# Patient Record
Sex: Male | Born: 1950 | Race: White | Hispanic: No | Smoking: Former smoker
Health system: Southern US, Community
[De-identification: ages and names within clinical notes are randomized; demographics above are authoritative.]

## PROBLEM LIST (undated history)

## (undated) DIAGNOSIS — S58111A Complete traumatic amputation at level between elbow and wrist, right arm, initial encounter: Secondary | ICD-10-CM

## (undated) DIAGNOSIS — E119 Type 2 diabetes mellitus without complications: Secondary | ICD-10-CM

## (undated) DIAGNOSIS — G8929 Other chronic pain: Secondary | ICD-10-CM

## (undated) DIAGNOSIS — M545 Low back pain, unspecified: Secondary | ICD-10-CM

## (undated) DIAGNOSIS — F209 Schizophrenia, unspecified: Secondary | ICD-10-CM

## (undated) DIAGNOSIS — I10 Essential (primary) hypertension: Secondary | ICD-10-CM

## (undated) DIAGNOSIS — B1920 Unspecified viral hepatitis C without hepatic coma: Secondary | ICD-10-CM

## (undated) DIAGNOSIS — W19XXXA Unspecified fall, initial encounter: Secondary | ICD-10-CM

## (undated) DIAGNOSIS — R296 Repeated falls: Secondary | ICD-10-CM

## (undated) DIAGNOSIS — R29898 Other symptoms and signs involving the musculoskeletal system: Secondary | ICD-10-CM

## (undated) DIAGNOSIS — G629 Polyneuropathy, unspecified: Secondary | ICD-10-CM

## (undated) HISTORY — DX: Essential (primary) hypertension: I10

## (undated) HISTORY — PX: SPLENECTOMY: SUR1306

## (undated) HISTORY — PX: TOE SURGERY: SHX1073

## (undated) HISTORY — PX: SHOULDER FUSION: SUR625

## (undated) HISTORY — DX: Polyneuropathy, unspecified: G62.9

## (undated) HISTORY — PX: ELBOW SURGERY: SHX618

## (undated) HISTORY — DX: Other chronic pain: G89.29

## (undated) HISTORY — PX: ARM AMPUTATION: SUR21

## (undated) HISTORY — DX: Unspecified viral hepatitis C without hepatic coma: B19.20

## (undated) HISTORY — PX: JOINT REPLACEMENT: SHX530

## (undated) HISTORY — PX: ABDOMINAL SURGERY: SHX537

---

## 2003-12-13 ENCOUNTER — Other Ambulatory Visit: Payer: Self-pay

## 2003-12-14 ENCOUNTER — Other Ambulatory Visit: Payer: Self-pay

## 2004-07-05 ENCOUNTER — Other Ambulatory Visit: Payer: Self-pay

## 2004-07-12 ENCOUNTER — Inpatient Hospital Stay: Payer: Self-pay | Admitting: Unknown Physician Specialty

## 2004-12-03 ENCOUNTER — Other Ambulatory Visit: Payer: Self-pay

## 2004-12-10 ENCOUNTER — Inpatient Hospital Stay: Payer: Self-pay | Admitting: Unknown Physician Specialty

## 2005-05-29 ENCOUNTER — Other Ambulatory Visit: Payer: Self-pay

## 2005-06-03 ENCOUNTER — Inpatient Hospital Stay: Payer: Self-pay | Admitting: Unknown Physician Specialty

## 2006-08-27 ENCOUNTER — Emergency Department (HOSPITAL_COMMUNITY): Admission: EM | Admit: 2006-08-27 | Discharge: 2006-08-27 | Payer: Self-pay | Admitting: Family Medicine

## 2006-12-04 ENCOUNTER — Emergency Department (HOSPITAL_COMMUNITY): Admission: EM | Admit: 2006-12-04 | Discharge: 2006-12-04 | Payer: Self-pay | Admitting: Emergency Medicine

## 2008-07-21 ENCOUNTER — Ambulatory Visit: Payer: Self-pay | Admitting: Unknown Physician Specialty

## 2008-07-25 ENCOUNTER — Ambulatory Visit: Payer: Self-pay | Admitting: Unknown Physician Specialty

## 2008-11-13 ENCOUNTER — Emergency Department: Payer: Self-pay | Admitting: Emergency Medicine

## 2008-11-14 ENCOUNTER — Ambulatory Visit: Payer: Self-pay | Admitting: Gastroenterology

## 2008-12-18 ENCOUNTER — Ambulatory Visit: Payer: Self-pay | Admitting: Internal Medicine

## 2008-12-29 ENCOUNTER — Ambulatory Visit: Payer: Self-pay | Admitting: Gastroenterology

## 2009-01-12 ENCOUNTER — Ambulatory Visit: Payer: Self-pay | Admitting: Internal Medicine

## 2009-01-18 ENCOUNTER — Ambulatory Visit: Payer: Self-pay | Admitting: Internal Medicine

## 2009-03-13 ENCOUNTER — Emergency Department: Payer: Self-pay | Admitting: Emergency Medicine

## 2009-09-19 ENCOUNTER — Ambulatory Visit: Payer: Self-pay | Admitting: Gastroenterology

## 2009-09-19 LAB — HM COLONOSCOPY

## 2010-06-27 ENCOUNTER — Emergency Department: Payer: Self-pay | Admitting: Emergency Medicine

## 2011-02-12 ENCOUNTER — Emergency Department: Payer: Self-pay | Admitting: Emergency Medicine

## 2011-05-23 DIAGNOSIS — G5600 Carpal tunnel syndrome, unspecified upper limb: Secondary | ICD-10-CM | POA: Diagnosis not present

## 2011-05-31 DIAGNOSIS — G5600 Carpal tunnel syndrome, unspecified upper limb: Secondary | ICD-10-CM | POA: Diagnosis not present

## 2011-06-12 DIAGNOSIS — G5600 Carpal tunnel syndrome, unspecified upper limb: Secondary | ICD-10-CM | POA: Diagnosis not present

## 2011-06-12 DIAGNOSIS — M25549 Pain in joints of unspecified hand: Secondary | ICD-10-CM | POA: Diagnosis not present

## 2011-07-22 DIAGNOSIS — M2550 Pain in unspecified joint: Secondary | ICD-10-CM | POA: Diagnosis not present

## 2011-08-22 DIAGNOSIS — G5600 Carpal tunnel syndrome, unspecified upper limb: Secondary | ICD-10-CM | POA: Diagnosis not present

## 2011-10-08 DIAGNOSIS — F29 Unspecified psychosis not due to a substance or known physiological condition: Secondary | ICD-10-CM | POA: Diagnosis not present

## 2011-10-08 DIAGNOSIS — F308 Other manic episodes: Secondary | ICD-10-CM | POA: Diagnosis not present

## 2011-10-08 DIAGNOSIS — G8929 Other chronic pain: Secondary | ICD-10-CM | POA: Diagnosis not present

## 2011-10-08 DIAGNOSIS — I10 Essential (primary) hypertension: Secondary | ICD-10-CM | POA: Diagnosis not present

## 2011-10-08 DIAGNOSIS — Z9119 Patient's noncompliance with other medical treatment and regimen: Secondary | ICD-10-CM | POA: Diagnosis not present

## 2011-10-08 DIAGNOSIS — F200 Paranoid schizophrenia: Secondary | ICD-10-CM | POA: Diagnosis not present

## 2011-10-08 DIAGNOSIS — S48119A Complete traumatic amputation at level between unspecified shoulder and elbow, initial encounter: Secondary | ICD-10-CM | POA: Diagnosis not present

## 2011-10-08 DIAGNOSIS — F259 Schizoaffective disorder, unspecified: Secondary | ICD-10-CM | POA: Diagnosis not present

## 2011-10-21 DIAGNOSIS — M2550 Pain in unspecified joint: Secondary | ICD-10-CM | POA: Diagnosis not present

## 2011-10-23 DIAGNOSIS — F29 Unspecified psychosis not due to a substance or known physiological condition: Secondary | ICD-10-CM | POA: Diagnosis not present

## 2011-10-25 ENCOUNTER — Emergency Department: Payer: Self-pay | Admitting: *Deleted

## 2011-10-25 DIAGNOSIS — R6889 Other general symptoms and signs: Secondary | ICD-10-CM | POA: Diagnosis not present

## 2011-10-25 LAB — COMPREHENSIVE METABOLIC PANEL
Alkaline Phosphatase: 36 U/L — ABNORMAL LOW (ref 50–136)
BUN: 19 mg/dL — ABNORMAL HIGH (ref 7–18)
Co2: 25 mmol/L (ref 21–32)
EGFR (African American): >60
Potassium: 3.3 mmol/L — ABNORMAL LOW (ref 3.5–5.1)
SGOT(AST): 39 U/L — ABNORMAL HIGH (ref 15–37)

## 2011-10-25 LAB — CBC WITH DIFFERENTIAL/PLATELET
Basophil #: 0 10*3/uL (ref 0.0–0.1)
Eosinophil #: 0 10*3/uL (ref 0.0–0.7)
HCT: 38 % — ABNORMAL LOW (ref 40.0–52.0)
Lymphocyte #: 0.4 10*3/uL — ABNORMAL LOW (ref 1.0–3.6)
Monocyte #: 0.9 x10 3/mm (ref 0.2–1.0)
Monocyte %: 3.8 %
Neutrophil #: 23 10*3/uL — ABNORMAL HIGH (ref 1.4–6.5)
Neutrophil %: 94.2 %
Platelet: 241 10*3/uL (ref 150–440)
RDW: 13.2 % (ref 11.5–14.5)
WBC: 24.4 10*3/uL — ABNORMAL HIGH (ref 3.8–10.6)

## 2011-10-25 LAB — URINALYSIS, COMPLETE
Bacteria: NONE SEEN
Blood: NEGATIVE
Ketone: NEGATIVE
Nitrite: NEGATIVE
Ph: 5 (ref 4.5–8.0)
RBC,UR: <1 /HPF (ref 0–5)
WBC UR: NONE SEEN /HPF (ref 0–5)

## 2011-10-25 LAB — TROPONIN I: Troponin-I: <0.02 ng/mL

## 2011-10-28 ENCOUNTER — Emergency Department: Payer: Self-pay | Admitting: Emergency Medicine

## 2011-10-28 DIAGNOSIS — IMO0002 Reserved for concepts with insufficient information to code with codable children: Secondary | ICD-10-CM | POA: Diagnosis not present

## 2011-10-28 DIAGNOSIS — M545 Low back pain: Secondary | ICD-10-CM | POA: Diagnosis not present

## 2011-10-28 DIAGNOSIS — R6889 Other general symptoms and signs: Secondary | ICD-10-CM | POA: Diagnosis not present

## 2011-10-28 DIAGNOSIS — Z79899 Other long term (current) drug therapy: Secondary | ICD-10-CM | POA: Diagnosis not present

## 2011-10-28 DIAGNOSIS — M129 Arthropathy, unspecified: Secondary | ICD-10-CM | POA: Diagnosis not present

## 2011-11-11 DIAGNOSIS — S32009A Unspecified fracture of unspecified lumbar vertebra, initial encounter for closed fracture: Secondary | ICD-10-CM | POA: Diagnosis not present

## 2011-11-11 DIAGNOSIS — M19019 Primary osteoarthritis, unspecified shoulder: Secondary | ICD-10-CM | POA: Diagnosis not present

## 2011-12-09 DIAGNOSIS — M19019 Primary osteoarthritis, unspecified shoulder: Secondary | ICD-10-CM | POA: Diagnosis not present

## 2011-12-09 DIAGNOSIS — S32009A Unspecified fracture of unspecified lumbar vertebra, initial encounter for closed fracture: Secondary | ICD-10-CM | POA: Diagnosis not present

## 2012-01-06 DIAGNOSIS — M75120 Complete rotator cuff tear or rupture of unspecified shoulder, not specified as traumatic: Secondary | ICD-10-CM | POA: Diagnosis not present

## 2012-01-06 DIAGNOSIS — IMO0002 Reserved for concepts with insufficient information to code with codable children: Secondary | ICD-10-CM | POA: Diagnosis not present

## 2012-02-10 DIAGNOSIS — M549 Dorsalgia, unspecified: Secondary | ICD-10-CM | POA: Insufficient documentation

## 2012-02-10 DIAGNOSIS — S22009A Unspecified fracture of unspecified thoracic vertebra, initial encounter for closed fracture: Secondary | ICD-10-CM | POA: Diagnosis not present

## 2012-02-10 DIAGNOSIS — M48061 Spinal stenosis, lumbar region without neurogenic claudication: Secondary | ICD-10-CM | POA: Diagnosis not present

## 2012-02-10 DIAGNOSIS — M5030 Other cervical disc degeneration, unspecified cervical region: Secondary | ICD-10-CM | POA: Diagnosis not present

## 2012-02-10 DIAGNOSIS — M5126 Other intervertebral disc displacement, lumbar region: Secondary | ICD-10-CM | POA: Diagnosis not present

## 2012-02-10 DIAGNOSIS — M5137 Other intervertebral disc degeneration, lumbosacral region: Secondary | ICD-10-CM | POA: Diagnosis not present

## 2012-02-17 DIAGNOSIS — M4800 Spinal stenosis, site unspecified: Secondary | ICD-10-CM | POA: Diagnosis not present

## 2012-02-21 DIAGNOSIS — Z8739 Personal history of other diseases of the musculoskeletal system and connective tissue: Secondary | ICD-10-CM | POA: Diagnosis not present

## 2012-02-21 DIAGNOSIS — S32009A Unspecified fracture of unspecified lumbar vertebra, initial encounter for closed fracture: Secondary | ICD-10-CM | POA: Diagnosis not present

## 2012-02-21 DIAGNOSIS — M549 Dorsalgia, unspecified: Secondary | ICD-10-CM | POA: Diagnosis not present

## 2012-02-26 DIAGNOSIS — M5412 Radiculopathy, cervical region: Secondary | ICD-10-CM | POA: Diagnosis not present

## 2012-02-26 DIAGNOSIS — Z79899 Other long term (current) drug therapy: Secondary | ICD-10-CM | POA: Diagnosis not present

## 2012-02-26 DIAGNOSIS — M47812 Spondylosis without myelopathy or radiculopathy, cervical region: Secondary | ICD-10-CM | POA: Diagnosis not present

## 2012-02-28 DIAGNOSIS — S32009A Unspecified fracture of unspecified lumbar vertebra, initial encounter for closed fracture: Secondary | ICD-10-CM | POA: Diagnosis not present

## 2012-02-28 DIAGNOSIS — M549 Dorsalgia, unspecified: Secondary | ICD-10-CM | POA: Diagnosis not present

## 2012-02-28 DIAGNOSIS — Z8739 Personal history of other diseases of the musculoskeletal system and connective tissue: Secondary | ICD-10-CM | POA: Diagnosis not present

## 2012-03-20 DIAGNOSIS — M545 Low back pain: Secondary | ICD-10-CM | POA: Diagnosis not present

## 2012-03-20 DIAGNOSIS — M542 Cervicalgia: Secondary | ICD-10-CM | POA: Diagnosis not present

## 2012-03-26 DIAGNOSIS — M19019 Primary osteoarthritis, unspecified shoulder: Secondary | ICD-10-CM | POA: Diagnosis not present

## 2012-03-26 DIAGNOSIS — S43429A Sprain of unspecified rotator cuff capsule, initial encounter: Secondary | ICD-10-CM | POA: Diagnosis not present

## 2012-03-26 DIAGNOSIS — M25519 Pain in unspecified shoulder: Secondary | ICD-10-CM | POA: Diagnosis not present

## 2012-03-26 DIAGNOSIS — M259 Joint disorder, unspecified: Secondary | ICD-10-CM | POA: Insufficient documentation

## 2012-06-08 DIAGNOSIS — Z0000 Encounter for general adult medical examination without abnormal findings: Secondary | ICD-10-CM | POA: Diagnosis not present

## 2012-06-08 DIAGNOSIS — I10 Essential (primary) hypertension: Secondary | ICD-10-CM | POA: Diagnosis not present

## 2012-06-08 DIAGNOSIS — G8929 Other chronic pain: Secondary | ICD-10-CM | POA: Diagnosis not present

## 2012-06-08 DIAGNOSIS — Z125 Encounter for screening for malignant neoplasm of prostate: Secondary | ICD-10-CM | POA: Diagnosis not present

## 2012-06-08 LAB — PSA

## 2012-06-09 DIAGNOSIS — Z23 Encounter for immunization: Secondary | ICD-10-CM | POA: Diagnosis not present

## 2012-06-11 DIAGNOSIS — IMO0001 Reserved for inherently not codable concepts without codable children: Secondary | ICD-10-CM | POA: Diagnosis not present

## 2012-06-11 DIAGNOSIS — M1990 Unspecified osteoarthritis, unspecified site: Secondary | ICD-10-CM | POA: Diagnosis not present

## 2012-06-11 DIAGNOSIS — S48019A Complete traumatic amputation at unspecified shoulder joint, initial encounter: Secondary | ICD-10-CM | POA: Diagnosis not present

## 2012-06-11 DIAGNOSIS — I10 Essential (primary) hypertension: Secondary | ICD-10-CM | POA: Diagnosis not present

## 2012-06-11 DIAGNOSIS — M25569 Pain in unspecified knee: Secondary | ICD-10-CM | POA: Diagnosis not present

## 2012-06-11 DIAGNOSIS — M549 Dorsalgia, unspecified: Secondary | ICD-10-CM | POA: Diagnosis not present

## 2012-06-11 DIAGNOSIS — R262 Difficulty in walking, not elsewhere classified: Secondary | ICD-10-CM | POA: Diagnosis not present

## 2012-06-11 DIAGNOSIS — G8929 Other chronic pain: Secondary | ICD-10-CM | POA: Diagnosis not present

## 2012-06-11 DIAGNOSIS — Z9181 History of falling: Secondary | ICD-10-CM | POA: Diagnosis not present

## 2012-06-11 DIAGNOSIS — M542 Cervicalgia: Secondary | ICD-10-CM | POA: Diagnosis not present

## 2012-06-15 DIAGNOSIS — IMO0001 Reserved for inherently not codable concepts without codable children: Secondary | ICD-10-CM | POA: Diagnosis not present

## 2012-06-15 DIAGNOSIS — M549 Dorsalgia, unspecified: Secondary | ICD-10-CM | POA: Diagnosis not present

## 2012-06-15 DIAGNOSIS — M25569 Pain in unspecified knee: Secondary | ICD-10-CM | POA: Diagnosis not present

## 2012-06-15 DIAGNOSIS — M542 Cervicalgia: Secondary | ICD-10-CM | POA: Diagnosis not present

## 2012-06-15 DIAGNOSIS — G8929 Other chronic pain: Secondary | ICD-10-CM | POA: Diagnosis not present

## 2012-06-15 DIAGNOSIS — R262 Difficulty in walking, not elsewhere classified: Secondary | ICD-10-CM | POA: Diagnosis not present

## 2012-06-18 DIAGNOSIS — R262 Difficulty in walking, not elsewhere classified: Secondary | ICD-10-CM | POA: Diagnosis not present

## 2012-06-18 DIAGNOSIS — IMO0001 Reserved for inherently not codable concepts without codable children: Secondary | ICD-10-CM | POA: Diagnosis not present

## 2012-06-18 DIAGNOSIS — M25569 Pain in unspecified knee: Secondary | ICD-10-CM | POA: Diagnosis not present

## 2012-06-18 DIAGNOSIS — M542 Cervicalgia: Secondary | ICD-10-CM | POA: Diagnosis not present

## 2012-06-19 DIAGNOSIS — R262 Difficulty in walking, not elsewhere classified: Secondary | ICD-10-CM | POA: Diagnosis not present

## 2012-06-19 DIAGNOSIS — IMO0001 Reserved for inherently not codable concepts without codable children: Secondary | ICD-10-CM | POA: Diagnosis not present

## 2012-06-19 DIAGNOSIS — M25569 Pain in unspecified knee: Secondary | ICD-10-CM | POA: Diagnosis not present

## 2012-06-19 DIAGNOSIS — G8929 Other chronic pain: Secondary | ICD-10-CM | POA: Diagnosis not present

## 2012-06-19 DIAGNOSIS — M549 Dorsalgia, unspecified: Secondary | ICD-10-CM | POA: Diagnosis not present

## 2012-06-19 DIAGNOSIS — M542 Cervicalgia: Secondary | ICD-10-CM | POA: Diagnosis not present

## 2012-06-23 DIAGNOSIS — R262 Difficulty in walking, not elsewhere classified: Secondary | ICD-10-CM | POA: Diagnosis not present

## 2012-06-23 DIAGNOSIS — IMO0001 Reserved for inherently not codable concepts without codable children: Secondary | ICD-10-CM | POA: Diagnosis not present

## 2012-06-23 DIAGNOSIS — M549 Dorsalgia, unspecified: Secondary | ICD-10-CM | POA: Diagnosis not present

## 2012-06-23 DIAGNOSIS — G8929 Other chronic pain: Secondary | ICD-10-CM | POA: Diagnosis not present

## 2012-06-23 DIAGNOSIS — M25569 Pain in unspecified knee: Secondary | ICD-10-CM | POA: Diagnosis not present

## 2012-06-23 DIAGNOSIS — M542 Cervicalgia: Secondary | ICD-10-CM | POA: Diagnosis not present

## 2012-06-26 DIAGNOSIS — M542 Cervicalgia: Secondary | ICD-10-CM | POA: Diagnosis not present

## 2012-06-26 DIAGNOSIS — M549 Dorsalgia, unspecified: Secondary | ICD-10-CM | POA: Diagnosis not present

## 2012-06-26 DIAGNOSIS — G8929 Other chronic pain: Secondary | ICD-10-CM | POA: Diagnosis not present

## 2012-06-26 DIAGNOSIS — R262 Difficulty in walking, not elsewhere classified: Secondary | ICD-10-CM | POA: Diagnosis not present

## 2012-06-26 DIAGNOSIS — M25569 Pain in unspecified knee: Secondary | ICD-10-CM | POA: Diagnosis not present

## 2012-06-26 DIAGNOSIS — IMO0001 Reserved for inherently not codable concepts without codable children: Secondary | ICD-10-CM | POA: Diagnosis not present

## 2012-06-30 DIAGNOSIS — R262 Difficulty in walking, not elsewhere classified: Secondary | ICD-10-CM | POA: Diagnosis not present

## 2012-06-30 DIAGNOSIS — M25569 Pain in unspecified knee: Secondary | ICD-10-CM | POA: Diagnosis not present

## 2012-06-30 DIAGNOSIS — M542 Cervicalgia: Secondary | ICD-10-CM | POA: Diagnosis not present

## 2012-06-30 DIAGNOSIS — IMO0001 Reserved for inherently not codable concepts without codable children: Secondary | ICD-10-CM | POA: Diagnosis not present

## 2012-06-30 DIAGNOSIS — M549 Dorsalgia, unspecified: Secondary | ICD-10-CM | POA: Diagnosis not present

## 2012-06-30 DIAGNOSIS — G8929 Other chronic pain: Secondary | ICD-10-CM | POA: Diagnosis not present

## 2012-07-07 DIAGNOSIS — G8929 Other chronic pain: Secondary | ICD-10-CM | POA: Diagnosis not present

## 2012-07-07 DIAGNOSIS — IMO0001 Reserved for inherently not codable concepts without codable children: Secondary | ICD-10-CM | POA: Diagnosis not present

## 2012-07-07 DIAGNOSIS — M549 Dorsalgia, unspecified: Secondary | ICD-10-CM | POA: Diagnosis not present

## 2012-07-07 DIAGNOSIS — M25569 Pain in unspecified knee: Secondary | ICD-10-CM | POA: Diagnosis not present

## 2012-07-07 DIAGNOSIS — M542 Cervicalgia: Secondary | ICD-10-CM | POA: Diagnosis not present

## 2012-07-07 DIAGNOSIS — R262 Difficulty in walking, not elsewhere classified: Secondary | ICD-10-CM | POA: Diagnosis not present

## 2012-07-09 DIAGNOSIS — M25569 Pain in unspecified knee: Secondary | ICD-10-CM | POA: Diagnosis not present

## 2012-07-21 DIAGNOSIS — M25569 Pain in unspecified knee: Secondary | ICD-10-CM | POA: Diagnosis not present

## 2012-07-21 DIAGNOSIS — G8929 Other chronic pain: Secondary | ICD-10-CM | POA: Diagnosis not present

## 2012-09-02 DIAGNOSIS — R7989 Other specified abnormal findings of blood chemistry: Secondary | ICD-10-CM | POA: Diagnosis not present

## 2012-09-07 DIAGNOSIS — M25569 Pain in unspecified knee: Secondary | ICD-10-CM | POA: Diagnosis not present

## 2012-09-07 DIAGNOSIS — Z96659 Presence of unspecified artificial knee joint: Secondary | ICD-10-CM | POA: Diagnosis not present

## 2012-09-07 DIAGNOSIS — Z9660 Presence of unspecified orthopedic joint implant: Secondary | ICD-10-CM | POA: Diagnosis not present

## 2012-10-08 DIAGNOSIS — M79609 Pain in unspecified limb: Secondary | ICD-10-CM | POA: Diagnosis not present

## 2012-10-08 DIAGNOSIS — M19079 Primary osteoarthritis, unspecified ankle and foot: Secondary | ICD-10-CM | POA: Diagnosis not present

## 2012-10-27 DIAGNOSIS — M19079 Primary osteoarthritis, unspecified ankle and foot: Secondary | ICD-10-CM | POA: Diagnosis not present

## 2012-10-27 DIAGNOSIS — S93336A Other dislocation of unspecified foot, initial encounter: Secondary | ICD-10-CM | POA: Diagnosis not present

## 2012-10-27 DIAGNOSIS — IMO0002 Reserved for concepts with insufficient information to code with codable children: Secondary | ICD-10-CM | POA: Diagnosis not present

## 2012-10-27 DIAGNOSIS — M205X9 Other deformities of toe(s) (acquired), unspecified foot: Secondary | ICD-10-CM | POA: Diagnosis not present

## 2012-10-27 DIAGNOSIS — G8918 Other acute postprocedural pain: Secondary | ICD-10-CM | POA: Diagnosis not present

## 2012-10-27 DIAGNOSIS — Z472 Encounter for removal of internal fixation device: Secondary | ICD-10-CM | POA: Diagnosis not present

## 2012-12-03 DIAGNOSIS — M79609 Pain in unspecified limb: Secondary | ICD-10-CM | POA: Diagnosis not present

## 2012-12-03 DIAGNOSIS — Z981 Arthrodesis status: Secondary | ICD-10-CM | POA: Diagnosis not present

## 2012-12-03 DIAGNOSIS — M19079 Primary osteoarthritis, unspecified ankle and foot: Secondary | ICD-10-CM | POA: Diagnosis not present

## 2012-12-21 DIAGNOSIS — L03119 Cellulitis of unspecified part of limb: Secondary | ICD-10-CM | POA: Diagnosis not present

## 2012-12-21 DIAGNOSIS — L02619 Cutaneous abscess of unspecified foot: Secondary | ICD-10-CM | POA: Diagnosis not present

## 2012-12-23 DIAGNOSIS — T8140XA Infection following a procedure, unspecified, initial encounter: Secondary | ICD-10-CM | POA: Diagnosis not present

## 2012-12-23 DIAGNOSIS — M25579 Pain in unspecified ankle and joints of unspecified foot: Secondary | ICD-10-CM | POA: Diagnosis not present

## 2012-12-23 DIAGNOSIS — M2460 Ankylosis, unspecified joint: Secondary | ICD-10-CM | POA: Diagnosis not present

## 2013-01-27 DIAGNOSIS — Z23 Encounter for immunization: Secondary | ICD-10-CM | POA: Diagnosis not present

## 2013-02-17 DIAGNOSIS — R52 Pain, unspecified: Secondary | ICD-10-CM | POA: Diagnosis not present

## 2013-02-17 DIAGNOSIS — S92309A Fracture of unspecified metatarsal bone(s), unspecified foot, initial encounter for closed fracture: Secondary | ICD-10-CM | POA: Diagnosis not present

## 2013-02-17 DIAGNOSIS — IMO0002 Reserved for concepts with insufficient information to code with codable children: Secondary | ICD-10-CM | POA: Diagnosis not present

## 2013-03-19 DIAGNOSIS — R209 Unspecified disturbances of skin sensation: Secondary | ICD-10-CM | POA: Diagnosis not present

## 2013-04-23 DIAGNOSIS — M5430 Sciatica, unspecified side: Secondary | ICD-10-CM | POA: Diagnosis not present

## 2013-04-23 DIAGNOSIS — M542 Cervicalgia: Secondary | ICD-10-CM | POA: Diagnosis not present

## 2013-04-23 DIAGNOSIS — R209 Unspecified disturbances of skin sensation: Secondary | ICD-10-CM | POA: Diagnosis not present

## 2013-04-23 DIAGNOSIS — Z5189 Encounter for other specified aftercare: Secondary | ICD-10-CM | POA: Diagnosis not present

## 2013-07-20 DIAGNOSIS — M47812 Spondylosis without myelopathy or radiculopathy, cervical region: Secondary | ICD-10-CM | POA: Diagnosis not present

## 2013-07-26 DIAGNOSIS — M47812 Spondylosis without myelopathy or radiculopathy, cervical region: Secondary | ICD-10-CM | POA: Diagnosis not present

## 2013-08-12 DIAGNOSIS — M47812 Spondylosis without myelopathy or radiculopathy, cervical region: Secondary | ICD-10-CM | POA: Diagnosis not present

## 2013-08-20 DIAGNOSIS — M47812 Spondylosis without myelopathy or radiculopathy, cervical region: Secondary | ICD-10-CM | POA: Diagnosis not present

## 2013-09-25 IMAGING — CR DG KNEE COMPLETE 4+V*R*
1 series · 4 of 4 positions shown · non-contrast
Comparison: none

REASON FOR EXAM: trauma
COMMENTS:

PROCEDURE:     DXR - DXR KNEE RT COMP WITH OBLIQUES  - October 25, 2011 [DATE]
RESULT:     Comparison:  None

[Series 1: ap · 0.17mm/px · 4 of 4 slices shown]
[im 1/4]
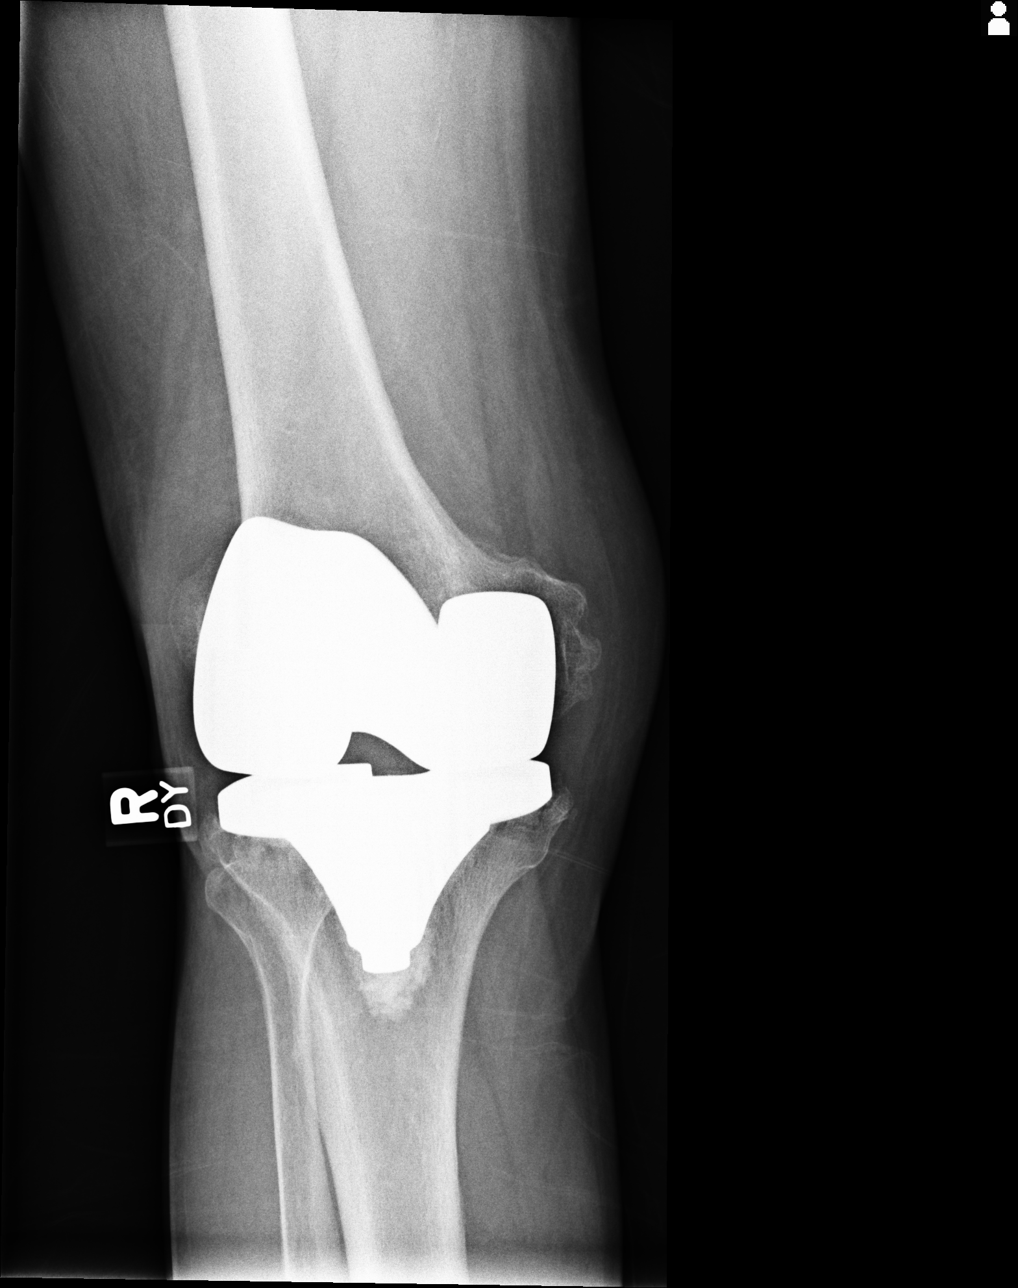
[im 2/4]
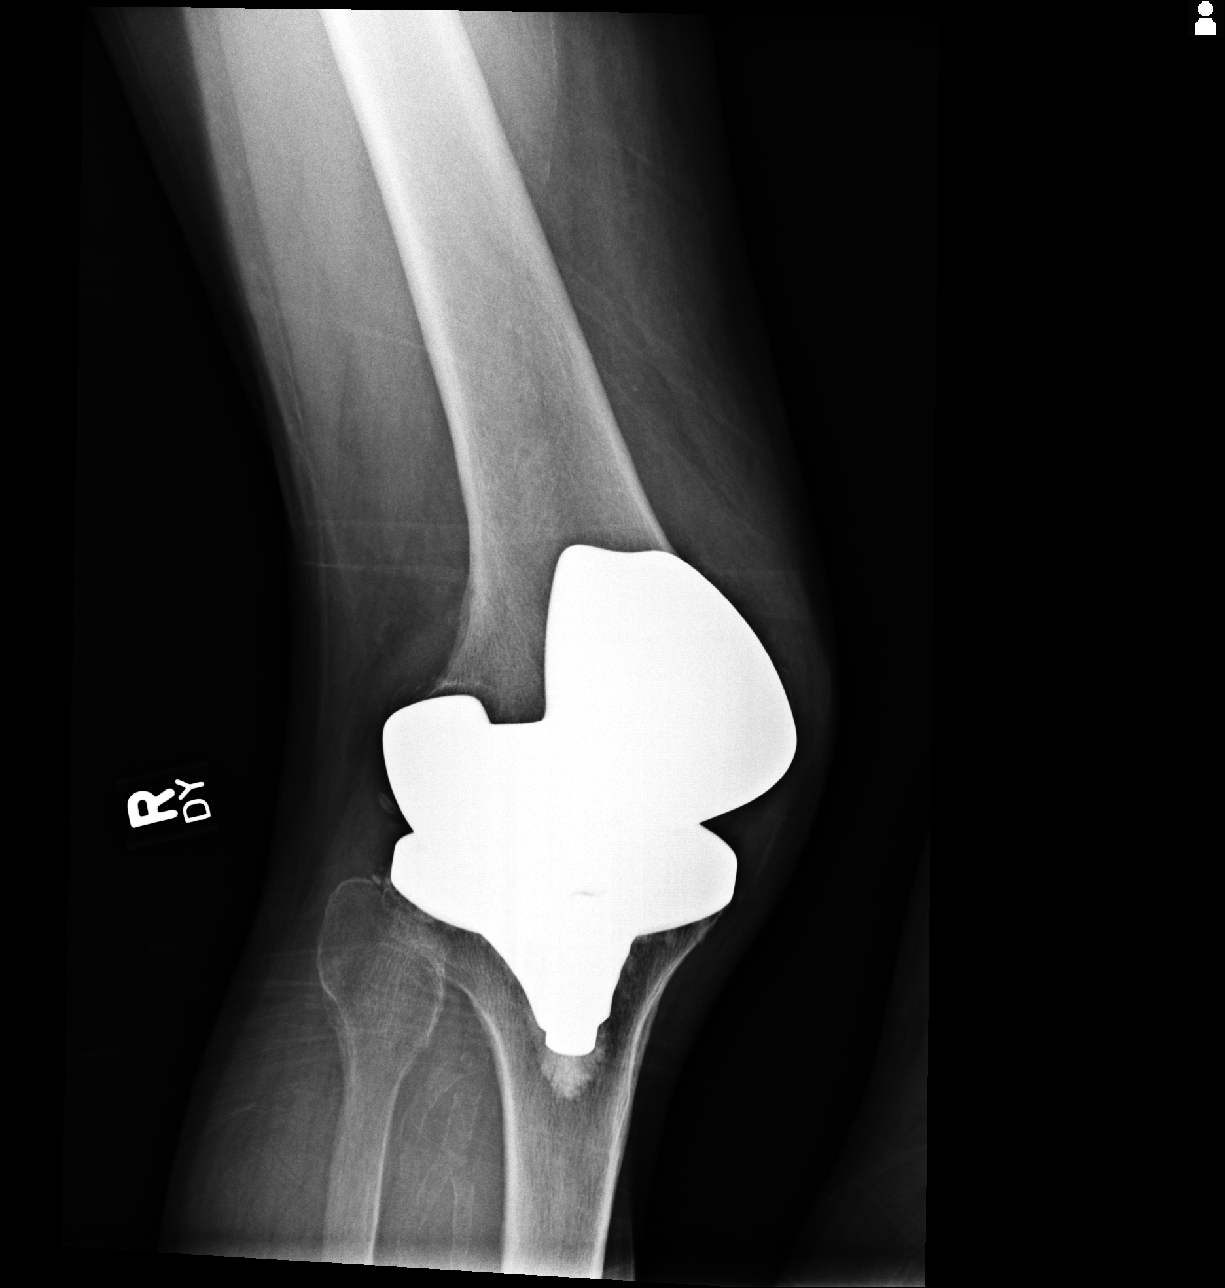
[im 3/4]
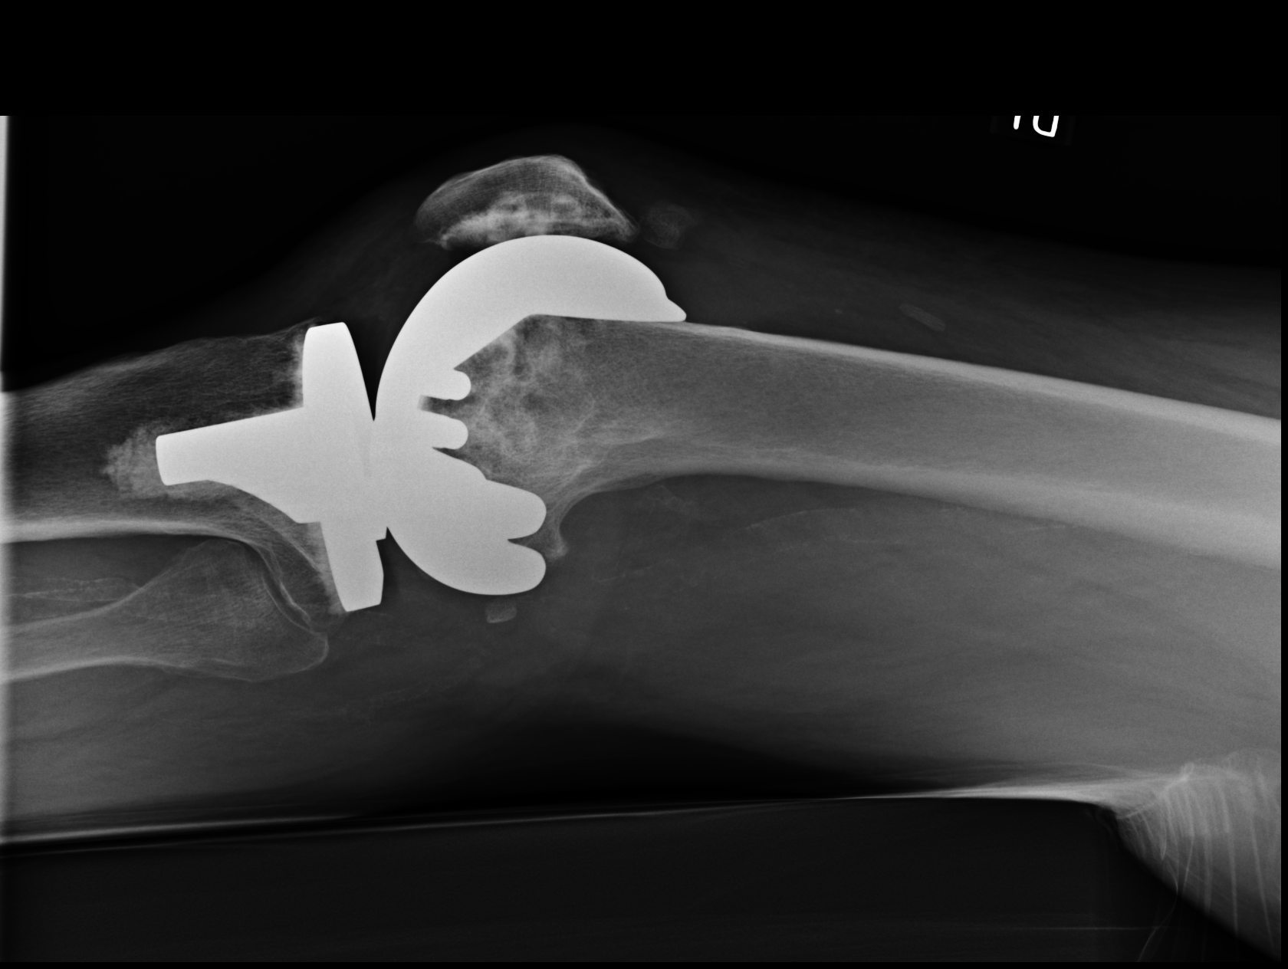
[im 4/4]
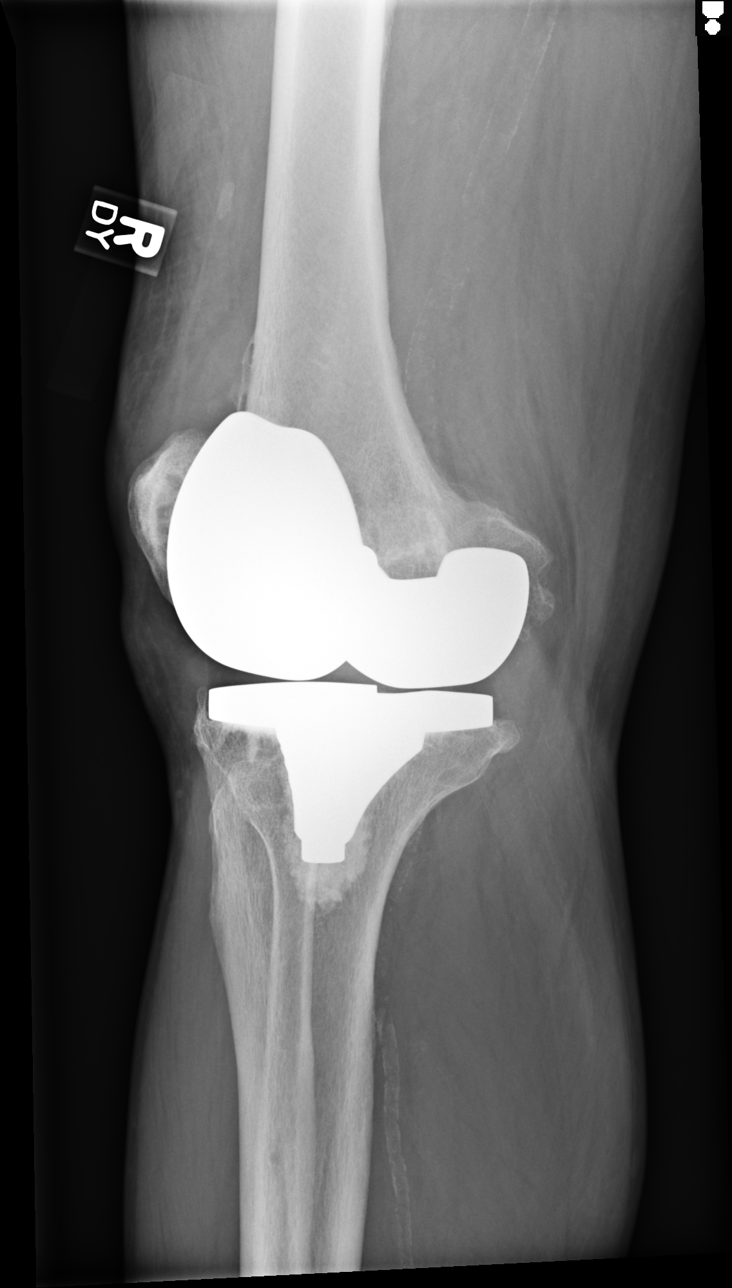

[4 of 4 positions shown; findings below may reference images not displayed]

FINDINGS: 4 views of the right knee demonstrates no acute fracture or dislocation.
There is a total right knee arthroplasty without hardware failure or
complication. There is no significant joint effusion.
IMPRESSION: No acute osseous injury of the right knee.

[REDACTED]

## 2013-09-25 IMAGING — CR PELVIS - 1-2 VIEW
1 series · 2 of 2 positions shown · non-contrast
Comparison: none

REASON FOR EXAM: trauma
COMMENTS:   Bedside (portable):Y

PROCEDURE:     DXR - DXR PELVIS AP ONLY  - October 25, 2011 [DATE]
RESULT:     Comparison: None

[Series 1: ap · 0.17mm/px · 2 of 2 slices shown]
[im 1/2]
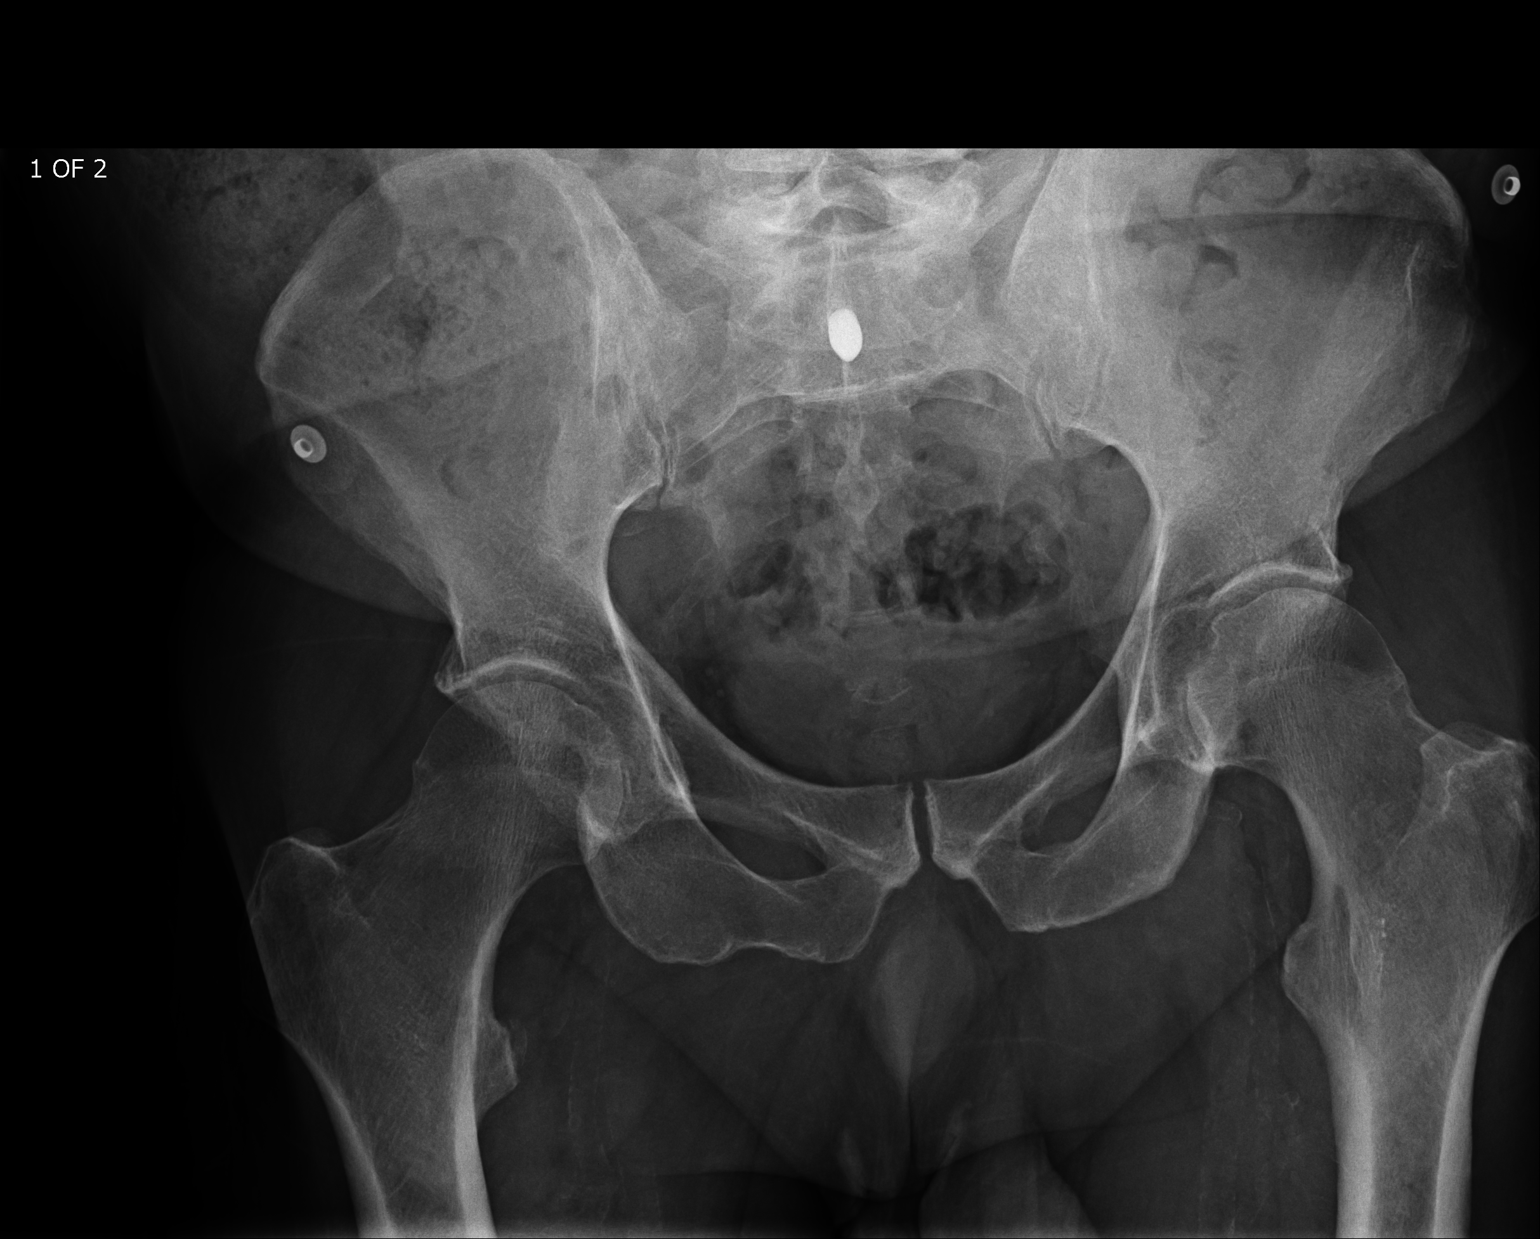
[im 2/2]
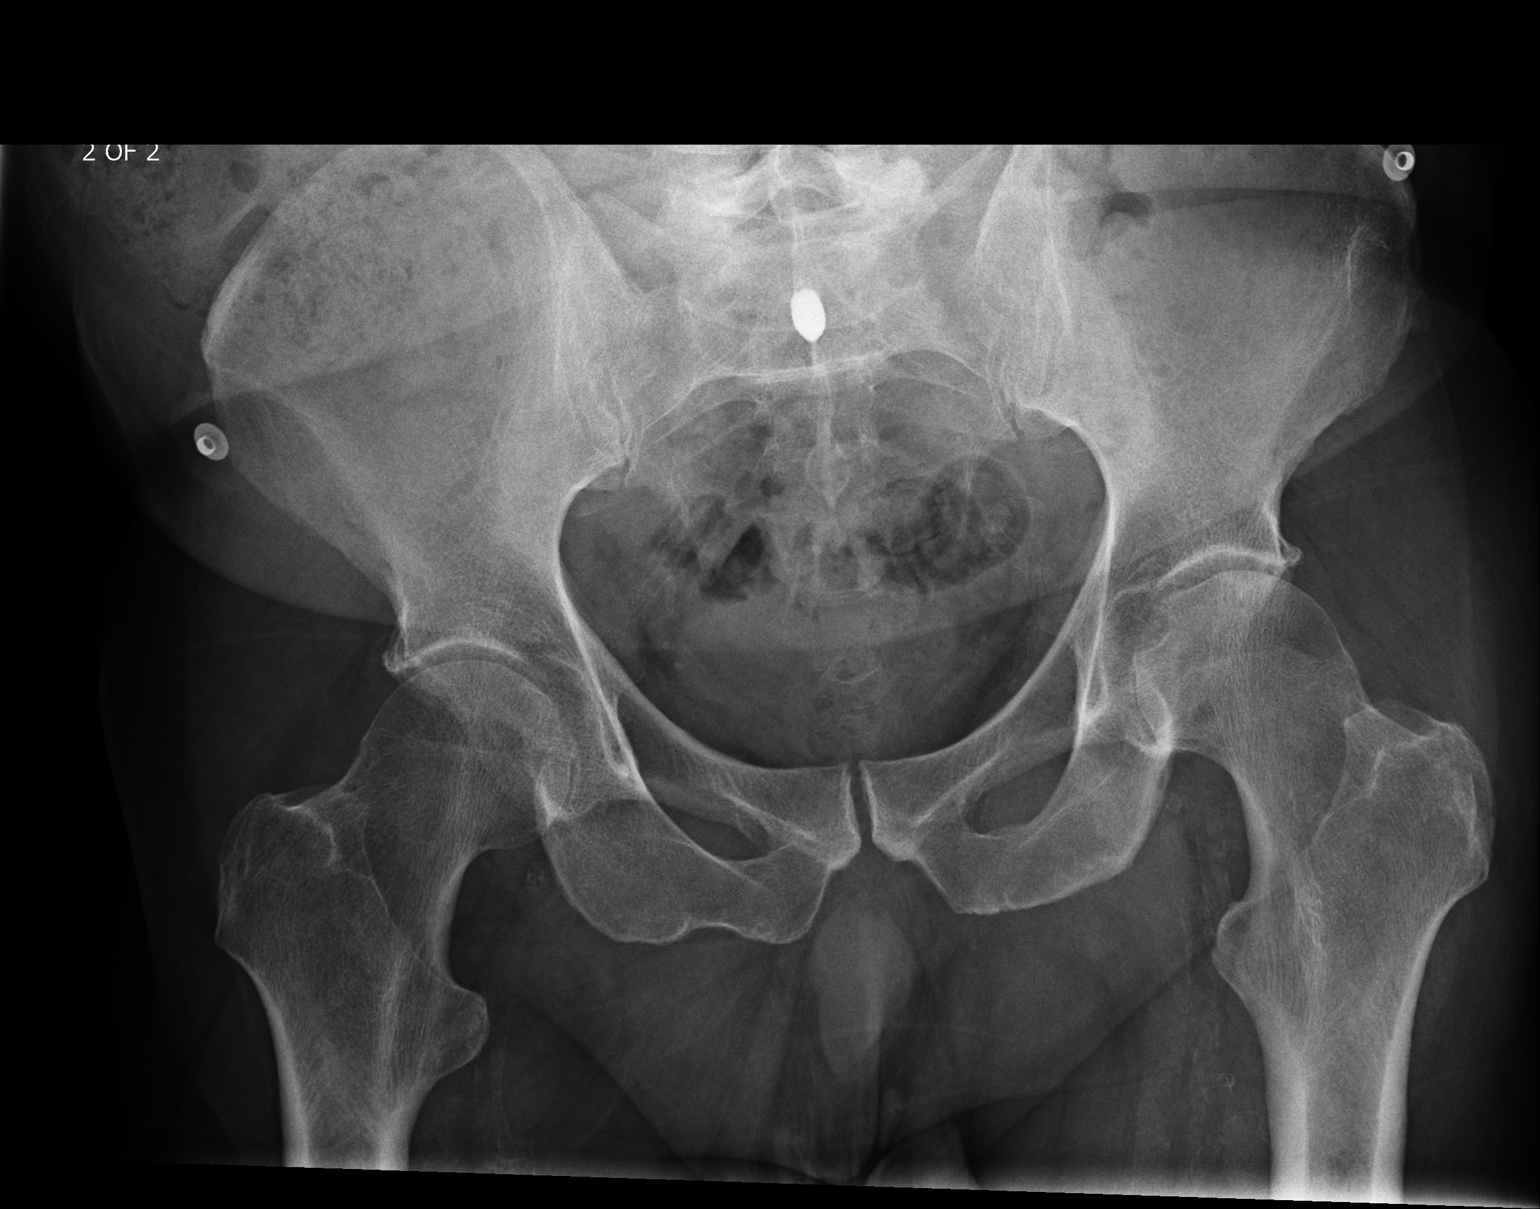

[2 of 2 positions shown; findings below may reference images not displayed]

FINDINGS: AP pelvis demonstrates no fracture or dislocation. The joint spaces are
maintained. The sacroiliac joints are unremarkable. There is a metallic
foreign body in the sacrum.
IMPRESSION: No acute osseous injury of the pelvis.

## 2013-09-25 IMAGING — CT CT CERVICAL SPINE WITHOUT CONTRAST
1 series · 12 of 14 positions shown, 15 images · non-contrast
Comparison: none

REASON FOR EXAM: trauma
COMMENTS:

[Series 6: axial · axial · 0.27mm/px · z∈[-240,-92]mm · 12 of 96 slices shown, 15 images]
[im 8/96  soft-tissue]
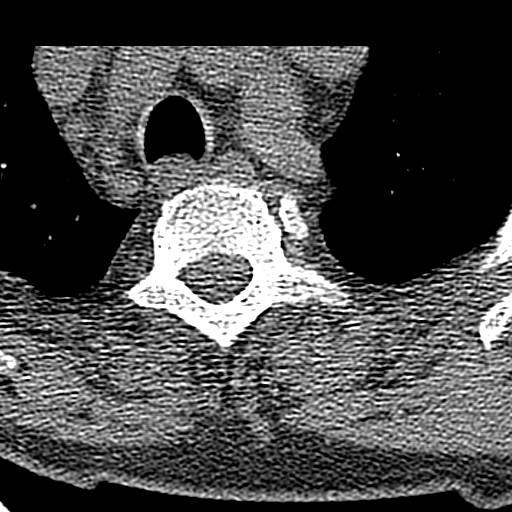
[im 8/96  bone]
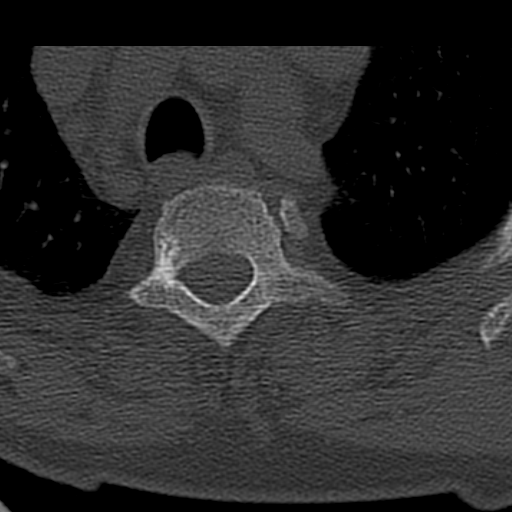
[im 15/96  bone]
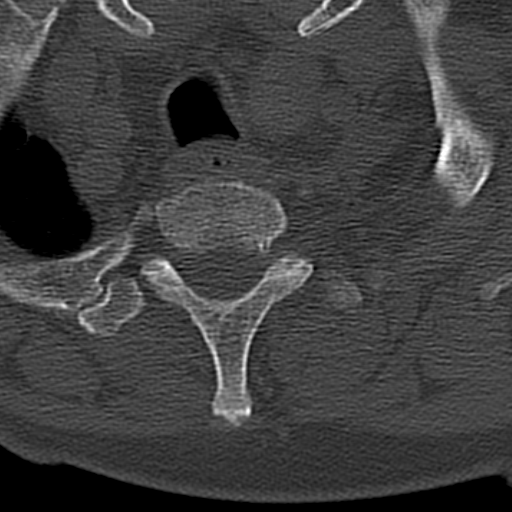
[im 22/96  bone]
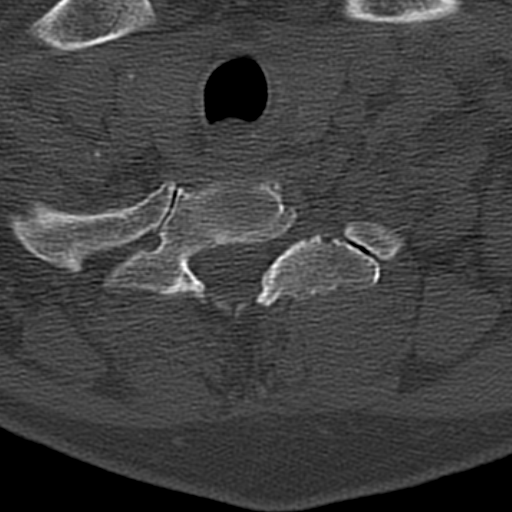
[im 30/96  bone]
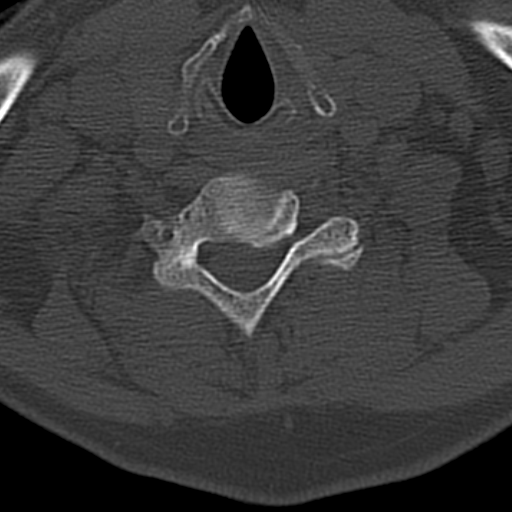
[im 37/96  soft-tissue]
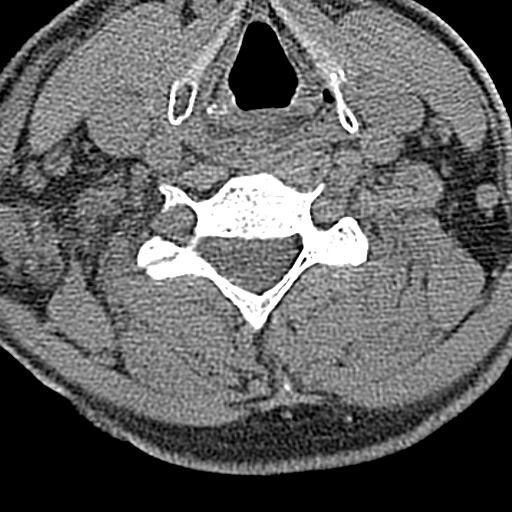
[im 37/96  bone]
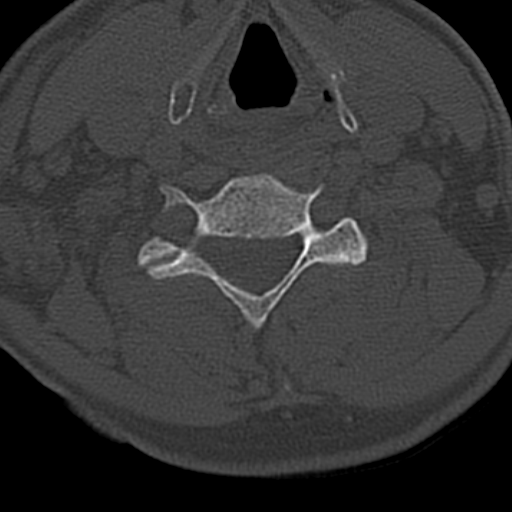
[im 44/96  bone]
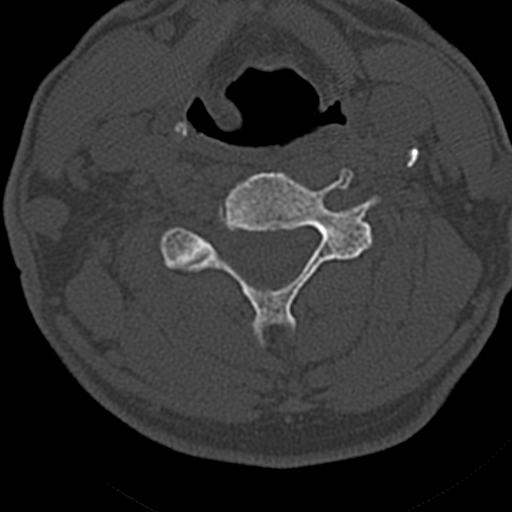
[im 52/96  bone]
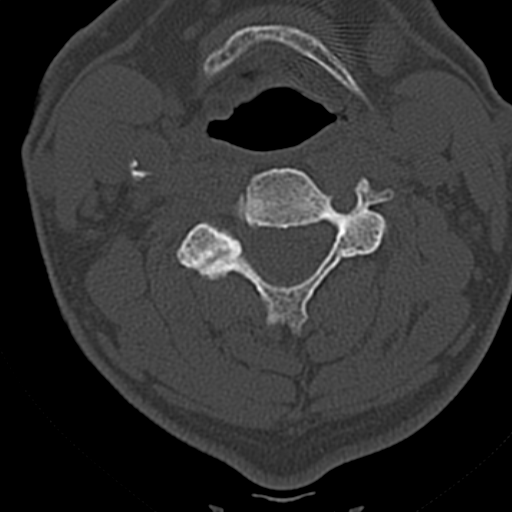
[im 59/96  bone]
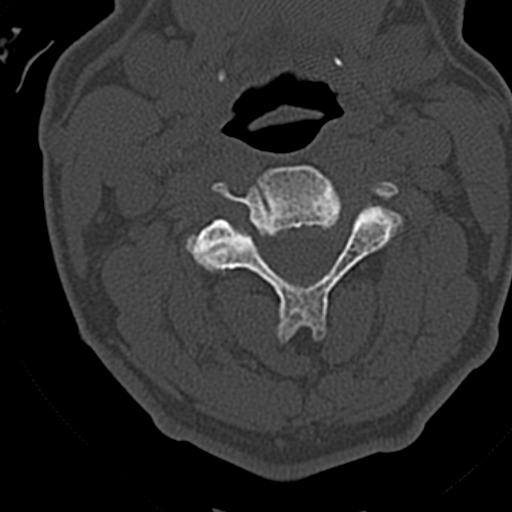
[im 66/96  soft-tissue]
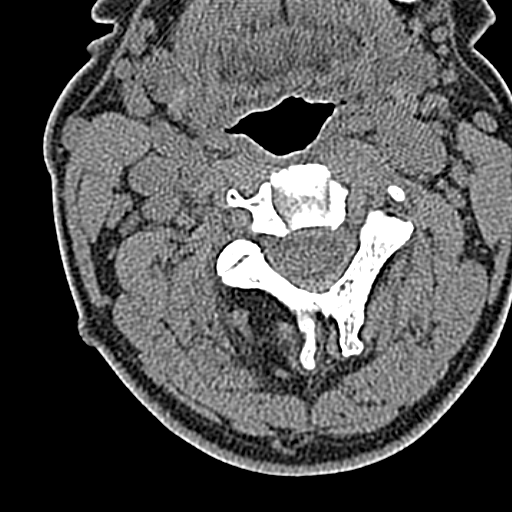
[im 66/96  bone]
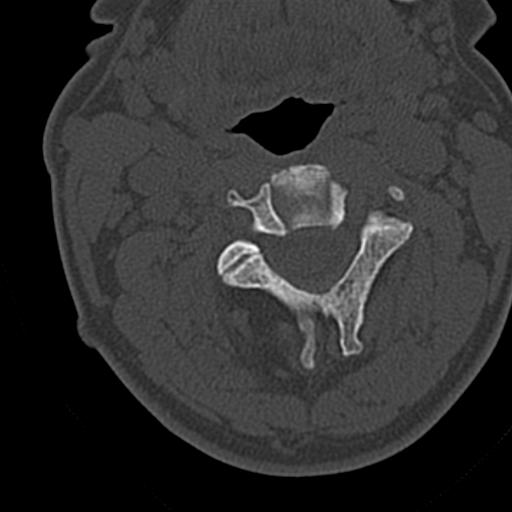
[im 74/96  bone]
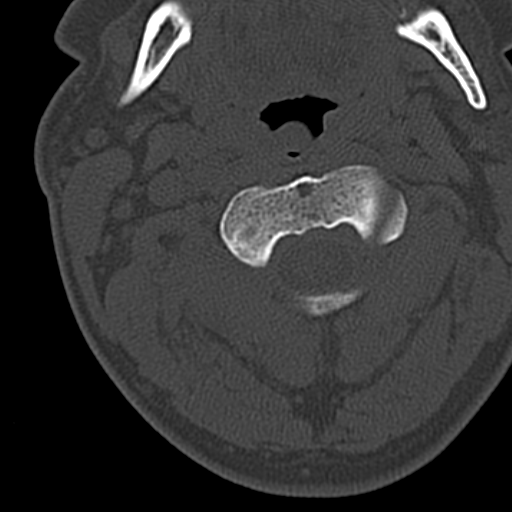
[im 81/96  bone]
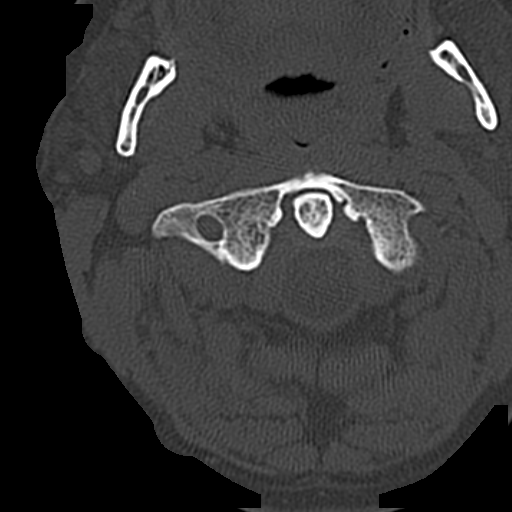
[im 88/96  bone]
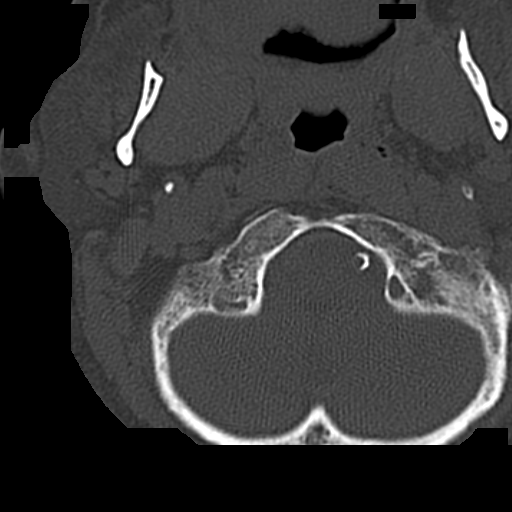

[12 of 14 positions shown; findings below may reference images not displayed]

PROCEDURE:     CT  - CT CERVICAL SPINE WO  - October 25, 2011 [DATE]

RESULT:     Sagittal, axial, and coronal images through the cervical spine
are reviewed. The patient is somewhat obliquely positioned in the scanner.

The cervical vertebral bodies are preserved in height. The intervertebral
disc space heights are well-maintained. The prevertebral soft tissue spaces
appear normal. I do not see evidence of a perched facet. Only mild
degenerative facet joint changes are demonstrated. There is fusion on the
left of the C7- T1 facet. The spinous processes are intact. The bony ring at
each cervical level is intact. The odontoid is intact. The lateral masses of
C1 align normally with those of C2. The pulmonary apices exhibit no acute
abnormality where visualized.
IMPRESSION: There are very mild degenerative changes of the cervical
spine. The patient's neck appears to be turned slightly toward the left.
This may be normal for the patient or could reflect muscle spasm. I do not
see evidence of an acute fracture.

## 2013-10-19 DIAGNOSIS — I83893 Varicose veins of bilateral lower extremities with other complications: Secondary | ICD-10-CM | POA: Diagnosis not present

## 2013-11-04 DIAGNOSIS — I83893 Varicose veins of bilateral lower extremities with other complications: Secondary | ICD-10-CM | POA: Diagnosis not present

## 2013-11-04 DIAGNOSIS — I872 Venous insufficiency (chronic) (peripheral): Secondary | ICD-10-CM | POA: Diagnosis not present

## 2013-11-29 DIAGNOSIS — I129 Hypertensive chronic kidney disease with stage 1 through stage 4 chronic kidney disease, or unspecified chronic kidney disease: Secondary | ICD-10-CM | POA: Diagnosis not present

## 2013-11-29 DIAGNOSIS — I10 Essential (primary) hypertension: Secondary | ICD-10-CM | POA: Diagnosis not present

## 2013-11-29 DIAGNOSIS — G609 Hereditary and idiopathic neuropathy, unspecified: Secondary | ICD-10-CM | POA: Diagnosis not present

## 2013-11-29 DIAGNOSIS — G8929 Other chronic pain: Secondary | ICD-10-CM | POA: Diagnosis not present

## 2013-11-29 DIAGNOSIS — E119 Type 2 diabetes mellitus without complications: Secondary | ICD-10-CM | POA: Diagnosis not present

## 2014-02-09 DIAGNOSIS — Z23 Encounter for immunization: Secondary | ICD-10-CM | POA: Diagnosis not present

## 2014-03-08 DIAGNOSIS — L0291 Cutaneous abscess, unspecified: Secondary | ICD-10-CM | POA: Diagnosis not present

## 2014-03-08 DIAGNOSIS — L02412 Cutaneous abscess of left axilla: Secondary | ICD-10-CM | POA: Diagnosis not present

## 2014-03-08 DIAGNOSIS — L03317 Cellulitis of buttock: Secondary | ICD-10-CM | POA: Diagnosis not present

## 2014-03-08 DIAGNOSIS — L0231 Cutaneous abscess of buttock: Secondary | ICD-10-CM | POA: Diagnosis not present

## 2014-03-08 DIAGNOSIS — F209 Schizophrenia, unspecified: Secondary | ICD-10-CM | POA: Diagnosis not present

## 2014-03-08 LAB — COMPREHENSIVE METABOLIC PANEL
ALBUMIN: 2.8 g/dL — AB (ref 3.4–5.0)
ANION GAP: 8 (ref 7–16)
AST: 24 U/L (ref 15–37)
Alkaline Phosphatase: 47 U/L
BUN: 16 mg/dL (ref 7–18)
Bilirubin,Total: 0.5 mg/dL (ref 0.2–1.0)
CALCIUM: 8.7 mg/dL (ref 8.5–10.1)
CREATININE: 0.68 mg/dL (ref 0.60–1.30)
Chloride: 102 mmol/L (ref 98–107)
Co2: 28 mmol/L (ref 21–32)
Glucose: 114 mg/dL — ABNORMAL HIGH (ref 65–99)
Potassium: 3.5 mmol/L (ref 3.5–5.1)
SGPT (ALT): 20 U/L
SODIUM: 138 mmol/L (ref 136–145)
Total Protein: 7.5 g/dL (ref 6.4–8.2)

## 2014-03-08 LAB — CBC WITH DIFFERENTIAL/PLATELET
Basophil #: 0.2 10*3/uL — ABNORMAL HIGH (ref 0.0–0.1)
Basophil %: 1 %
EOS ABS: 0.2 10*3/uL (ref 0.0–0.7)
EOS PCT: 1.1 %
HCT: 36.2 % — ABNORMAL LOW (ref 40.0–52.0)
HGB: 11.7 g/dL — ABNORMAL LOW (ref 13.0–18.0)
LYMPHS ABS: 1.9 10*3/uL (ref 1.0–3.6)
Lymphocyte %: 11.3 %
MCH: 28.2 pg (ref 26.0–34.0)
MCHC: 32.4 g/dL (ref 32.0–36.0)
MCV: 87 fL (ref 80–100)
MONO ABS: 1.7 x10 3/mm — AB (ref 0.2–1.0)
Monocyte %: 10.1 %
NEUTROS ABS: 12.6 10*3/uL — AB (ref 1.4–6.5)
Neutrophil %: 76.5 %
PLATELETS: 426 10*3/uL (ref 150–440)
RBC: 4.15 10*6/uL — AB (ref 4.40–5.90)
RDW: 12.9 % (ref 11.5–14.5)
WBC: 16.5 10*3/uL — ABNORMAL HIGH (ref 3.8–10.6)

## 2014-03-09 ENCOUNTER — Inpatient Hospital Stay: Payer: Self-pay | Admitting: Internal Medicine

## 2014-03-09 DIAGNOSIS — I10 Essential (primary) hypertension: Secondary | ICD-10-CM | POA: Diagnosis not present

## 2014-03-09 DIAGNOSIS — I96 Gangrene, not elsewhere classified: Secondary | ICD-10-CM | POA: Diagnosis present

## 2014-03-09 DIAGNOSIS — Z79891 Long term (current) use of opiate analgesic: Secondary | ICD-10-CM | POA: Diagnosis not present

## 2014-03-09 DIAGNOSIS — Z22322 Carrier or suspected carrier of Methicillin resistant Staphylococcus aureus: Secondary | ICD-10-CM | POA: Diagnosis not present

## 2014-03-09 DIAGNOSIS — L0231 Cutaneous abscess of buttock: Secondary | ICD-10-CM | POA: Diagnosis not present

## 2014-03-09 DIAGNOSIS — F319 Bipolar disorder, unspecified: Secondary | ICD-10-CM | POA: Diagnosis present

## 2014-03-09 DIAGNOSIS — K759 Inflammatory liver disease, unspecified: Secondary | ICD-10-CM | POA: Diagnosis present

## 2014-03-09 DIAGNOSIS — R739 Hyperglycemia, unspecified: Secondary | ICD-10-CM | POA: Diagnosis not present

## 2014-03-09 DIAGNOSIS — L0291 Cutaneous abscess, unspecified: Secondary | ICD-10-CM | POA: Diagnosis not present

## 2014-03-09 DIAGNOSIS — Z915 Personal history of self-harm: Secondary | ICD-10-CM | POA: Diagnosis not present

## 2014-03-09 DIAGNOSIS — F250 Schizoaffective disorder, bipolar type: Secondary | ICD-10-CM | POA: Diagnosis not present

## 2014-03-09 DIAGNOSIS — L02213 Cutaneous abscess of chest wall: Secondary | ICD-10-CM | POA: Diagnosis present

## 2014-03-09 DIAGNOSIS — F209 Schizophrenia, unspecified: Secondary | ICD-10-CM | POA: Diagnosis present

## 2014-03-09 DIAGNOSIS — L0390 Cellulitis, unspecified: Secondary | ICD-10-CM | POA: Diagnosis not present

## 2014-03-09 DIAGNOSIS — Z89201 Acquired absence of right upper limb, unspecified level: Secondary | ICD-10-CM | POA: Diagnosis not present

## 2014-03-10 DIAGNOSIS — R739 Hyperglycemia, unspecified: Secondary | ICD-10-CM | POA: Diagnosis not present

## 2014-03-10 DIAGNOSIS — L0390 Cellulitis, unspecified: Secondary | ICD-10-CM | POA: Diagnosis not present

## 2014-03-10 DIAGNOSIS — Z22322 Carrier or suspected carrier of Methicillin resistant Staphylococcus aureus: Secondary | ICD-10-CM | POA: Diagnosis not present

## 2014-03-10 DIAGNOSIS — I10 Essential (primary) hypertension: Secondary | ICD-10-CM | POA: Diagnosis not present

## 2014-03-10 DIAGNOSIS — F250 Schizoaffective disorder, bipolar type: Secondary | ICD-10-CM | POA: Diagnosis not present

## 2014-03-10 LAB — CREATININE, SERUM
CREATININE: 0.79 mg/dL (ref 0.60–1.30)
EGFR (Non-African Amer.): >60

## 2014-03-10 LAB — WBC: WBC: 14.5 10*3/uL — AB (ref 3.8–10.6)

## 2014-03-10 LAB — VANCOMYCIN, TROUGH: VANCOMYCIN, TROUGH: 13 ug/mL (ref 10–20)

## 2014-03-11 DIAGNOSIS — I10 Essential (primary) hypertension: Secondary | ICD-10-CM | POA: Diagnosis not present

## 2014-03-11 DIAGNOSIS — F250 Schizoaffective disorder, bipolar type: Secondary | ICD-10-CM | POA: Diagnosis not present

## 2014-03-11 DIAGNOSIS — L0390 Cellulitis, unspecified: Secondary | ICD-10-CM | POA: Diagnosis not present

## 2014-03-11 DIAGNOSIS — R739 Hyperglycemia, unspecified: Secondary | ICD-10-CM | POA: Diagnosis not present

## 2014-03-11 DIAGNOSIS — Z22322 Carrier or suspected carrier of Methicillin resistant Staphylococcus aureus: Secondary | ICD-10-CM | POA: Diagnosis not present

## 2014-03-13 LAB — WOUND CULTURE

## 2014-03-13 LAB — CULTURE, BLOOD (SINGLE)

## 2014-03-14 DIAGNOSIS — L0231 Cutaneous abscess of buttock: Secondary | ICD-10-CM | POA: Diagnosis not present

## 2014-03-14 DIAGNOSIS — B9562 Methicillin resistant Staphylococcus aureus infection as the cause of diseases classified elsewhere: Secondary | ICD-10-CM | POA: Diagnosis not present

## 2014-03-14 DIAGNOSIS — F209 Schizophrenia, unspecified: Secondary | ICD-10-CM | POA: Diagnosis not present

## 2014-03-14 DIAGNOSIS — L03313 Cellulitis of chest wall: Secondary | ICD-10-CM | POA: Diagnosis not present

## 2014-03-14 DIAGNOSIS — L02213 Cutaneous abscess of chest wall: Secondary | ICD-10-CM | POA: Diagnosis not present

## 2014-03-14 DIAGNOSIS — I10 Essential (primary) hypertension: Secondary | ICD-10-CM | POA: Diagnosis not present

## 2014-03-14 DIAGNOSIS — F319 Bipolar disorder, unspecified: Secondary | ICD-10-CM | POA: Diagnosis not present

## 2014-03-14 LAB — CULTURE, BLOOD (SINGLE)

## 2014-03-15 DIAGNOSIS — L02213 Cutaneous abscess of chest wall: Secondary | ICD-10-CM | POA: Diagnosis not present

## 2014-03-15 DIAGNOSIS — L0231 Cutaneous abscess of buttock: Secondary | ICD-10-CM | POA: Diagnosis not present

## 2014-03-15 DIAGNOSIS — F209 Schizophrenia, unspecified: Secondary | ICD-10-CM | POA: Diagnosis not present

## 2014-03-15 DIAGNOSIS — L03313 Cellulitis of chest wall: Secondary | ICD-10-CM | POA: Diagnosis not present

## 2014-03-15 DIAGNOSIS — F319 Bipolar disorder, unspecified: Secondary | ICD-10-CM | POA: Diagnosis not present

## 2014-03-15 DIAGNOSIS — B9562 Methicillin resistant Staphylococcus aureus infection as the cause of diseases classified elsewhere: Secondary | ICD-10-CM | POA: Diagnosis not present

## 2014-03-16 DIAGNOSIS — F319 Bipolar disorder, unspecified: Secondary | ICD-10-CM | POA: Diagnosis not present

## 2014-03-16 DIAGNOSIS — L02213 Cutaneous abscess of chest wall: Secondary | ICD-10-CM | POA: Diagnosis not present

## 2014-03-16 DIAGNOSIS — F209 Schizophrenia, unspecified: Secondary | ICD-10-CM | POA: Diagnosis not present

## 2014-03-16 DIAGNOSIS — L0231 Cutaneous abscess of buttock: Secondary | ICD-10-CM | POA: Diagnosis not present

## 2014-03-16 DIAGNOSIS — B9562 Methicillin resistant Staphylococcus aureus infection as the cause of diseases classified elsewhere: Secondary | ICD-10-CM | POA: Diagnosis not present

## 2014-03-16 DIAGNOSIS — L03313 Cellulitis of chest wall: Secondary | ICD-10-CM | POA: Diagnosis not present

## 2014-03-17 DIAGNOSIS — L03313 Cellulitis of chest wall: Secondary | ICD-10-CM | POA: Diagnosis not present

## 2014-03-17 DIAGNOSIS — L0231 Cutaneous abscess of buttock: Secondary | ICD-10-CM | POA: Diagnosis not present

## 2014-03-17 DIAGNOSIS — F209 Schizophrenia, unspecified: Secondary | ICD-10-CM | POA: Diagnosis not present

## 2014-03-17 DIAGNOSIS — F319 Bipolar disorder, unspecified: Secondary | ICD-10-CM | POA: Diagnosis not present

## 2014-03-17 DIAGNOSIS — L02213 Cutaneous abscess of chest wall: Secondary | ICD-10-CM | POA: Diagnosis not present

## 2014-03-17 DIAGNOSIS — B9562 Methicillin resistant Staphylococcus aureus infection as the cause of diseases classified elsewhere: Secondary | ICD-10-CM | POA: Diagnosis not present

## 2014-03-18 DIAGNOSIS — L0390 Cellulitis, unspecified: Secondary | ICD-10-CM | POA: Diagnosis not present

## 2014-03-23 DIAGNOSIS — F209 Schizophrenia, unspecified: Secondary | ICD-10-CM | POA: Diagnosis not present

## 2014-03-23 DIAGNOSIS — L0231 Cutaneous abscess of buttock: Secondary | ICD-10-CM | POA: Diagnosis not present

## 2014-03-23 DIAGNOSIS — L03313 Cellulitis of chest wall: Secondary | ICD-10-CM | POA: Diagnosis not present

## 2014-03-23 DIAGNOSIS — L02213 Cutaneous abscess of chest wall: Secondary | ICD-10-CM | POA: Diagnosis not present

## 2014-03-23 DIAGNOSIS — B9562 Methicillin resistant Staphylococcus aureus infection as the cause of diseases classified elsewhere: Secondary | ICD-10-CM | POA: Diagnosis not present

## 2014-03-23 DIAGNOSIS — F319 Bipolar disorder, unspecified: Secondary | ICD-10-CM | POA: Diagnosis not present

## 2014-03-25 DIAGNOSIS — B9562 Methicillin resistant Staphylococcus aureus infection as the cause of diseases classified elsewhere: Secondary | ICD-10-CM | POA: Diagnosis not present

## 2014-03-25 DIAGNOSIS — F319 Bipolar disorder, unspecified: Secondary | ICD-10-CM | POA: Diagnosis not present

## 2014-03-25 DIAGNOSIS — L02213 Cutaneous abscess of chest wall: Secondary | ICD-10-CM | POA: Diagnosis not present

## 2014-03-25 DIAGNOSIS — L03313 Cellulitis of chest wall: Secondary | ICD-10-CM | POA: Diagnosis not present

## 2014-03-25 DIAGNOSIS — F209 Schizophrenia, unspecified: Secondary | ICD-10-CM | POA: Diagnosis not present

## 2014-03-25 DIAGNOSIS — L0231 Cutaneous abscess of buttock: Secondary | ICD-10-CM | POA: Diagnosis not present

## 2014-03-30 DIAGNOSIS — L03313 Cellulitis of chest wall: Secondary | ICD-10-CM | POA: Diagnosis not present

## 2014-03-30 DIAGNOSIS — L0231 Cutaneous abscess of buttock: Secondary | ICD-10-CM | POA: Diagnosis not present

## 2014-03-30 DIAGNOSIS — L02213 Cutaneous abscess of chest wall: Secondary | ICD-10-CM | POA: Diagnosis not present

## 2014-03-30 DIAGNOSIS — B9562 Methicillin resistant Staphylococcus aureus infection as the cause of diseases classified elsewhere: Secondary | ICD-10-CM | POA: Diagnosis not present

## 2014-03-30 DIAGNOSIS — F319 Bipolar disorder, unspecified: Secondary | ICD-10-CM | POA: Diagnosis not present

## 2014-03-30 DIAGNOSIS — F209 Schizophrenia, unspecified: Secondary | ICD-10-CM | POA: Diagnosis not present

## 2014-04-01 DIAGNOSIS — F319 Bipolar disorder, unspecified: Secondary | ICD-10-CM | POA: Diagnosis not present

## 2014-04-01 DIAGNOSIS — B9562 Methicillin resistant Staphylococcus aureus infection as the cause of diseases classified elsewhere: Secondary | ICD-10-CM | POA: Diagnosis not present

## 2014-04-01 DIAGNOSIS — L02213 Cutaneous abscess of chest wall: Secondary | ICD-10-CM | POA: Diagnosis not present

## 2014-04-01 DIAGNOSIS — L03313 Cellulitis of chest wall: Secondary | ICD-10-CM | POA: Diagnosis not present

## 2014-04-01 DIAGNOSIS — F209 Schizophrenia, unspecified: Secondary | ICD-10-CM | POA: Diagnosis not present

## 2014-04-01 DIAGNOSIS — L0231 Cutaneous abscess of buttock: Secondary | ICD-10-CM | POA: Diagnosis not present

## 2014-04-23 ENCOUNTER — Ambulatory Visit: Payer: Self-pay | Admitting: Internal Medicine

## 2014-04-23 DIAGNOSIS — F17210 Nicotine dependence, cigarettes, uncomplicated: Secondary | ICD-10-CM | POA: Diagnosis not present

## 2014-04-23 DIAGNOSIS — X76XXXA Intentional self-harm by smoke, fire and flames, initial encounter: Secondary | ICD-10-CM | POA: Diagnosis not present

## 2014-04-23 DIAGNOSIS — L03114 Cellulitis of left upper limb: Secondary | ICD-10-CM | POA: Diagnosis not present

## 2014-04-23 DIAGNOSIS — T310 Burns involving less than 10% of body surface: Secondary | ICD-10-CM | POA: Diagnosis not present

## 2014-04-23 DIAGNOSIS — G8929 Other chronic pain: Secondary | ICD-10-CM | POA: Diagnosis not present

## 2014-04-23 DIAGNOSIS — I10 Essential (primary) hypertension: Secondary | ICD-10-CM | POA: Diagnosis not present

## 2014-04-23 DIAGNOSIS — W458XXA Other foreign body or object entering through skin, initial encounter: Secondary | ICD-10-CM | POA: Diagnosis not present

## 2014-04-23 DIAGNOSIS — T300 Burn of unspecified body region, unspecified degree: Secondary | ICD-10-CM | POA: Diagnosis not present

## 2014-04-23 DIAGNOSIS — I498 Other specified cardiac arrhythmias: Secondary | ICD-10-CM | POA: Diagnosis not present

## 2014-04-23 DIAGNOSIS — T22212A Burn of second degree of left forearm, initial encounter: Secondary | ICD-10-CM | POA: Diagnosis not present

## 2014-04-23 DIAGNOSIS — F209 Schizophrenia, unspecified: Secondary | ICD-10-CM | POA: Diagnosis not present

## 2014-04-23 DIAGNOSIS — S51802A Unspecified open wound of left forearm, initial encounter: Secondary | ICD-10-CM | POA: Diagnosis not present

## 2014-04-24 DIAGNOSIS — T300 Burn of unspecified body region, unspecified degree: Secondary | ICD-10-CM | POA: Diagnosis not present

## 2014-04-24 DIAGNOSIS — R441 Visual hallucinations: Secondary | ICD-10-CM | POA: Insufficient documentation

## 2014-04-25 DIAGNOSIS — Z89201 Acquired absence of right upper limb, unspecified level: Secondary | ICD-10-CM | POA: Diagnosis not present

## 2014-04-25 DIAGNOSIS — I2510 Atherosclerotic heart disease of native coronary artery without angina pectoris: Secondary | ICD-10-CM | POA: Diagnosis present

## 2014-04-25 DIAGNOSIS — Z96653 Presence of artificial knee joint, bilateral: Secondary | ICD-10-CM | POA: Diagnosis present

## 2014-04-25 DIAGNOSIS — R443 Hallucinations, unspecified: Secondary | ICD-10-CM | POA: Diagnosis not present

## 2014-04-25 DIAGNOSIS — T22212D Burn of second degree of left forearm, subsequent encounter: Secondary | ICD-10-CM | POA: Diagnosis not present

## 2014-04-25 DIAGNOSIS — I10 Essential (primary) hypertension: Secondary | ICD-10-CM | POA: Diagnosis present

## 2014-04-25 DIAGNOSIS — Z87891 Personal history of nicotine dependence: Secondary | ICD-10-CM | POA: Diagnosis not present

## 2014-04-25 DIAGNOSIS — T310 Burns involving less than 10% of body surface: Secondary | ICD-10-CM | POA: Diagnosis not present

## 2014-04-25 DIAGNOSIS — R109 Unspecified abdominal pain: Secondary | ICD-10-CM | POA: Diagnosis not present

## 2014-04-25 DIAGNOSIS — F209 Schizophrenia, unspecified: Secondary | ICD-10-CM | POA: Insufficient documentation

## 2014-04-25 DIAGNOSIS — T22212A Burn of second degree of left forearm, initial encounter: Secondary | ICD-10-CM | POA: Diagnosis not present

## 2014-04-25 DIAGNOSIS — Z87820 Personal history of traumatic brain injury: Secondary | ICD-10-CM | POA: Insufficient documentation

## 2014-04-25 DIAGNOSIS — Z970 Presence of artificial eye: Secondary | ICD-10-CM | POA: Insufficient documentation

## 2014-04-25 DIAGNOSIS — M1990 Unspecified osteoarthritis, unspecified site: Secondary | ICD-10-CM | POA: Diagnosis present

## 2014-04-25 DIAGNOSIS — I499 Cardiac arrhythmia, unspecified: Secondary | ICD-10-CM | POA: Diagnosis not present

## 2014-04-25 DIAGNOSIS — K5900 Constipation, unspecified: Secondary | ICD-10-CM | POA: Diagnosis present

## 2014-04-25 DIAGNOSIS — S069X9A Unspecified intracranial injury with loss of consciousness of unspecified duration, initial encounter: Secondary | ICD-10-CM | POA: Insufficient documentation

## 2014-04-25 DIAGNOSIS — Z9889 Other specified postprocedural states: Secondary | ICD-10-CM | POA: Diagnosis not present

## 2014-04-25 DIAGNOSIS — R1110 Vomiting, unspecified: Secondary | ICD-10-CM | POA: Diagnosis not present

## 2014-04-25 DIAGNOSIS — G8929 Other chronic pain: Secondary | ICD-10-CM | POA: Diagnosis present

## 2014-09-02 DIAGNOSIS — I259 Chronic ischemic heart disease, unspecified: Secondary | ICD-10-CM | POA: Diagnosis not present

## 2014-09-02 DIAGNOSIS — F259 Schizoaffective disorder, unspecified: Secondary | ICD-10-CM | POA: Diagnosis not present

## 2014-09-02 DIAGNOSIS — I129 Hypertensive chronic kidney disease with stage 1 through stage 4 chronic kidney disease, or unspecified chronic kidney disease: Secondary | ICD-10-CM | POA: Diagnosis not present

## 2014-09-09 DIAGNOSIS — F250 Schizoaffective disorder, bipolar type: Secondary | ICD-10-CM | POA: Diagnosis not present

## 2014-09-10 NOTE — Discharge Summary (Signed)
PATIENT NAMEDWAN, [name]:  [id number] DATE OF BIRTH:  09-11-1950  DATE OF ADMISSION:  03/09/2014 DATE OF DISCHARGE:  03/11/2014  PRESENTING COMPLAINT: Draining abscess over the right gluteal region, painful local swelling over the left lateral chest.   DISCHARGE DIAGNOSES: Methicillin-resistant Staphylococcus aureus, gluteal abscess along with left lateral chest wall abscess, status post incision and drainage.    CODE STATUS: Full code.   MEDICATIONS:  1.  Atenolol 25 mg daily.  2.  Meloxicam 15 mg daily.  3.  Aspirin 81 mg daily.  4.  Bactrim double strength 1 tablet b.i.d. for 10 days.   DISCHARGE INSTRUCTIONS:  1.  Follow up with Dr. Rexene Edison in 1 week.  2.  Follow up with Kathrine Haddock with Dr. Rance Muir office.   LABORATORY DATA: Wound culture: Heavy growth of MRSA. Creatinine 0.79. Blood cultures negative in 48 hours. White count is 14.51 at discharge.   SURGICAL CONSULTATION: Christopher A. Lundquist, MD   VITALS AT DISCHARGE: Temperature 97.5, pulse 65, blood pressure 131/85, saturations  99% on room air.    BRIEF SUMMARY OF HOSPITAL COURSE: Mr. Weatherford is a pleasant 64 year old Caucasian gentleman with history of bipolar disorder, schizophrenia, hypertension and hepatitis comes to the Emergency Room with:  1.  Multiple abscesses over the right gluteal region and left lateral chest wall abscess going on for the past 1-1/2 weeks with discharge of serosanguineous fluid. Blood cultures were negative. The patient was started on IV vancomycin. Underwent debridement by Dr. Rexene Edison in the [facility] on 03/09/2014. Wound cultures grew Staph aureus. The patient remained afebrile. White count trending down. Changed to p.o. Bactrim which he will finish up a day 10 to 12 day course. The patient's home health has been arranged for dressing changes. The patient will follow up with Dr. Rexene Edison in a week.  2.  History of schizophrenia,stable clinically. Does not see any psychiatrist and is  not on any psych medications.  3.  Hypertension, on atenolol.  4.  History of hepatitis, clinically stable.   Hospital stay otherwise remained stable. The patient remained a full code.   TIME SPENT: 40 minutes.    ____________________________ Hart Rochester Posey Pronto, MD sap:AT D: 03/11/2014 14:04:08 ET T: 03/12/2014 01:37:03 ET JOB#: [id number]  cc: Daissy Yerian A. Posey Pronto, MD, <Dictator> Hervey Ard. Julian Hy, NP Guadalupe Maple, MD Glena Norfolk Rexene Edison, MD Ilda Basset MD ELECTRONICALLY SIGNED 03/31/2014 7:45

## 2014-09-10 NOTE — H&P (Signed)
PATIENT NAME:  Ryan [name], BOCHENEK MR#:  [id number] DATE OF BIRTH:  1950-07-15  DATE OF ADMISSION:  03/09/2014  REFERRING DOCTOR:  Elta Guadeloupe R. Jacqualine Code, MD    PRIMARY CARE DOCTOR:  Guadalupe Maple, MD    ADMITTING DOCTOR:  Juluis Mire, MD    CHIEF COMPLAINT:  1. Draining abscess over right gluteal region for the past 1-1/2 weeks.  2.  Painful swelling with local redness over left lateral chest wall for the past few days.    HISTORY OF PRESENT ILLNESS: A 64 year old Caucasian male with past medical history significant for bipolar disorder/schizophrenia, history of hypertension presents to the Emergency Room with the complaints of discharging/draining abscess right gluteal region ongoing for the past 1-1/2 weeks and also has been having painful swelling/abscess with redness over left lateral chest wall for the past few days. The patient stated he noticed these symptoms a few weeks ago but did not seek any medical attention and he has an upcoming appointment with his primary care doctor in the next 1-1/2 weeks. Meanwhile the discharge from the right gluteal region increased with some bloody discharge and increased pain. Hence he decided to come to the Emergency Room for further evaluation of right gluteal abscess as well as left chest wall abscess and cellulitis.  Denies any fever. No shortness of breath. No dizziness. No nausea. No vomiting. No diarrhea. No urinary symptoms. [name]   PAST MEDICAL HISTORY:  1.  Bipolar disorder/schizophrenia.  2.  Hypertension.  3.  History of hepatitis, details not known.  4.  Status post right arm amputation (self mutilation).  5.  Status post right eye self mutilation with prosthesis.   PAST SURGICAL HISTORY:  1.  History of stomach surgery, details not known. The patient not able to give details of the surgery.  2.  Status post right arm amputation following self destruction/mutilation.  3.  Right eye prosthesis.  4.  Bilateral knee replacements.  5.  Status post  elbow surgery.  6.  Bilateral shoulder surgery.    ALLERGIES: No known drug allergies.   HOME MEDICATIONS:  1.  Acetaminophen/oxycodone 325/7.5 mg 1 tablet orally 2 times a day.  2.  Atenolol 25 mg 1 tablet once a day.  3.  Benztropine 2 mg tablet 1 tablet once a day at bedtime.  4.  Cyclobenzaprine 10 mg tablet 3 tablets once a day.  5.  Gabapentin 300 mg tablet 1 tablet 2 times a day.  6.  Oxycodone 5 mg tablet 1 to 2 tablets as needed for pain.  7.  Percocet 5/325 mg tablet 1 tablet every 6 hours as needed.  8.  Trifluoperazine 2 mg oral tablet orally once at bedtime.  9.  Vitamin E 400 international units capsule 1 tablet 2 times a day.   SOCIAL HISTORY: Lives at home by himself but he said that he does have company. 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 Quit about 3 years ago and states he has been clean off  IV drugs, substance abuse, for the past 3 years. Denies any alcohol usage.   FAMILY HISTORY: Significant for cancers. Further details not able to elaborate.   REVIEW OF SYSTEMS:   CONSTITUTIONAL:  Negative for fever, fatigue, general weakness. No abnormal weight gain or weight loss.  EYES:  Right eye prosthesis. Denies any blurred or double vision in the left eye. No pain, no redness, no inflammation.  EARS, NOSE, THROAT:  Negative for tinnitus, ear pain, hearing loss,  epistaxis, nasal discharge, or difficulty swallowing.  RESPIRATORY:  Negative for cough, wheezing, hemoptysis, painful respirations, dyspnea.  CARDIOVASCULAR:  Does have some left-sided chest wall pain secondary to some local abscess and cellulitis.  Otherwise, denies any dyspnea on exertion, no pedal edema, no palpitations, no syncope or dizziness.  GASTROINTESTINAL:  Negative for nausea, vomiting, diarrhea, abdominal pain, hematemesis, melena or hematochezia.   GENITOURINARY:  Negative for dysuria, hematuria, frequency, urgency.  ENDOCRINE:  Negative for polyuria, polydipsia.  No heat or cold  intolerance.  HEMATOLOGIC/LYMPHATICS:  Negative for anemia, easy bruising.  SKIN:  Significant abscesses right gluteal region ongoing for the past 1-1/2 weeks with bloody discharge and serosanguineous fluid.  He also has a significant painful abscess over left lateral chest wall with surrounding erythema and cellulitis ongoing for the past few days.   NEUROLOGICAL:  He does have chronic numbness of the bilateral feet but otherwise denies any focal weakness or focal numbness.  Negative for any history of CVA, TIA or seizure activity.  PSYCHIATRIC:  History of bipolar disorder/schizophrenia, under the care of psychiatrist and taking medications and stays under control.    PHYSICAL EXAMINATION:  VITAL SIGNS: Temperature 98 degrees Fahrenheit, pulse rate 71 beats per minute, respirations 20 per minute, blood pressure 148/71, O2  saturation is 97% on room air.  GENERAL: Elderly male, alert, awake and oriented, comfortably lying in the bed, not in any distress, cooperative.  HEENT: Head:  Atraumatic, normocephalic.  Eyes: Right eye prosthesis. Left eye no conjunctival pallor, no scleral icterus. Extraocular movements of the left eye normal.  Nose:  No nasal lesions.  No drainage.  Ears:  No drainage. No external lesions.  Mouth:  No mucosal lesions.  No exudates.  The patient is edentulous.   NECK:  Supple. No JVD. No thyromegaly. No carotid bruit.  Range of motion normal.   RESPIRATORY:  Good respiratory effort.  Not using any accessory muscles of respiration.  Bilateral vesicular breath sounds present.   CARDIOVASCULAR:  S1, S2 regular. No murmurs or clicks or gallops appreciated.  Pulses equal at carotid, femoral, pedal pulses. No peripheral edema.  GASTROINTESTINAL:  Abdomen soft, nontender.  No hepatosplenomegaly.  Bowel sounds present and equal in all 4 quadrants present.  No rebound, no guarding, no rigidity.  GENITOURINARY:  Deferred.  MUSCULOSKELETAL:  Amputation of right upper extremity present  and amputation of left index terminal phalanx present.  Range of motion adequate in the remaining, in all the limbs.  SKIN:   1.  Large draining abscess over the right gluteal region with base covered with purulent slough and discharging of serosanguineous fluid present.  Surrounding erythema present.  Local tenderness present.   2.  Large area of erythema over left lateral chest wall with fluctuating mass, which was drained by the ED physician in the Emergency Room with drainage of serosanguineous fluid with abscess over the left lateral chest wall present.   LYMPHATICS:  No cervical lymphadenopathy.  VASCULAR:  Good dorsalis pedis and posterior tibial pulses.  NEUROLOGICAL:  Alert, awake and oriented x 3.  Cranial nerves II-XII grossly intact.  The motor strength normal.  No focal deficit.   PSYCHIATRIC:  Judgment, insight adequate. Alert and oriented x 3. Memory and mood within normal limits.   LABORATORY DATA: Serum glucose 114, BUN 16, creatinine 0.68, sodium 138, potassium 3.5, chloride 102, bicarbonate 28, calcium 8.7, total protein 7.5, albumin 2.8, total bilirubin 0.5, alkaline phosphatase 47, AST 24, ALT 20, WBC elevated at 16.5, hemoglobin 11.7,  hematocrit 36.2, platelets 426, MCV 87, neutrophil count 12.6.   ASSESSMENT AND PLAN: A 64 year old Caucasian male with a past medical history significant for bipolar disorder/schizophrenia, history of hypertension, hepatitis presents to the Emergency Room with the complaints of right gluteal abscess with discharging serosanguineous fluid ongoing for the past 1-1/2 weeks and left lateral chest wall redness with abscess ongoing for the past few days.  1.  Multiple abscesses involving over the right gluteal region ongoing for the past 1-1/2 weeks with discharge of serosanguineous fluid. Base of the ulcer covered with yellow slough. Surrounding erythema and edema present. Left lateral chest wall abscess about 3 inches in diameter with surrounding  significant erythema. It was drained by the ED physician with the drainage of bloody fluid.  Plan: Admit to med/surg unit. Blood cultures, wound cultures, IV antibiotic with  vancomycin. Surgical consult placed. Local wound care.  2.  History of schizophrenia, on multiple psychiatric medications. Stable clinically. Continue current psychiatric: medications.  3.  History of hypertension, controlled on atenolol. Continue same.  4.  History of hepatitis. Details not known. The patient stable clinically. LFTs normal. Monitor.  5.  Elevated blood sugars. No history of diabetes mellitus. Check A1c to rule out diabetes mellitus.   CODE STATUS: Full code.   TIME SPENT: 55 minutes.    ____________________________ Juluis Mire, MD enr:AT D: 03/09/2014 01:50:32 ET T: 03/09/2014 04:11:06 ET JOB#: [id number]  cc: Juluis Mire, MD, <Dictator> Guadalupe Maple, MD Juluis Mire MD ELECTRONICALLY SIGNED 03/13/2014 4:52

## 2014-09-10 NOTE — Op Note (Signed)
PATIENT NAME:  Ryan [name], Ryan [name] MR#:  [id number] DATE OF BIRTH:  Oct 04, 1950  DATE OF PROCEDURE:  03/09/2014  ATTENDING PHYSICIAN: Harrell Gave A. [name], MD  PREOPERATIVE DIAGNOSES: Right buttock abscess with necrosis and left chest abscess.   POSTOPERATIVE DIAGNOSES: Right buttock abscess with necrosis and left chest abscess.   PROCEDURES PERFORMED: 1. Incision, drainage and debridement of right buttock abscess with debridement of 2 x 2 x 1 cm soft tissue down to subcutaneous fat.  2. Left chest wall abscess I and D.   ANESTHESIA: General.   ESTIMATED BLOOD LOSS: 25 mL.   SPECIMEN: Culture of left wound abscess.  INDICATION FOR SURGERY: [name] is a pleasant, 64 year old male who has had approximately 1 week of worsening buttock and left chest pain, redness, erythema and bleeding. He had noticeable abscesses, the one on the buttocks with necrotic tissue. He was brought to the Operating Room for debridement.   DETAILS OF PROCEDURE: Informed consent was obtained. [name] was brought to the Operating Room suite. He was induced. LMA was placed. General anesthesia was administered. He was propped up in the left lateral decubitus position. His buttocks was then prepped and draped in standard surgical fashion. A timeout was then performed correctly identifying the patient name, operative site and procedure to be performed. The area with necrosis was initially debrided and it extended into a large cavity. A counterincision was made at the end of the large cavity, and a Penrose drain was placed through the entirety of the cavity and it was sutured at both ends with a 3-0 nylon suture. Betadine-soaked gauze was packed into the cavity and onto the ulcer and a sterile dressing was placed over the wound.  The patient was then laid back in the supine position and his left chest was prepped and draped. A timeout was performed correctly identifying the patient name, operative site and procedure to be  performed. An incision was made over the area of fluctuance. There was a large amount of purulence and bloody purulence. The cavity was probed and loculations were broken up. There was a large cavity which extended cephalad and caudate and posterior. Two counter incisions were made and two Penrose were placed to the first incision and the second and the first incision and the third. These were sutured in place with nylon suture. Betadine-soaked gauze was packed into the cavity and a sterile dressing was then placed over the wound. The patient was then awoken, extubated and brought to the postanesthesia care unit. There were no immediate complications. Needle, sponge, and instrument counts were correct at the end of the procedure.    ____________________________ Glena Norfolk. Jamieon Lannen, MD cal:jh D: 03/11/2014 11:59:40 ET T: 03/11/2014 12:18:59 ET JOB#: [id number]  cc: Harrell Gave A. Dashton Czerwinski, MD, <Dictator> Floyde Parkins MD ELECTRONICALLY SIGNED 03/22/2014 7:03

## 2014-09-10 NOTE — Consult Note (Signed)
PATIENT NAME:  TRAVELL, DESAULNIERS MR#:  [id number] DATE OF BIRTH:  July 24, 1950  DATE OF CONSULTATION:  03/09/2014  REFERRING PHYSICIAN:    CONSULTING PHYSICIAN:  Harrell Gave A. Zayda Angell, MD  DATE OF PROCEDURE: 03/09/2014.   REASON FOR CONSULTATION: Right gluteal abscess with necrosis. Left lateral chest wall abscess.   HISTORY OF PRESENT ILLNESS: Mr. Police is a pleasant 64 year old with a history significant for bipolar schizophrenia, hypertension, who presented to the ED this morning with a right gluteal painful abscess over his right buttocks for the past 1 to 1-1/2 weeks and a 3 day history of left swelling abscess over his left lateral chest wall. He had noticed that it had become bloody and became more painful. Therefore, he presented to the Emergency Room and denies any fever. No chills, night sweats, shortness of breath, cough, abdominal pain, nausea, vomiting, diarrhea, constipation.   PAST MEDICAL HISTORY:   1.  Bipolar disorder.  2.  Schizophrenia. 3.  Hypertension  4.  Hepatitis.  5.  History of right arm amputation, self-mutilation, history of high amputation with mutilation.  6.  History of stomach surgery, details unknown.  7.  History of bilateral knee replacements.  8.  History status post elbow surgery.  9.  History of shoulder surgery.   HOME MEDICATIONS: Percocet, atenolol, benztropine, cyclobenzaprine, gabapentin, oxycodone, trifluoperazine, vitamin D.   ALLERGIES: No known drug allergies.   SOCIAL HISTORY: Denies tobacco or alcohol use currently.   FAMILY HISTORY: History of cancers.   REVIEW OF SYSTEMS:  A 12-point review of systems was obtained. Pertinent positives and negatives as above.   PHYSICAL EXAMINATION: VITAL SIGNS: Temperature 98.2, pulse 59, blood pressure 113/74, respirations 18, 96% on room air.  GENERAL: No acute distress, alert and oriented x3.  HEAD: Normocephalic, atraumatic.  EYES: No scleral icterus. No conjunctivitis.  FACE: No obvious  facial trauma. Normal external nose. Normal external ears.  CHEST:  A small abscess with no packing and essentially inadequately drained over his left chest.  ABDOMEN: Soft, nontender, nondistended.  HEART: Regular rate and rhythm. No murmurs, rubs or gallops.  LUNGS: Clear to auscultation, moving air well.  EXTREMITIES: Has a significant area of erythema as well as large approximately 2 x 2 cm area of necrosis with necrotic tissue and cellulitis.   LABORATORY DATA: As follows: White cell count 16.5 with 76% neutrophils. Otherwise unremarkable.   IMAGING: None.   ASSESSMENT AND PLAN: Mr. Smail is a pleasant, 64 year old, who presents with necrotic abscess over his right buttocks as well as a left inadequately drained chest wall abscess. Will plan on operating for debridement and incision and drainage of wounds. Will be made n.p.o. I have discussed the procedure with him and informed consent has been obtained after explanation of potential benefits and complications.   ____________________________ Glena Norfolk Taijon Vink, MD cal:jp D: 03/09/2014 10:13:30 ET T: 03/09/2014 10:44:58 ET JOB#: [id number]  cc: Harrell Gave A. Inioluwa Boulay, MD, <Dictator> Floyde Parkins MD ELECTRONICALLY SIGNED 03/22/2014 7:02

## 2014-10-04 DIAGNOSIS — G64 Other disorders of peripheral nervous system: Secondary | ICD-10-CM | POA: Diagnosis not present

## 2014-10-04 DIAGNOSIS — M791 Myalgia: Secondary | ICD-10-CM | POA: Diagnosis not present

## 2014-10-04 DIAGNOSIS — G629 Polyneuropathy, unspecified: Secondary | ICD-10-CM | POA: Diagnosis not present

## 2014-10-04 DIAGNOSIS — E669 Obesity, unspecified: Secondary | ICD-10-CM | POA: Diagnosis not present

## 2014-10-04 DIAGNOSIS — R202 Paresthesia of skin: Secondary | ICD-10-CM | POA: Diagnosis not present

## 2014-10-05 DIAGNOSIS — F250 Schizoaffective disorder, bipolar type: Secondary | ICD-10-CM | POA: Diagnosis not present

## 2014-11-16 DIAGNOSIS — F250 Schizoaffective disorder, bipolar type: Secondary | ICD-10-CM | POA: Diagnosis not present

## 2014-12-12 DIAGNOSIS — M79672 Pain in left foot: Secondary | ICD-10-CM | POA: Diagnosis not present

## 2014-12-12 DIAGNOSIS — M79671 Pain in right foot: Secondary | ICD-10-CM | POA: Diagnosis not present

## 2014-12-12 DIAGNOSIS — R200 Anesthesia of skin: Secondary | ICD-10-CM | POA: Diagnosis not present

## 2015-01-25 ENCOUNTER — Encounter: Payer: Self-pay | Admitting: Unknown Physician Specialty

## 2015-01-25 ENCOUNTER — Ambulatory Visit (INDEPENDENT_AMBULATORY_CARE_PROVIDER_SITE_OTHER): Payer: Medicare Other | Admitting: Unknown Physician Specialty

## 2015-01-25 VITALS — BP 111/74 | HR 62 | Temp 97.9°F | Ht 64.6 in | Wt 194.4 lb

## 2015-01-25 DIAGNOSIS — M8580 Other specified disorders of bone density and structure, unspecified site: Secondary | ICD-10-CM

## 2015-01-25 DIAGNOSIS — G5602 Carpal tunnel syndrome, left upper limb: Secondary | ICD-10-CM | POA: Insufficient documentation

## 2015-01-25 DIAGNOSIS — J309 Allergic rhinitis, unspecified: Secondary | ICD-10-CM

## 2015-01-25 DIAGNOSIS — E538 Deficiency of other specified B group vitamins: Secondary | ICD-10-CM

## 2015-01-25 DIAGNOSIS — B182 Chronic viral hepatitis C: Secondary | ICD-10-CM

## 2015-01-25 DIAGNOSIS — E1159 Type 2 diabetes mellitus with other circulatory complications: Secondary | ICD-10-CM | POA: Insufficient documentation

## 2015-01-25 DIAGNOSIS — G8929 Other chronic pain: Secondary | ICD-10-CM

## 2015-01-25 DIAGNOSIS — E785 Hyperlipidemia, unspecified: Secondary | ICD-10-CM

## 2015-01-25 DIAGNOSIS — N183 Chronic kidney disease, stage 3 unspecified: Secondary | ICD-10-CM

## 2015-01-25 DIAGNOSIS — L659 Nonscarring hair loss, unspecified: Secondary | ICD-10-CM

## 2015-01-25 DIAGNOSIS — R7301 Impaired fasting glucose: Secondary | ICD-10-CM

## 2015-01-25 DIAGNOSIS — I129 Hypertensive chronic kidney disease with stage 1 through stage 4 chronic kidney disease, or unspecified chronic kidney disease: Secondary | ICD-10-CM

## 2015-01-25 DIAGNOSIS — F259 Schizoaffective disorder, unspecified: Secondary | ICD-10-CM

## 2015-01-25 DIAGNOSIS — L719 Rosacea, unspecified: Secondary | ICD-10-CM

## 2015-01-25 DIAGNOSIS — G629 Polyneuropathy, unspecified: Secondary | ICD-10-CM

## 2015-01-25 DIAGNOSIS — I10 Essential (primary) hypertension: Secondary | ICD-10-CM | POA: Insufficient documentation

## 2015-01-25 DIAGNOSIS — R7302 Impaired glucose tolerance (oral): Secondary | ICD-10-CM

## 2015-01-25 DIAGNOSIS — E790 Hyperuricemia without signs of inflammatory arthritis and tophaceous disease: Secondary | ICD-10-CM

## 2015-01-25 NOTE — Assessment & Plan Note (Signed)
Orthopedic Referral ordered per patient request to discuss possible surgical interventions

## 2015-01-25 NOTE — Progress Notes (Signed)
   BP 111/74 mmHg  Pulse 62  Temp(Src) 97.9 F (36.6 C)  Ht 5' 4.6" (1.641 m)  Wt 194 lb 6.4 oz (88.179 kg)  BMI 32.75 kg/m2  SpO2 95%   Subjective:    Patient ID: Ryan [name], male    DOB: Jul 24, 1950, 64 y.o.   MRN: [id number]  HPI: Ryan [name] is a 64 y.o. male  Chief Complaint  Patient presents with  . Carpal Tunnel    pt states his fingers are numb and tingly and hurts all the way up to his elbow  Pt presents with c/o of left wrist shooting pain that radiates to elbow with numbness and tingling of fingers. This is a chronic problem onset 5 years ago, symptoms worsened 3 days ago while working on his car.  Aggravating factors activity. Relieving factors hot showers and use of splint. He would like an Orthopedic Referral to discuss surgical interventions.  Relevant past medical, surgical, family and social history reviewed and updated as indicated. Interim medical history since our last visit reviewed. Allergies and medications reviewed and updated.  Review of Systems  Musculoskeletal:       Left wrist pain that radiates to elbow; denies right wrist pain  Neurological: Positive for numbness.    Per HPI unless specifically indicated above     Objective:    BP 111/74 mmHg  Pulse 62  Temp(Src) 97.9 F (36.6 C)  Ht 5' 4.6" (1.641 m)  Wt 194 lb 6.4 oz (88.179 kg)  BMI 32.75 kg/m2  SpO2 95%  Wt Readings from Last 3 Encounters:  01/25/15 194 lb 6.4 oz (88.179 kg)  10/04/14 196 lb (88.905 kg)    Physical Exam  Constitutional: He is oriented to person, place, and time. He appears well-developed and well-nourished. No distress.  HENT:  Head: Normocephalic and atraumatic.  Right Ear: External ear normal.  Left Ear: External ear normal.  Nose: Nose normal.  Neck: Normal range of motion. Neck supple.  Musculoskeletal:       Left wrist: He exhibits decreased range of motion and tenderness.  He has a left forearm and wrist splint in place  Neurological: He is alert  and oriented to person, place, and time.  Skin: He is not diaphoretic.  Psychiatric: He has a normal mood and affect. His behavior is normal. Judgment and thought content normal.    Results for orders placed or performed in visit on 01/25/15  PSA  Result Value Ref Range   PSA from PP   HM COLONOSCOPY  Result Value Ref Range   HM Colonoscopy from PP       Assessment & Plan:   Problem List Items Addressed This Visit    None    Visit Diagnoses    Carpal tunnel syndrome of left wrist    -  Primary    Relevant Medications    gabapentin (NEURONTIN) 100 MG capsule    Other Relevant Orders    Ambulatory referral to Orthopedic Surgery        Follow up plan: Return if symptoms worsen or fail to improve.

## 2015-02-06 DIAGNOSIS — Z23 Encounter for immunization: Secondary | ICD-10-CM | POA: Diagnosis not present

## 2015-02-20 ENCOUNTER — Telehealth: Payer: Self-pay | Admitting: Unknown Physician Specialty

## 2015-02-20 DIAGNOSIS — G6289 Other specified polyneuropathies: Secondary | ICD-10-CM

## 2015-02-20 NOTE — Telephone Encounter (Signed)
Talked to Ryan [name] about this patient's referral to [facility]. Ryan [name] has documentation where she tried to call the patient on September 7th, 8th, and 9th to let him know about his appointment on September 12th at [place]. Patient did not answer and there was no voicemail box set up when Ryan [name] tried to call the patient with his appointment. I just tried to call the patient and let him know this information but there was no answer and the voicemail was not set up. Will try to call again later. Ryan [name]- patient is requesting a referral to the [facility].

## 2015-02-20 NOTE — Telephone Encounter (Signed)
OK for vein clinic.  Next time he calls, someone will have to discuss with him he problems with communicating

## 2015-02-20 NOTE — Telephone Encounter (Signed)
Pt called stated he has not heard anything from the [facility] people about his referral. Pt stated he also needs a referral to the [facility]. Please call pt and follow up. Pt stated he has been waiting for over 2 weeks. Thanks.

## 2015-02-21 NOTE — Telephone Encounter (Signed)
Tried to call patient but there was no answer and no voicemail was set up. Will try again later.

## 2015-02-22 NOTE — Telephone Encounter (Signed)
Tried to call patient but there was no answer and no voicemail set up. Will try again later.

## 2015-02-22 NOTE — Telephone Encounter (Signed)
Patient returned call and I informed him about his missed appointment with [facility]. I provided him there number so he could call them to re-schedule his appointment. I also know that Malachy Mood put in the referral for the vein clinic.

## 2015-02-22 NOTE — Telephone Encounter (Signed)
S/w pt again after you did and told him that he was originally scheduled for [facility] in [place] I apologize I told you [place] location. But He wanted to call them to arange to be scheduled bc of his work scheduled according to him.

## 2015-02-24 ENCOUNTER — Telehealth: Payer: Self-pay

## 2015-02-24 NOTE — Telephone Encounter (Signed)
Patient called and left a voicemail stating that he needs a referral for veins and not podiatry. I tried to call the patient to let him know that we put in a referral for [facility]. Patient did not answer and there was no voicemail set up so I will try to call him later.

## 2015-02-28 NOTE — Telephone Encounter (Signed)
Tried to call patient but there was no answer. Will try to call him again later.

## 2015-03-01 DIAGNOSIS — M1812 Unilateral primary osteoarthritis of first carpometacarpal joint, left hand: Secondary | ICD-10-CM | POA: Diagnosis not present

## 2015-03-01 NOTE — Telephone Encounter (Signed)
Called and spoke to patient. Let him know that we sent the referral to the vein specialist and not podiatry.

## 2015-03-27 DIAGNOSIS — E785 Hyperlipidemia, unspecified: Secondary | ICD-10-CM | POA: Diagnosis not present

## 2015-03-27 DIAGNOSIS — I872 Venous insufficiency (chronic) (peripheral): Secondary | ICD-10-CM | POA: Diagnosis not present

## 2015-03-27 DIAGNOSIS — M7989 Other specified soft tissue disorders: Secondary | ICD-10-CM | POA: Diagnosis not present

## 2015-03-27 DIAGNOSIS — M79609 Pain in unspecified limb: Secondary | ICD-10-CM | POA: Diagnosis not present

## 2015-03-27 DIAGNOSIS — I739 Peripheral vascular disease, unspecified: Secondary | ICD-10-CM | POA: Diagnosis not present

## 2015-04-28 ENCOUNTER — Telehealth: Payer: Self-pay

## 2015-04-28 NOTE — Telephone Encounter (Signed)
Patient called stating he felt really tired, weak, and has no energy. I asked the patient if he could come in this afternoon but he said no so I scheduled him a visit for Monday. I told the patient to please go to urgent care or the ER if he gets worse over the weekend. Patient stated he would.

## 2015-05-01 ENCOUNTER — Ambulatory Visit (INDEPENDENT_AMBULATORY_CARE_PROVIDER_SITE_OTHER): Payer: Medicare Other | Admitting: Unknown Physician Specialty

## 2015-05-01 ENCOUNTER — Encounter: Payer: Self-pay | Admitting: Unknown Physician Specialty

## 2015-05-01 VITALS — BP 126/85 | HR 57 | Temp 98.1°F | Ht 63.7 in | Wt 190.6 lb

## 2015-05-01 DIAGNOSIS — B182 Chronic viral hepatitis C: Secondary | ICD-10-CM

## 2015-05-01 DIAGNOSIS — R5382 Chronic fatigue, unspecified: Secondary | ICD-10-CM | POA: Diagnosis not present

## 2015-05-01 NOTE — Assessment & Plan Note (Signed)
Will check antibody, if continued positive, will refer to GI

## 2015-05-01 NOTE — Progress Notes (Signed)
BP 126/85 mmHg  Pulse 57  Temp(Src) 98.1 F (36.7 C)  Ht 5' 3.7" (1.618 m)  Wt 190 lb 9.6 oz (86.456 kg)  BMI 33.02 kg/m2  SpO2 93%   Subjective:    Patient ID: Ryan [name], male    DOB: [birth year], 64 y.o.   MRN: [id number]  HPI: Ryan [name] is a 64 y.o. male  Chief Complaint  Patient presents with  . Fatigue    pt states he has felt tired and not rested for about 5 days now.   Pt states he is "tired."  States he wakes up not rested and unable to do anything. Not sleepy, just tired.  He did clean his whole house on his own.  He doesn't know if he snores at night.  He does have a hysteroy of Hep C and it is unclear if he has ever been treated for this.  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  He is under active treatment and records show he is showing up for appointments.  No SOB or chest pain.  No trouble with bowel or bladder.    Relevant past medical, surgical, family and social history reviewed and updated as indicated. Interim medical history since our last visit reviewed. Allergies and medications reviewed and updated.  Review of Systems  Per HPI unless specifically indicated above     Objective:    BP 126/85 mmHg  Pulse 57  Temp(Src) 98.1 F (36.7 C)  Ht 5' 3.7" (1.618 m)  Wt 190 lb 9.6 oz (86.456 kg)  BMI 33.02 kg/m2  SpO2 93%  Wt Readings from Last 3 Encounters:  05/01/15 190 lb 9.6 oz (86.456 kg)  01/25/15 194 lb 6.4 oz (88.179 kg)  10/04/14 196 lb (88.905 kg)    Physical Exam  Constitutional: He is oriented to person, place, and time. He appears well-developed and well-nourished. No distress.  HENT:  Head: Normocephalic and atraumatic.  Eyes: Conjunctivae and lids are normal. Right eye exhibits no discharge. Left eye exhibits no discharge. No scleral icterus.  Cardiovascular: Normal rate and normal heart sounds.   Pulmonary/Chest: Effort normal and breath sounds  normal. No respiratory distress.  Abdominal: Normal appearance. There is no splenomegaly or hepatomegaly.  Musculoskeletal: Normal range of motion.  Neurological: He is alert and oriented to person, place, and time.  Skin: Skin is intact. No rash noted. No pallor.  Psychiatric: He has a normal mood and affect. His behavior is normal. Judgment and thought content normal.    Results for orders placed or performed in visit on 01/25/15  PSA  Result Value Ref Range   PSA from PP   HM COLONOSCOPY  Result Value Ref Range   HM Colonoscopy from PP       Assessment & Plan:   Problem List Items Addressed This Visit      Unprioritized   Chronic hepatitis C ([place])    Will check antibody, if continued positive, will refer to GI      Relevant Orders   Hepatitis C antibody    Other Visit Diagnoses    Chronic fatigue    -  Primary    check labs.  Consider sleep apnea    Relevant Orders    Comprehensive metabolic panel    CBC with Differential/Platelet    TSH        Follow up plan: F/u with lab results and possible a referral  to GI

## 2015-05-02 LAB — CBC WITH DIFFERENTIAL/PLATELET
BASOS ABS: 0 10*3/uL (ref 0.0–0.2)
Basos: 1 %
EOS (ABSOLUTE): 0.2 10*3/uL (ref 0.0–0.4)
EOS: 3 %
HEMATOCRIT: 43.1 % (ref 37.5–51.0)
HEMOGLOBIN: 14.9 g/dL (ref 12.6–17.7)
IMMATURE GRANS (ABS): 0 10*3/uL (ref 0.0–0.1)
Immature Granulocytes: 0 %
LYMPHS: 23 %
Lymphocytes Absolute: 1.9 10*3/uL (ref 0.7–3.1)
MCH: 30.2 pg (ref 26.6–33.0)
MCHC: 34.6 g/dL (ref 31.5–35.7)
MCV: 87 fL (ref 79–97)
MONOCYTES: 8 %
Monocytes Absolute: 0.7 10*3/uL (ref 0.1–0.9)
NEUTROS ABS: 5.1 10*3/uL (ref 1.4–7.0)
Neutrophils: 65 %
Platelets: 287 10*3/uL (ref 150–379)
RBC: 4.94 x10E6/uL (ref 4.14–5.80)
RDW: 13.8 % (ref 12.3–15.4)
WBC: 7.9 10*3/uL (ref 3.4–10.8)

## 2015-05-02 LAB — HEPATITIS C ANTIBODY

## 2015-05-02 LAB — COMPREHENSIVE METABOLIC PANEL
ALT: 21 IU/L (ref 0–44)
AST: 18 IU/L (ref 0–40)
Albumin/Globulin Ratio: 2 (ref 1.1–2.5)
Albumin: 4.3 g/dL (ref 3.6–4.8)
Alkaline Phosphatase: 36 IU/L — ABNORMAL LOW (ref 39–117)
BILIRUBIN TOTAL: 0.8 mg/dL (ref 0.0–1.2)
BUN/Creatinine Ratio: 24 — ABNORMAL HIGH (ref 10–22)
BUN: 18 mg/dL (ref 8–27)
CALCIUM: 9.1 mg/dL (ref 8.6–10.2)
CHLORIDE: 104 mmol/L (ref 96–106)
CO2: 20 mmol/L (ref 18–29)
CREATININE: 0.76 mg/dL (ref 0.76–1.27)
GFR calc non Af Amer: 98 mL/min/{1.73_m2} (ref >59)
GFR, EST AFRICAN AMERICAN: 113 mL/min/{1.73_m2} (ref >59)
GLUCOSE: 91 mg/dL (ref 65–99)
Globulin, Total: 2.1 g/dL (ref 1.5–4.5)
Potassium: 4.2 mmol/L (ref 3.5–5.2)
Sodium: 141 mmol/L (ref 134–144)
TOTAL PROTEIN: 6.4 g/dL (ref 6.0–8.5)

## 2015-05-02 LAB — TSH: TSH: 1.19 u[IU]/mL (ref 0.450–4.500)

## 2015-05-23 ENCOUNTER — Other Ambulatory Visit: Payer: Self-pay | Admitting: Unknown Physician Specialty

## 2015-06-20 ENCOUNTER — Encounter: Payer: Self-pay | Admitting: *Deleted

## 2015-06-20 ENCOUNTER — Emergency Department: Payer: Medicare Other

## 2015-06-20 ENCOUNTER — Inpatient Hospital Stay
Admission: EM | Admit: 2015-06-20 | Discharge: 2015-06-23 | DRG: 440 | Payer: Medicare Other | Attending: Internal Medicine | Admitting: Internal Medicine

## 2015-06-20 DIAGNOSIS — Z7982 Long term (current) use of aspirin: Secondary | ICD-10-CM | POA: Diagnosis not present

## 2015-06-20 DIAGNOSIS — K859 Acute pancreatitis without necrosis or infection, unspecified: Secondary | ICD-10-CM | POA: Diagnosis not present

## 2015-06-20 DIAGNOSIS — K8020 Calculus of gallbladder without cholecystitis without obstruction: Secondary | ICD-10-CM | POA: Diagnosis present

## 2015-06-20 DIAGNOSIS — N189 Chronic kidney disease, unspecified: Secondary | ICD-10-CM | POA: Diagnosis present

## 2015-06-20 DIAGNOSIS — R509 Fever, unspecified: Secondary | ICD-10-CM

## 2015-06-20 DIAGNOSIS — F259 Schizoaffective disorder, unspecified: Secondary | ICD-10-CM | POA: Diagnosis present

## 2015-06-20 DIAGNOSIS — B182 Chronic viral hepatitis C: Secondary | ICD-10-CM | POA: Diagnosis not present

## 2015-06-20 DIAGNOSIS — R451 Restlessness and agitation: Secondary | ICD-10-CM | POA: Diagnosis present

## 2015-06-20 DIAGNOSIS — B1920 Unspecified viral hepatitis C without hepatic coma: Secondary | ICD-10-CM | POA: Diagnosis present

## 2015-06-20 DIAGNOSIS — F1010 Alcohol abuse, uncomplicated: Secondary | ICD-10-CM | POA: Diagnosis present

## 2015-06-20 DIAGNOSIS — R1111 Vomiting without nausea: Secondary | ICD-10-CM | POA: Diagnosis not present

## 2015-06-20 DIAGNOSIS — G629 Polyneuropathy, unspecified: Secondary | ICD-10-CM | POA: Diagnosis present

## 2015-06-20 DIAGNOSIS — Z79899 Other long term (current) drug therapy: Secondary | ICD-10-CM

## 2015-06-20 DIAGNOSIS — I129 Hypertensive chronic kidney disease with stage 1 through stage 4 chronic kidney disease, or unspecified chronic kidney disease: Secondary | ICD-10-CM | POA: Diagnosis present

## 2015-06-20 DIAGNOSIS — G8929 Other chronic pain: Secondary | ICD-10-CM | POA: Diagnosis present

## 2015-06-20 DIAGNOSIS — K851 Biliary acute pancreatitis without necrosis or infection: Principal | ICD-10-CM | POA: Diagnosis present

## 2015-06-20 DIAGNOSIS — Z96653 Presence of artificial knee joint, bilateral: Secondary | ICD-10-CM | POA: Diagnosis present

## 2015-06-20 DIAGNOSIS — K8511 Biliary acute pancreatitis with uninfected necrosis: Secondary | ICD-10-CM | POA: Diagnosis not present

## 2015-06-20 DIAGNOSIS — Z9119 Patient's noncompliance with other medical treatment and regimen: Secondary | ICD-10-CM | POA: Diagnosis not present

## 2015-06-20 DIAGNOSIS — Z87891 Personal history of nicotine dependence: Secondary | ICD-10-CM

## 2015-06-20 DIAGNOSIS — F22 Delusional disorders: Secondary | ICD-10-CM | POA: Diagnosis present

## 2015-06-20 DIAGNOSIS — R1084 Generalized abdominal pain: Secondary | ICD-10-CM

## 2015-06-20 DIAGNOSIS — F209 Schizophrenia, unspecified: Secondary | ICD-10-CM | POA: Diagnosis not present

## 2015-06-20 DIAGNOSIS — I10 Essential (primary) hypertension: Secondary | ICD-10-CM | POA: Diagnosis not present

## 2015-06-20 DIAGNOSIS — K2980 Duodenitis without bleeding: Secondary | ICD-10-CM | POA: Diagnosis not present

## 2015-06-20 DIAGNOSIS — I700 Atherosclerosis of aorta: Secondary | ICD-10-CM | POA: Diagnosis present

## 2015-06-20 DIAGNOSIS — Z89201 Acquired absence of right upper limb, unspecified level: Secondary | ICD-10-CM

## 2015-06-20 DIAGNOSIS — F203 Undifferentiated schizophrenia: Secondary | ICD-10-CM | POA: Diagnosis not present

## 2015-06-20 DIAGNOSIS — R112 Nausea with vomiting, unspecified: Secondary | ICD-10-CM | POA: Diagnosis not present

## 2015-06-20 HISTORY — DX: Schizophrenia, unspecified: F20.9

## 2015-06-20 LAB — COMPREHENSIVE METABOLIC PANEL
ALBUMIN: 4.3 g/dL (ref 3.5–5.0)
ALT: 219 U/L — ABNORMAL HIGH (ref 17–63)
ANION GAP: 8 (ref 5–15)
AST: 229 U/L — ABNORMAL HIGH (ref 15–41)
Alkaline Phosphatase: 63 U/L (ref 38–126)
BUN: 21 mg/dL — ABNORMAL HIGH (ref 6–20)
CALCIUM: 9.3 mg/dL (ref 8.9–10.3)
CHLORIDE: 104 mmol/L (ref 101–111)
CO2: 26 mmol/L (ref 22–32)
Creatinine, Ser: 0.92 mg/dL (ref 0.61–1.24)
GFR calc non Af Amer: >60 mL/min (ref >60)
GLUCOSE: 125 mg/dL — AB (ref 65–99)
POTASSIUM: 4 mmol/L (ref 3.5–5.1)
SODIUM: 138 mmol/L (ref 135–145)
Total Bilirubin: 5.2 mg/dL — ABNORMAL HIGH (ref 0.3–1.2)
Total Protein: 7.5 g/dL (ref 6.5–8.1)

## 2015-06-20 LAB — URINALYSIS COMPLETE WITH MICROSCOPIC (ARMC ONLY)
Bilirubin Urine: NEGATIVE
GLUCOSE, UA: NEGATIVE mg/dL
HGB URINE DIPSTICK: NEGATIVE
Ketones, ur: NEGATIVE mg/dL
LEUKOCYTES UA: NEGATIVE
NITRITE: NEGATIVE
PH: 6 (ref 5.0–8.0)
Protein, ur: NEGATIVE mg/dL
SPECIFIC GRAVITY, URINE: 1.027 (ref 1.005–1.030)
Squamous Epithelial / LPF: NONE SEEN

## 2015-06-20 LAB — URINE DRUG SCREEN, QUALITATIVE (ARMC ONLY)
Amphetamines, Ur Screen: NOT DETECTED
BARBITURATES, UR SCREEN: NOT DETECTED
Benzodiazepine, Ur Scrn: NOT DETECTED
CANNABINOID 50 NG, UR ~~LOC~~: NOT DETECTED
COCAINE METABOLITE, UR ~~LOC~~: NOT DETECTED
MDMA (ECSTASY) UR SCREEN: NOT DETECTED
Methadone Scn, Ur: NOT DETECTED
OPIATE, UR SCREEN: NOT DETECTED
PHENCYCLIDINE (PCP) UR S: NOT DETECTED
TRICYCLIC, UR SCREEN: NOT DETECTED

## 2015-06-20 LAB — CBC WITH DIFFERENTIAL/PLATELET
BASOS PCT: 0 %
Basophils Absolute: 0 10*3/uL (ref 0–0.1)
EOS ABS: 0 10*3/uL (ref 0–0.7)
EOS PCT: 0 %
HCT: 44.5 % (ref 40.0–52.0)
Hemoglobin: 15 g/dL (ref 13.0–18.0)
LYMPHS ABS: 0.2 10*3/uL — AB (ref 1.0–3.6)
Lymphocytes Relative: 1 %
MCH: 29.5 pg (ref 26.0–34.0)
MCHC: 33.8 g/dL (ref 32.0–36.0)
MCV: 87.3 fL (ref 80.0–100.0)
MONOS PCT: 5 %
Monocytes Absolute: 1.3 10*3/uL — ABNORMAL HIGH (ref 0.2–1.0)
NEUTROS PCT: 94 %
Neutro Abs: 24.5 10*3/uL — ABNORMAL HIGH (ref 1.4–6.5)
PLATELETS: 289 10*3/uL (ref 150–440)
RBC: 5.1 MIL/uL (ref 4.40–5.90)
RDW: 14.2 % (ref 11.5–14.5)
WBC: 26 10*3/uL — ABNORMAL HIGH (ref 3.8–10.6)

## 2015-06-20 LAB — LIPASE, BLOOD: Lipase: 3902 U/L — ABNORMAL HIGH (ref 11–51)

## 2015-06-20 LAB — ETHANOL: Alcohol, Ethyl (B): <5 mg/dL (ref <5)

## 2015-06-20 LAB — AMMONIA: AMMONIA: 20 umol/L (ref 9–35)

## 2015-06-20 MED ORDER — IOHEXOL 300 MG/ML  SOLN
100.0000 mL | Freq: Once | INTRAMUSCULAR | Status: AC | PRN
  Administered 2015-06-20: 100 mL via INTRAVENOUS

## 2015-06-20 MED ORDER — ASPIRIN 81 MG PO CHEW
81.0000 mg | CHEWABLE_TABLET | Freq: Two times a day (BID) | ORAL | Status: DC
  Administered 2015-06-21 – 2015-06-23 (×6): 81 mg via ORAL
  Filled 2015-06-20 (×6): qty 1

## 2015-06-20 MED ORDER — IOHEXOL 240 MG/ML SOLN
25.0000 mL | Freq: Once | INTRAMUSCULAR | Status: AC | PRN
  Administered 2015-06-20: 25 mL via ORAL

## 2015-06-20 MED ORDER — SODIUM CHLORIDE 0.9 % IV BOLUS (SEPSIS)
500.0000 mL | Freq: Once | INTRAVENOUS | Status: AC
  Administered 2015-06-20: 500 mL via INTRAVENOUS

## 2015-06-20 MED ORDER — ONDANSETRON HCL 4 MG PO TABS: 4.0000 mg | ORAL_TABLET | Freq: Four times a day (QID) | ORAL | Status: DC | PRN

## 2015-06-20 MED ORDER — ACETAMINOPHEN 650 MG RE SUPP
650.0000 mg | Freq: Four times a day (QID) | RECTAL | Status: DC | PRN
Start: 2015-06-20 — End: 2015-06-23

## 2015-06-20 MED ORDER — ENOXAPARIN SODIUM 40 MG/0.4ML ~~LOC~~ SOLN
40.0000 mg | Freq: Every day | SUBCUTANEOUS | Status: DC
  Administered 2015-06-21 – 2015-06-22 (×3): 40 mg via SUBCUTANEOUS
  Filled 2015-06-20 (×3): qty 0.4

## 2015-06-20 MED ORDER — ACETAMINOPHEN 325 MG PO TABS
650.0000 mg | ORAL_TABLET | Freq: Four times a day (QID) | ORAL | Status: DC | PRN
  Administered 2015-06-23: 650 mg via ORAL
  Filled 2015-06-20: qty 2

## 2015-06-20 MED ORDER — SODIUM CHLORIDE 0.9 % IV SOLN
INTRAVENOUS | Status: DC
  Administered 2015-06-21 – 2015-06-23 (×5): via INTRAVENOUS

## 2015-06-20 MED ORDER — HYDROCODONE-ACETAMINOPHEN 5-325 MG PO TABS: 1.0000 | ORAL_TABLET | ORAL | Status: DC | PRN

## 2015-06-20 MED ORDER — ATENOLOL 25 MG PO TABS
25.0000 mg | ORAL_TABLET | Freq: Every day | ORAL | Status: DC
  Administered 2015-06-21 – 2015-06-23 (×3): 25 mg via ORAL
  Filled 2015-06-20 (×4): qty 1

## 2015-06-20 MED ORDER — ONDANSETRON HCL 4 MG/2ML IJ SOLN: 4.0000 mg | Freq: Four times a day (QID) | INTRAMUSCULAR | Status: DC | PRN

## 2015-06-20 MED ORDER — SODIUM CHLORIDE 0.9 % IV BOLUS (SEPSIS)
1000.0000 mL | Freq: Once | INTRAVENOUS | Status: AC
  Administered 2015-06-20: 1000 mL via INTRAVENOUS

## 2015-06-20 MED ORDER — PANTOPRAZOLE SODIUM 40 MG IV SOLR
40.0000 mg | INTRAVENOUS | Status: DC
  Administered 2015-06-20 – 2015-06-21 (×2): 40 mg via INTRAVENOUS
  Filled 2015-06-20 (×2): qty 40

## 2015-06-20 MED ORDER — ONDANSETRON HCL 4 MG/2ML IJ SOLN
4.0000 mg | Freq: Once | INTRAMUSCULAR | Status: AC
  Administered 2015-06-20: 4 mg via INTRAVENOUS
  Filled 2015-06-20: qty 2

## 2015-06-20 MED ORDER — MORPHINE SULFATE (PF) 2 MG/ML IV SOLN: 2.0000 mg | INTRAVENOUS | Status: DC | PRN

## 2015-06-20 MED ORDER — OLANZAPINE 10 MG PO TABS
40.0000 mg | ORAL_TABLET | Freq: Every day | ORAL | Status: DC
  Administered 2015-06-21: 40 mg via ORAL
  Filled 2015-06-20 (×5): qty 4

## 2015-06-20 MED ORDER — ADULT MULTIVITAMIN W/MINERALS CH
1.0000 | ORAL_TABLET | Freq: Every day | ORAL | Status: DC
Start: 2015-06-20 — End: 2015-06-23
  Administered 2015-06-21 – 2015-06-23 (×3): 1 via ORAL
  Filled 2015-06-20 (×4): qty 1

## 2015-06-20 NOTE — ED Notes (Signed)
CT made aware that pt has finished CT contrast.
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
## 2015-06-20 NOTE — ED Provider Notes (Signed)
[facility] Emergency Department Provider Note  ____________________________________________  Time seen: Approximately 12:03 PM  I have reviewed the triage vital signs and the nursing notes.   HISTORY  Chief Complaint Abdominal Pain    HPI Ryan [name] is a 65 y.o. male with hypertension, schizoaffective disorder, hepatitis C who presents for evaluation of right-sided abdominal pain that began yesterday, gradual onset, constant since onset, currently moderate to severe, no modifying factors. He has had episodes of nonbloody nonbilious emesis. No diarrhea. No chest pain or difficulty breathing.   Past Medical History  Diagnosis Date  . Hypertension   . Hepatitis C   . Chronic pain   . Peripheral neuropathy ([place])   . Schizophrenia [facility])     Patient Active Problem List   Diagnosis Date Noted  . Acute pancreatitis 06/20/2015  . Schizoaffective psychosis ([place]) 01/25/2015  . Hypertension 01/25/2015  . Chronic hepatitis C ([place]) 01/25/2015  . Chronic pain 01/25/2015  . Hypertensive CKD (chronic kidney disease) 01/25/2015  . Peripheral neuropathy ([place]) 01/25/2015  . Neuropathy ([place]) 01/25/2015  . Carpal tunnel syndrome on left 01/25/2015    Past Surgical History  Procedure Laterality Date  . Joint replacement Bilateral     knee  . Elbow surgery    . Toe surgery    . Shoulder fusion    . Arm amputation Right   . Abdominal surgery      spleen removed.   [name] Splenectomy      Current Outpatient Rx  Name  Route  Sig  Dispense  Refill  . aspirin 81 MG chewable tablet   Oral   Chew 81 mg by mouth 2 (two) times daily.         [name] atenolol (TENORMIN) 25 MG tablet   Oral   Take 25 mg by mouth daily.         . meloxicam (MOBIC) 7.5 MG tablet   Oral   Take 15 mg by mouth daily.          . Multiple Vitamin (MULTIVITAMIN WITH MINERALS) TABS tablet   Oral   Take 1 tablet by mouth daily.         [name] OLANZapine (ZYPREXA) 20 MG tablet   Oral    Take 40 mg by mouth at bedtime.           Allergies Review of patient's allergies indicates no known allergies.  Family History  Problem Relation Age of Onset  . Cancer Mother     bladder  . Heart disease Mother   . Hyperlipidemia Mother   . Hypertension Mother   . Diabetes Father   . Heart disease Father     MI  . Hyperlipidemia Father   . Hypertension Father   . Alcohol abuse Father     Social History Social History  Substance Use Topics  . Smoking status: Former Research scientist (life sciences)  . Smokeless tobacco: Former Systems developer  . 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     Review of Systems Constitutional: No fever/chills Eyes: No visual changes. ENT: No sore throat. Cardiovascular: Denies chest pain. Respiratory: Denies shortness of breath. Gastrointestinal: +abdominal pain.  + nausea, + vomiting.  No diarrhea.  No constipation. Genitourinary: Negative for dysuria. Musculoskeletal: Negative for back pain. Skin: Negative for rash. Neurological: Negative for headaches, focal weakness or numbness.  10-point ROS otherwise negative.  ____________________________________________   PHYSICAL EXAM:  VITAL SIGNS: ED Triage Vitals  Enc Vitals Group     BP 06/20/15 1152 103/83 mmHg     Pulse Rate 06/20/15 1152 77     Resp 06/20/15 1152 20     Temp 06/20/15 1152 97.6 F (36.4 C)     Temp Source 06/20/15 1152 Oral     SpO2 06/20/15 1152 93 %     Weight --      Height --      Head Cir --      Peak Flow --      Pain Score 06/20/15 1153 8     Pain Loc --      Pain Edu? --      Excl. in [place]? --     Constitutional: Alert and oriented. Nontoxic-appearing but appears to be in pain intermittently. Eyes: Conjunctivae are normal. Pupil in the left eye is reactive to light. Status post right eye enucleation. Head: Atraumatic. Nose: No congestion/rhinnorhea. Mouth/Throat: Mucous membranes are moist.  Oropharynx non-erythematous. Neck: No  stridor. Cardiovascular: Normal rate, regular rhythm. Grossly normal heart sounds.  Good peripheral circulation. Respiratory: Normal respiratory effort.  No retractions. Lungs CTAB. Gastrointestinal: Soft and distended with diffuse tenderness. Well-healed midline surgical scar. Genitourinary: deferred Musculoskeletal: No lower extremity tenderness nor edema.  No joint effusions. Neurologic:  Normal speech and language. No gross focal neurologic deficits are appreciated. Skin:  Skin is warm, dry and intact. No rash noted. Psychiatric: Mood is normal, affect is slightly bizarre at times and the patient begins to go off on tangents but then returns to the topic at hand.  ____________________________________________   LABS (all labs ordered are listed, but only abnormal results are displayed)  Labs Reviewed  CBC WITH DIFFERENTIAL/PLATELET - Abnormal; Notable for the following:    WBC 26.0 (*)    Neutro Abs 24.5 (*)    Lymphs Abs 0.2 (*)    Monocytes Absolute 1.3 (*)    All other components within normal limits  COMPREHENSIVE METABOLIC PANEL - Abnormal; Notable for the following:    Glucose, Bld 125 (*)    BUN 21 (*)    AST 229 (*)    ALT 219 (*)    Total Bilirubin 5.2 (*)    All other components within normal limits  LIPASE, BLOOD - Abnormal; Notable for the following:    Lipase 3902 (*)    All other components within normal limits  AMMONIA  ETHANOL  URINALYSIS COMPLETEWITH MICROSCOPIC ([facility] ONLY)  URINE DRUG SCREEN, QUALITATIVE ([facility] ONLY)  HEPATITIS PANEL, ACUTE  LIPASE, BLOOD   ____________________________________________  EKG  none ____________________________________________  RADIOLOGY  CT abdomen and pelvis IMPRESSION: Atherosclerosis of abdominal aorta without aneurysm formation.  Cholelithiasis without evidence of cholecystitis.  Findings consistent with acute pancreatitis without pseudocyst formation. Mild wall and fold thickening of second and third  portion of duodenum is noted consistent with adjacent inflammation. Probable enlarged inflammatory lymph nodes are noted in the porta hepatis region. ____________________________________________   PROCEDURES  Procedure(s) performed: None  Critical Care performed: No  ____________________________________________   INITIAL IMPRESSION / ASSESSMENT AND PLAN / ED COURSE  Pertinent labs & imaging results that were available during my care of the patient were reviewed by me and considered in my medical decision making (see chart for details).  Ryan [name] is a 65 y.o. male with hypertension, schizoaffective disorder, hepatitis C who presents for evaluation of right-sided abdominal pain that began yesterday. On exam, he has significant tenderness to palpation throughout the abdomen, worse in the right abdomen.  Labs notable for lipase elevated at 3902. Moderate elevations of AST, ALT and T bili is also elevated at 5.2. CT of the abdomen and pelvis shows acute pancreatitis, likely gallstone pancreatitis however, bile duct is not dilated on CT scan. Case discussed with hospitalist, Dr. Tressia Miners, for admission. ____________________________________________   FINAL CLINICAL IMPRESSION(S) / ED DIAGNOSES  Final diagnoses:  Generalized abdominal pain  Acute pancreatitis, unspecified pancreatitis type      Joanne Gavel, MD 06/20/15 1721

## 2015-06-20 NOTE — ED Notes (Signed)
Lab called and report they can add the hepatitis onto earlier blood draw.

## 2015-06-20 NOTE — ED Notes (Signed)
pt continues to report that he does not want narcotics.
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
## 2015-06-20 NOTE — ED Notes (Signed)
  Pt arrived via EMS from home reporting generalized abd pain and tenderness. Pt reports pain started after eating cinnamon in oatmeal. Pt reports cinnamon has caused vomiting and abd pain in the past. Pt reports vomiting x 3. Right side of abd appears to be distended and is very tender upon palpation. Pt denies difficulty having bowel movements and reports ability to pass gas. EMS also reports that pt has been telling them that he is Jesus and that God made him a women not a man. Pt also verbalized that "the light of the earth brightens when I walk outside". Pt is alert and oriented x 4.

## 2015-06-20 NOTE — ED Notes (Signed)
RN in room and for the first time since arriving to ED pt began talking about haw he can talk to the stars. Pt verbalized taht all he needs to do is look at the stars and say "wake up stars" and they will begin to shine. Pt also verbalized that he is a girl because he wears women's underwear. Pt continues to mumble under his breath but is also still A&O x 4 when asked questions. Pt also verbalized feeling cold and blanket has been provided.

## 2015-06-21 LAB — BASIC METABOLIC PANEL
Anion gap: 4 — ABNORMAL LOW (ref 5–15)
BUN: 22 mg/dL — ABNORMAL HIGH (ref 6–20)
CALCIUM: 8 mg/dL — AB (ref 8.9–10.3)
CHLORIDE: 112 mmol/L — AB (ref 101–111)
CO2: 22 mmol/L (ref 22–32)
CREATININE: 0.64 mg/dL (ref 0.61–1.24)
GFR calc non Af Amer: >60 mL/min (ref >60)
GLUCOSE: 86 mg/dL (ref 65–99)
Potassium: 3.5 mmol/L (ref 3.5–5.1)
Sodium: 138 mmol/L (ref 135–145)

## 2015-06-21 LAB — CBC
HCT: 37.9 % — ABNORMAL LOW (ref 40.0–52.0)
HEMOGLOBIN: 12.9 g/dL — AB (ref 13.0–18.0)
MCH: 30 pg (ref 26.0–34.0)
MCHC: 34.2 g/dL (ref 32.0–36.0)
MCV: 87.6 fL (ref 80.0–100.0)
PLATELETS: 246 10*3/uL (ref 150–440)
RBC: 4.32 MIL/uL — AB (ref 4.40–5.90)
RDW: 14.1 % (ref 11.5–14.5)
WBC: 15.5 10*3/uL — ABNORMAL HIGH (ref 3.8–10.6)

## 2015-06-21 LAB — LIPASE, BLOOD: Lipase: 1460 U/L — ABNORMAL HIGH (ref 11–51)

## 2015-06-21 MED ORDER — OLANZAPINE 10 MG IM SOLR
15.0000 mg | Freq: Two times a day (BID) | INTRAMUSCULAR | Status: DC
  Filled 2015-06-21 (×4): qty 20

## 2015-06-21 MED ORDER — POTASSIUM CHLORIDE CRYS ER 20 MEQ PO TBCR
40.0000 meq | EXTENDED_RELEASE_TABLET | Freq: Once | ORAL | Status: AC
  Administered 2015-06-21: 40 meq via ORAL
  Filled 2015-06-21: qty 2
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
## 2015-06-21 NOTE — Plan of Care (Signed)
Problem: Nutrition: Goal: Adequate nutrition will be maintained Outcome: Progressing Pt advanced to clear liquids today.

## 2015-06-21 NOTE — Progress Notes (Addendum)
[place] at [place] NAME: Ryan [name]    MR#:  [id number]  DATE OF BIRTH:  [birth year]  SUBJECTIVE:  CHIEF COMPLAINT:   Chief Complaint  Patient presents with  . Abdominal Pain   No abdominal pain, nausea or vomiting. REVIEW OF SYSTEMS:  CONSTITUTIONAL: No fever, fatigue or weakness.  Hungry. EYES: No blurred or double vision.  EARS, NOSE, AND THROAT: No tinnitus or ear pain.  RESPIRATORY: No cough, shortness of breath, wheezing or hemoptysis.  CARDIOVASCULAR: No chest pain, orthopnea, edema.  GASTROINTESTINAL: No nausea, vomiting, diarrhea or abdominal pain.  GENITOURINARY: No dysuria, hematuria.  ENDOCRINE: No polyuria, nocturia,  HEMATOLOGY: No anemia, easy bruising or bleeding SKIN: No rash or lesion. MUSCULOSKELETAL: No joint pain or arthritis.   NEUROLOGIC: No tingling, numbness, weakness.  PSYCHIATRY: No anxiety or depression.   DRUG ALLERGIES:  No Known Allergies  VITALS:  Blood pressure 107/59, pulse 67, temperature 98.6 F (37 C), temperature source Oral, resp. rate 18, height 5\' 5"  (1.651 m), weight 86.274 kg (190 lb 3.2 oz), SpO2 97 %.  PHYSICAL EXAMINATION:  GENERAL:  65 y.o.-year-old patient lying in the bed with no acute distress.  EYES: Pupils equal, round, reactive to light and accommodation. No scleral icterus. Extraocular muscles intact.  HEENT: Head atraumatic, normocephalic. Oropharynx and nasopharynx clear.  NECK:  Supple, no jugular venous distention. No thyroid enlargement, no tenderness.  LUNGS: Normal breath sounds bilaterally, no wheezing, rales,rhonchi or crepitation. No use of accessory muscles of respiration.  CARDIOVASCULAR: S1, S2 normal. No murmurs, rubs, or gallops.  ABDOMEN: Soft, nontender, nondistended. Bowel sounds present. No organomegaly or mass.  EXTREMITIES: No pedal edema, cyanosis, or clubbing.  NEUROLOGIC: Cranial nerves II through XII are intact. Muscle strength 5/5 in all  extremities. Sensation intact. Gait not checked.  PSYCHIATRIC: The patient is alert and oriented x 3.  SKIN: No obvious rash, lesion, or ulcer.    LABORATORY PANEL:   CBC  Recent Labs Lab 06/21/15 0507  WBC 15.5*  HGB 12.9*  HCT 37.9*  PLT 246   ------------------------------------------------------------------------------------------------------------------  Chemistries   Recent Labs Lab 06/20/15 1200 06/21/15 0507  NA 138 138  K 4.0 3.5  CL 104 112*  CO2 26 22  GLUCOSE 125* 86  BUN 21* 22*  CREATININE 0.92 0.64  CALCIUM 9.3 8.0*  AST 229*  --   ALT 219*  --   ALKPHOS 63  --   BILITOT 5.2*  --    ------------------------------------------------------------------------------------------------------------------  Cardiac Enzymes No results for input(s): TROPONINI in the last 168 hours. ------------------------------------------------------------------------------------------------------------------  RADIOLOGY:  Ct Abdomen Pelvis W Contrast  06/20/2015  CLINICAL DATA:  Acute generalized abdominal pain. EXAM: CT ABDOMEN AND PELVIS WITH CONTRAST TECHNIQUE: Multidetector CT imaging of the abdomen and pelvis was performed using the standard protocol following bolus administration of intravenous contrast. CONTRAST:  149mL OMNIPAQUE IOHEXOL 300 MG/ML  SOLN COMPARISON:  None. FINDINGS: Old L1 compression fracture is noted. Bilateral L5 pars defects are noted. Visualized lung bases are unremarkable. Cholelithiasis is noted without evidence of cholecystitis. The liver and spleen appear normal. Inflammatory changes are noted around the pancreas consistent with acute pancreatitis. Adrenal glands and kidneys appear normal. No hydronephrosis or renal obstruction is noted. No pseudocyst formation is noted. The appendix appears normal. There is no evidence of bowel obstruction. Urinary bladder appears normal. Atherosclerosis of abdominal aorta is noted without aneurysm formation. Mild  wall and fold thickening of the second and third portion of  the duodenum is noted consistent with adjacent inflammation. No biliary dilatation is noted. Enlarged lymph nodes are noted in the porta hepatis region which most likely are inflammatory in origin. IMPRESSION: Atherosclerosis of abdominal aorta without aneurysm formation. Cholelithiasis without evidence of cholecystitis. Findings consistent with acute pancreatitis without pseudocyst formation. Mild wall and fold thickening of second and third portion of duodenum is noted consistent with adjacent inflammation. Probable enlarged inflammatory lymph nodes are noted in the porta hepatis region. Electronically Signed   By: Marijo Conception, M.D.   On: 06/20/2015 14:52    EKG:   Orders placed or performed during the hospital encounter of 06/20/15  . ED EKG  . ED EKG    ASSESSMENT AND PLAN:   #1 Acute pancreatitis- no alcohol use recently. cholelithiasis is noted on CT abdomen. -Could be gallstone induced pancreatitis. -No CBD dilatation noted. Lipase is better. -IV fluids, start liquid diet. -IV pain and nausea medicines.  #2 elevated liver function tests-liver is normal on the CT. -LFTs from December 2016 are within normal limits. - patient has chronic hepatitis C, check acute hepatitis panel -No CBD dilatation noted on CT abdomen. Follow-up CMP in am.  #3 schizophrenia- induced to be at baseline. No psychosis. But has had hospitalization for psychosis with schizophrenia at [facility] about an year ago. Refused Zyprexa. See RN note.  He needs psych consult.  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 Urine drug screen is negative and serum alcohol level are pending  # 6 DVT prophylaxis-on Lovenox  #7 Duodenitis on CT- started IV protonix  Leukocytosis. Due to reaction. Better.    All the records are reviewed and case discussed with Care Management/Social  Workerr. Management plans discussed with the patient, family and they are in agreement.  CODE STATUS: full code.  TOTAL TIME TAKING CARE OF THIS PATIENT: 39 minutes.  Greater than 50% time was spent on coordination of care and face-to-face counseling.  POSSIBLE D/C IN 2 DAYS, DEPENDING ON CLINICAL CONDITION.   Demetrios Loll M.D on 06/21/2015 at 3:16 PM  Between 7am to 6pm - Pager - [phone]  After 6pm go to [email or web address] - password [email or web address] [facility]  Office  [phone]  CC: Primary care physician; Kathrine Haddock, NP

## 2015-06-22 DIAGNOSIS — F259 Schizoaffective disorder, unspecified: Secondary | ICD-10-CM

## 2015-06-22 LAB — HEMOGLOBIN A1C: Hgb A1c MFr Bld: 5 % (ref 4.0–6.0)

## 2015-06-22 LAB — COMPREHENSIVE METABOLIC PANEL
ALK PHOS: 49 U/L (ref 38–126)
ALT: 92 U/L — ABNORMAL HIGH (ref 17–63)
ANION GAP: 6 (ref 5–15)
AST: 44 U/L — ABNORMAL HIGH (ref 15–41)
Albumin: 3.2 g/dL — ABNORMAL LOW (ref 3.5–5.0)
BUN: 12 mg/dL (ref 6–20)
CALCIUM: 8.5 mg/dL — AB (ref 8.9–10.3)
CO2: 20 mmol/L — AB (ref 22–32)
Chloride: 113 mmol/L — ABNORMAL HIGH (ref 101–111)
Creatinine, Ser: 0.66 mg/dL (ref 0.61–1.24)
Glucose, Bld: 95 mg/dL (ref 65–99)
Potassium: 3.7 mmol/L (ref 3.5–5.1)
SODIUM: 139 mmol/L (ref 135–145)
Total Bilirubin: 2 mg/dL — ABNORMAL HIGH (ref 0.3–1.2)
Total Protein: 6.2 g/dL — ABNORMAL LOW (ref 6.5–8.1)

## 2015-06-22 LAB — LIPASE, BLOOD: LIPASE: 379 U/L — AB (ref 11–51)

## 2015-06-22 LAB — CBC
HEMATOCRIT: 36.9 % — AB (ref 40.0–52.0)
HEMOGLOBIN: 12.5 g/dL — AB (ref 13.0–18.0)
MCH: 29.4 pg (ref 26.0–34.0)
MCHC: 34 g/dL (ref 32.0–36.0)
MCV: 86.3 fL (ref 80.0–100.0)
PLATELETS: 231 10*3/uL (ref 150–440)
RBC: 4.27 MIL/uL — AB (ref 4.40–5.90)
RDW: 14.2 % (ref 11.5–14.5)
WBC: 13.6 10*3/uL — AB (ref 3.8–10.6)

## 2015-06-22 LAB — LIPID PANEL
CHOLESTEROL: 104 mg/dL (ref 0–200)
HDL: 26 mg/dL — AB (ref >40)
LDL CALC: 62 mg/dL (ref 0–99)
TRIGLYCERIDES: 80 mg/dL (ref <150)
Total CHOL/HDL Ratio: 4 RATIO
VLDL: 16 mg/dL (ref 0–40)

## 2015-06-22 LAB — HEPATITIS PANEL, ACUTE
HCV Ab: >11 s/co ratio — ABNORMAL HIGH (ref 0.0–0.9)
Hep A IgM: NEGATIVE
Hep B C IgM: NEGATIVE
Hepatitis B Surface Ag: NEGATIVE

## 2015-06-22 LAB — MAGNESIUM: Magnesium: 2.1 mg/dL (ref 1.7–2.4)

## 2015-06-22 MED ORDER — PANTOPRAZOLE SODIUM 40 MG PO TBEC
40.0000 mg | DELAYED_RELEASE_TABLET | Freq: Every day | ORAL | Status: DC
  Administered 2015-06-22 – 2015-06-23 (×2): 40 mg via ORAL
  Filled 2015-06-22 (×2): qty 1

## 2015-06-22 MED ORDER — OLANZAPINE 10 MG IM SOLR: 20.0000 mg | Freq: Every evening | INTRAMUSCULAR | Status: DC | PRN

## 2015-06-22 MED ORDER — LORAZEPAM 2 MG/ML IJ SOLN: 2.0000 mg | INTRAMUSCULAR | Status: DC | PRN
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
## 2015-06-22 NOTE — Consult Note (Signed)
[name] Face-to-Face Psychiatry Consult   Reason for Consult:  Consult for this 65 year old man with a history of schizophrenia or schizoaffective disorder who is currently in the [name] because of medical problems. Referring Physician:  Bridgett [name] Patient Identification: [name] MRN:  [id number] Principal Diagnosis: Schizoaffective psychosis [facility]) Diagnosis:   Patient Active Problem List   Diagnosis Date Noted  . Agitation [R45.1] 06/22/2015  . Noncompliance [Z91.19] 06/22/2015  . Acute pancreatitis [K85.90] 06/20/2015  . Schizoaffective psychosis ([place]) [F25.9] 01/25/2015  . Hypertension [I10] 01/25/2015  . Chronic hepatitis C ([place]) [B18.2] 01/25/2015  . Chronic pain [G89.29] 01/25/2015  . 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 Peripheral neuropathy ([place]) [G62.9] 01/25/2015  . Neuropathy ([place]) [G62.9] 01/25/2015  . Carpal tunnel syndrome on left [G56.02] 01/25/2015    Total Time spent with patient: 1 hour  Subjective:   [name] is a 65 y.o. male patient admitted with "I got a belly ache".  HPI:  Patient interviewed. Chart reviewed. Old chart reviewed and care everywhere reviewed as best I could. Labs reviewed. This is a 65 year old man came to the emergency room with a complaint of abdominal pain and is being treated for what appears to be pancreatitis. Patient has a history of schizophrenia. It was noted by staff taking care of him that he was bizarre and his speech making multiple bizarre and delusional statements when he came into the [name]. He does have a history of schizophrenia. Initially he had been refusing medicine. On interview today the patient was initially somewhat unwilling to talk but then grudgingly did answer some questions. He tells me he is here for a belly ache. He tells me it's been going on for about a week and getting worse. As far as his mental state he denies that he is having any hallucinations. Denies depression. Denies  suicidal or homicidal ideation. He claims that he is compliant with all of his medicines but he does not know what kind of psychiatric medicine he is taking. He is not able to tell me who the psychiatrist is that prescribes his medicine. He tells me I need to just call the pharmacy. I checked with his pharmacy and he gets Zyprexa prescribed as 40 mg at night. It was last filled just a couple weeks ago. Interestingly the patient tells me that Zyprexa is one medicine that he would never take. Denies that he's been drinking or abusing any drugs. Patient was otherwise not very forthcoming with me.  Social history: Patient tells me he lives in a home but he won't be any more detailed about it. I am unclear as to whether he lives independently or lives in a group home or just what the situation is. I am going to ask social work or case management to look into it and to confirm who his medication provider is.  Medical history: Patient has a history of high blood pressure chronic pain and peripheral neuropathy chronic hepatitis and acute pancreatitis.  Substance abuse history: Didn't want to answer many questions about this. Denies that he drinks alcohol or uses any drugs now. Won't talk about anything much from the past.  Past Psychiatric History: Patient has a long history of schizophrenia or schizoaffective disorder. Multiple prior hospitalizations. He did tell me that he was last in the [name] about 3 months ago at [name]. Patient admits that he has tried to harm himself in the past but is not very forthcoming about it right now. Yesterday  he had told staff that he actually took his own eyeball out but he wouldn't discuss it with me today and I don't have any other past records.  Unknown what medicines he has been on in the past although the 40 mg of Zyprexa appears to be most recently prescribed.  Risk to Self: Is patient at risk for suicide?: No Risk to Others:   Prior Inpatient  Therapy:   Prior Outpatient Therapy:    Past Medical History:  Past Medical History  Diagnosis Date  . Hypertension   . Hepatitis C   . Chronic pain   . Peripheral neuropathy ([place])   . Schizophrenia [facility])     Past Surgical History  Procedure Laterality Date  . Joint replacement Bilateral     knee  . Elbow surgery    . Toe surgery    . Shoulder fusion    . Arm amputation Right   . Abdominal surgery      spleen removed.   [name] Splenectomy     Family History:  Family History  Problem Relation Age of Onset  . Cancer Mother     bladder  . Heart disease Mother   . Hyperlipidemia Mother   . Hypertension Mother   . Diabetes Father   . Heart disease Father     MI  . Hyperlipidemia Father   . Hypertension Father   . Alcohol abuse Father    Family Psychiatric  History: Patient denies knowing of any family history of mental illness or substance abuse problems. 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      History  Drug Use No    Social History   Social History  . Marital Status: Divorced    Spouse Name: N/A  . Number of Children: N/A  . Years of Education: N/A   Social History Main Topics  . Smoking status: Former Research scientist (life sciences)  . Smokeless tobacco: Former Systems developer  . 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   . Drug Use: No  . Sexual Activity: Not Asked   Other Topics Concern  . None   Social History Narrative   Says lives at home by himself. Has a neighbor who checks on him. Active at baseline.   Additional Social History:    Allergies:  No Known Allergies  Labs:  Results for orders placed or performed during the [name] encounter of 06/20/15 (from the past 48 hour(s))  Urinalysis complete, with microscopic ([facility] only)     Status: Abnormal   Collection Time: 06/20/15  7:00 PM  Result Value Ref Range   Color, Urine AMBER (A) YELLOW   APPearance CLEAR (A) CLEAR    Glucose, UA NEGATIVE NEGATIVE mg/dL   Bilirubin Urine NEGATIVE NEGATIVE   Ketones, ur NEGATIVE NEGATIVE mg/dL   Specific Gravity, Urine 1.027 1.005 - 1.030   Hgb urine dipstick NEGATIVE NEGATIVE   pH 6.0 5.0 - 8.0   Protein, ur NEGATIVE NEGATIVE mg/dL   Nitrite NEGATIVE NEGATIVE   Leukocytes, UA NEGATIVE NEGATIVE   RBC / HPF 0-5 0 - 5 RBC/hpf   WBC, UA 0-5 0 - 5 WBC/hpf   Bacteria, UA RARE (A) NONE SEEN   Squamous Epithelial / LPF NONE SEEN NONE SEEN  Urine Drug Screen, Qualitative ([facility] only)     Status: None   Collection Time: 06/20/15  7:00 PM  Result Value  Ref Range   Tricyclic, Ur Screen NONE DETECTED NONE DETECTED   Amphetamines, Ur Screen NONE DETECTED NONE DETECTED   MDMA (Ecstasy)Ur Screen NONE DETECTED NONE DETECTED   Cocaine Metabolite,Ur [place] NONE DETECTED NONE DETECTED   Opiate, Ur Screen NONE DETECTED NONE DETECTED   Phencyclidine (PCP) Ur S NONE DETECTED NONE DETECTED   Cannabinoid 50 Ng, Ur [place] NONE DETECTED NONE DETECTED   Barbiturates, Ur Screen NONE DETECTED NONE DETECTED   Benzodiazepine, Ur Scrn NONE DETECTED NONE DETECTED   Methadone Scn, Ur NONE DETECTED NONE DETECTED    Comment: (NOTE) 419  Tricyclics, urine               Cutoff 1000 ng/mL 200  Amphetamines, urine             Cutoff 1000 ng/mL 300  MDMA (Ecstasy), urine           Cutoff 500 ng/mL 400  Cocaine Metabolite, urine       Cutoff 300 ng/mL 500  Opiate, urine                   Cutoff 300 ng/mL 600  Phencyclidine (PCP), urine      Cutoff 25 ng/mL 700  Cannabinoid, urine              Cutoff 50 ng/mL 800  Barbiturates, urine             Cutoff 200 ng/mL 900  Benzodiazepine, urine           Cutoff 200 ng/mL 1000 Methadone, urine                Cutoff 300 ng/mL 1100 1200 The urine drug screen provides only a preliminary, unconfirmed 1300 analytical test result and should not be used for non-medical 1400 purposes. Clinical consideration and professional judgment should 1500 be applied to any  positive drug screen result due to possible 1600 interfering substances. A more specific alternate chemical method 1700 must be used in order to obtain a confirmed analytical result.  1800 Gas chromato graphy / mass spectrometry (GC/MS) is the preferred 1900 confirmatory method.   Hepatitis panel, acute     Status: Abnormal   Collection Time: 06/20/15  7:31 PM  Result Value Ref Range   Hepatitis B Surface Ag Negative Negative   HCV Ab >11.0 (H) 0.0 - 0.9 s/co ratio    Comment: (NOTE)                                  Negative:     < 0.8                             Indeterminate: 0.8 - 0.9                                  Positive:     > 0.9 The CDC recommends that a positive HCV antibody result be followed up with a HCV Nucleic Acid Amplification test (622297). Performed At: [facility] [place], [place] [id number] Lindon Romp MD [id number]    Hep A IgM Negative Negative   Hep B C IgM Negative Negative  Lipase, blood     Status: Abnormal   Collection Time: 06/21/15  5:07 AM  Result Value Ref Range  Lipase 1460 (H) 11 - 51 U/L  Basic metabolic panel     Status: Abnormal   Collection Time: 06/21/15  5:07 AM  Result Value Ref Range   Sodium 138 135 - 145 mmol/L   Potassium 3.5 3.5 - 5.1 mmol/L   Chloride 112 (H) 101 - 111 mmol/L   CO2 22 22 - 32 mmol/L   Glucose, Bld 86 65 - 99 mg/dL   BUN 22 (H) 6 - 20 mg/dL   Creatinine, Ser 0.64 0.61 - 1.24 mg/dL   Calcium 8.0 (L) 8.9 - 10.3 mg/dL   GFR calc non Af Amer >60 >60 mL/min   GFR calc Af Amer >60 >60 mL/min    Comment: (NOTE) The eGFR has been calculated using the CKD EPI equation. This calculation has not been validated in all clinical situations. eGFR's persistently <60 mL/min signify possible Chronic Kidney Disease.    Anion gap 4 (L) 5 - 15  CBC     Status: Abnormal   Collection Time: 06/21/15  5:07 AM  Result Value Ref Range   WBC 15.5 (H) 3.8 - 10.6 K/uL   RBC 4.32 (L) 4.40 - 5.90  MIL/uL   Hemoglobin 12.9 (L) 13.0 - 18.0 g/dL   HCT 37.9 (L) 40.0 - 52.0 %   MCV 87.6 80.0 - 100.0 fL   MCH 30.0 26.0 - 34.0 pg   MCHC 34.2 32.0 - 36.0 g/dL   RDW 14.1 11.5 - 14.5 %   Platelets 246 150 - 440 K/uL  CBC     Status: Abnormal   Collection Time: 06/22/15  5:46 AM  Result Value Ref Range   WBC 13.6 (H) 3.8 - 10.6 K/uL   RBC 4.27 (L) 4.40 - 5.90 MIL/uL   Hemoglobin 12.5 (L) 13.0 - 18.0 g/dL   HCT 36.9 (L) 40.0 - 52.0 %   MCV 86.3 80.0 - 100.0 fL   MCH 29.4 26.0 - 34.0 pg   MCHC 34.0 32.0 - 36.0 g/dL   RDW 14.2 11.5 - 14.5 %   Platelets 231 150 - 440 K/uL  Magnesium     Status: None   Collection Time: 06/22/15  5:46 AM  Result Value Ref Range   Magnesium 2.1 1.7 - 2.4 mg/dL  Lipase, blood     Status: Abnormal   Collection Time: 06/22/15  5:46 AM  Result Value Ref Range   Lipase 379 (H) 11 - 51 U/L  Comprehensive metabolic panel     Status: Abnormal   Collection Time: 06/22/15  5:46 AM  Result Value Ref Range   Sodium 139 135 - 145 mmol/L   Potassium 3.7 3.5 - 5.1 mmol/L   Chloride 113 (H) 101 - 111 mmol/L   CO2 20 (L) 22 - 32 mmol/L   Glucose, Bld 95 65 - 99 mg/dL   BUN 12 6 - 20 mg/dL   Creatinine, Ser 0.66 0.61 - 1.24 mg/dL   Calcium 8.5 (L) 8.9 - 10.3 mg/dL   Total Protein 6.2 (L) 6.5 - 8.1 g/dL   Albumin 3.2 (L) 3.5 - 5.0 g/dL   AST 44 (H) 15 - 41 U/L   ALT 92 (H) 17 - 63 U/L   Alkaline Phosphatase 49 38 - 126 U/L   Total Bilirubin 2.0 (H) 0.3 - 1.2 mg/dL   GFR calc non Af Amer >60 >60 mL/min   GFR calc Af Amer >60 >60 mL/min    Comment: (NOTE) The eGFR has been calculated using the CKD EPI equation. This  calculation has not been validated in all clinical situations. eGFR's persistently <60 mL/min signify possible Chronic Kidney Disease.    Anion gap 6 5 - 15    Current Facility-Administered Medications  Medication Dose Route Frequency Provider Last Rate Last Dose  . 0.9 %  sodium chloride infusion   Intravenous Continuous Gladstone Lighter, MD  100 mL/hr at 06/22/15 1119    . acetaminophen (TYLENOL) tablet 650 mg  650 mg Oral Q6H PRN Gladstone Lighter, MD       Or  . acetaminophen (TYLENOL) suppository 650 mg  650 mg Rectal Q6H PRN Gladstone Lighter, MD      . aspirin chewable tablet 81 mg  81 mg Oral BID Gladstone Lighter, MD   81 mg at 06/22/15 1022  . atenolol (TENORMIN) tablet 25 mg  25 mg Oral Daily Gladstone Lighter, MD   25 mg at 06/22/15 1022  . enoxaparin (LOVENOX) injection 40 mg  40 mg Subcutaneous Daily Gladstone Lighter, MD   40 mg at 06/21/15 2107  . HYDROcodone-acetaminophen (NORCO/VICODIN) 5-325 MG per tablet 1-2 tablet  1-2 tablet Oral Q4H PRN Gladstone Lighter, MD      . morphine 2 MG/ML injection 2 mg  2 mg Intravenous Q4H PRN Gladstone Lighter, MD      . multivitamin with minerals tablet 1 tablet  1 tablet Oral Daily Gladstone Lighter, MD   1 tablet at 06/22/15 1022  . OLANZapine (ZYPREXA) injection 20 mg  20 mg Intramuscular QHS PRN Gonzella Lex, MD      . OLANZapine (ZYPREXA) tablet 40 mg  40 mg Oral QHS Gladstone Lighter, MD   40 mg at 06/21/15 2107  . ondansetron (ZOFRAN) tablet 4 mg  4 mg Oral Q6H PRN Gladstone Lighter, MD       Or  . ondansetron (ZOFRAN) injection 4 mg  4 mg Intravenous Q6H PRN Gladstone Lighter, MD      . pantoprazole (PROTONIX) injection 40 mg  40 mg Intravenous Q24H Gladstone Lighter, MD   40 mg at 06/21/15 1529    Musculoskeletal: Strength & Muscle Tone: decreased Gait & Station: unable to stand Patient leans: N/A  Psychiatric Specialty Exam: Review of Systems  Constitutional: Positive for malaise/fatigue.  HENT: Negative.   Eyes: Negative.   Respiratory: Negative.   Cardiovascular: Negative.   Gastrointestinal: Positive for nausea and abdominal pain.  Musculoskeletal: Negative.   Skin: Negative.   Neurological: Negative.   Psychiatric/Behavioral: Negative for depression, suicidal ideas, hallucinations, memory loss and substance abuse. The patient has insomnia. The patient  is not nervous/anxious.     Blood pressure 104/54, pulse 72, temperature 99.3 F (37.4 C), temperature source Oral, resp. rate 17, height _0  (1.651 m), weight 86.274 kg (190 lb 3.2 oz), SpO2 90 %.Body mass index is 31.65 kg/(m^2).  General Appearance: Disheveled  Eye Contact::  Minimal  Speech:  Slow and Slurred  Volume:  Decreased  Mood:  Dysphoric and Irritable  Affect:  Constricted  Thought Process:  Linear  Orientation:  Full (Time, Place, and Person)  Thought Content:  Paranoid Ideation  Suicidal Thoughts:  No  Homicidal Thoughts:  No  Memory:  Immediate;   Fair Recent;   Wouldn't participate Remote;   Wouldn't really share much information  Judgement:  Fair  Insight:  Fair  Psychomotor Activity:  Decreased  Concentration:  Fair  Recall:  Poor  Fund of Knowledge:Fair  Language: Fair  Akathisia:  No  Handed:  Right  AIMS (if indicated):  Assets:  Financial Resources/Insurance Resilience  ADL's:  Impaired  Cognition: Impaired,  Mild  Sleep:      Treatment Plan Summary: Daily contact with patient to assess and evaluate symptoms and progress in treatment, Medication management and Plan 65 year old man with a history of schizophrenia apparently with pretty severe and potentially dangerous disease. Details are still a little lacking. Certainly would be appropriate and important to try and keep him on medication. Patient is not very cooperative with the interview and I don't have a lot of other information to go on right now. I have continued the order for Zyprexa 40 mg oral at night since that is what we have documented as his most recent medicine. I have in order to replace that with 20 mg of intramuscular Zyprexa if he refuses the oral. On the other hand, it is true that Zyprexa is a rare although not completely unheard of cause of pancreatitis. If there is no other clear reason for him having pancreatitis we might want to worry about switching his medicine. Based on this I  will try and get some outpatient information about his current providers and last hospitalizations. I will continue to follow-up.  Disposition: Supportive therapy provided about ongoing stressors. Discussed crisis plan, support from social network, calling 911, coming to the Emergency Department, and calling Suicide Hotline.  Alethia Berthold, MD 06/22/2015 12:47 PM

## 2015-06-22 NOTE — Consult Note (Signed)
  Psychiatry: Follow-up on this 65 year old man with schizophrenia. Since my evaluation this morning I have spoken with social work who have gotten a lot of outside information about this patient. Nate confirms that the patient does have a history of pretty remarkable self-destruction including removing his own eyeball and burning his arm. Also that he has a history of very intense and bizarre delusions. He was at [facility] last year. Apparently recently he has been living alone. His brother seems to be his closest contact and believes that the patient has been noncompliant with medicine. Given all of these factors I have filed involuntary commitment papers on the basis of his illness and dangerousness with the likely intention of transfer to the psychiatry ward tomorrow. Dr. Tressia Miners tells me that the patient will probably be medically ready for discharge by then. I will continue to follow-up but I have ordered he advised TTS that he will probably need transfer.

## 2015-06-22 NOTE — Progress Notes (Signed)
Zyprexia PO and IM ordered. Pt had refused med on previous shift. MD was called and he advised if Pt takes the PO med hold the IM med. Pt did take the PO med

## 2015-06-22 NOTE — Care Management Important Message (Signed)
Important Message  Patient Details  Name: Quincey Eilertson MRN: [id number] Date of Birth: 04-16-1951   Medicare Important Message Given:  Yes    Juliann Pulse A Gianina Olinde 06/22/2015, 10:17 AM

## 2015-06-23 ENCOUNTER — Inpatient Hospital Stay
Admission: RE | Admit: 2015-06-23 | Discharge: 2015-06-28 | DRG: 885 | Disposition: A | Discharge status: Home / Self Care | Payer: Medicare Other | Source: Ambulatory Visit | Attending: Psychiatry | Admitting: Psychiatry

## 2015-06-23 ENCOUNTER — Inpatient Hospital Stay: Payer: Medicare Other

## 2015-06-23 DIAGNOSIS — I10 Essential (primary) hypertension: Secondary | ICD-10-CM | POA: Diagnosis present

## 2015-06-23 DIAGNOSIS — Z970 Presence of artificial eye: Secondary | ICD-10-CM | POA: Diagnosis not present

## 2015-06-23 DIAGNOSIS — Z9081 Acquired absence of spleen: Secondary | ICD-10-CM

## 2015-06-23 DIAGNOSIS — R509 Fever, unspecified: Secondary | ICD-10-CM | POA: Diagnosis not present

## 2015-06-23 DIAGNOSIS — Z79899 Other long term (current) drug therapy: Secondary | ICD-10-CM | POA: Diagnosis not present

## 2015-06-23 DIAGNOSIS — Z811 Family history of alcohol abuse and dependence: Secondary | ICD-10-CM | POA: Diagnosis not present

## 2015-06-23 DIAGNOSIS — N189 Chronic kidney disease, unspecified: Secondary | ICD-10-CM | POA: Diagnosis present

## 2015-06-23 DIAGNOSIS — Z7982 Long term (current) use of aspirin: Secondary | ICD-10-CM | POA: Diagnosis not present

## 2015-06-23 DIAGNOSIS — F203 Undifferentiated schizophrenia: Principal | ICD-10-CM

## 2015-06-23 DIAGNOSIS — S0571XA Avulsion of right eye, initial encounter: Secondary | ICD-10-CM

## 2015-06-23 DIAGNOSIS — K859 Acute pancreatitis without necrosis or infection, unspecified: Secondary | ICD-10-CM | POA: Diagnosis present

## 2015-06-23 DIAGNOSIS — I129 Hypertensive chronic kidney disease with stage 1 through stage 4 chronic kidney disease, or unspecified chronic kidney disease: Secondary | ICD-10-CM | POA: Diagnosis present

## 2015-06-23 DIAGNOSIS — S48111A Complete traumatic amputation at level between right shoulder and elbow, initial encounter: Secondary | ICD-10-CM

## 2015-06-23 DIAGNOSIS — Z9119 Patient's noncompliance with other medical treatment and regimen: Secondary | ICD-10-CM | POA: Diagnosis not present

## 2015-06-23 DIAGNOSIS — Z89221 Acquired absence of right upper limb above elbow: Secondary | ICD-10-CM | POA: Diagnosis not present

## 2015-06-23 DIAGNOSIS — B182 Chronic viral hepatitis C: Secondary | ICD-10-CM | POA: Diagnosis present

## 2015-06-23 DIAGNOSIS — Z809 Family history of malignant neoplasm, unspecified: Secondary | ICD-10-CM | POA: Diagnosis not present

## 2015-06-23 DIAGNOSIS — Z8249 Family history of ischemic heart disease and other diseases of the circulatory system: Secondary | ICD-10-CM | POA: Diagnosis not present

## 2015-06-23 DIAGNOSIS — Z833 Family history of diabetes mellitus: Secondary | ICD-10-CM

## 2015-06-23 DIAGNOSIS — K8511 Biliary acute pancreatitis with uninfected necrosis: Secondary | ICD-10-CM | POA: Diagnosis not present

## 2015-06-23 DIAGNOSIS — Z9114 Patient's other noncompliance with medication regimen: Secondary | ICD-10-CM

## 2015-06-23 DIAGNOSIS — K2980 Duodenitis without bleeding: Secondary | ICD-10-CM | POA: Diagnosis not present

## 2015-06-23 DIAGNOSIS — K219 Gastro-esophageal reflux disease without esophagitis: Secondary | ICD-10-CM | POA: Diagnosis present

## 2015-06-23 DIAGNOSIS — R45851 Suicidal ideations: Secondary | ICD-10-CM | POA: Diagnosis present

## 2015-06-23 DIAGNOSIS — Z96653 Presence of artificial knee joint, bilateral: Secondary | ICD-10-CM | POA: Diagnosis present

## 2015-06-23 DIAGNOSIS — Z87891 Personal history of nicotine dependence: Secondary | ICD-10-CM | POA: Diagnosis not present

## 2015-06-23 DIAGNOSIS — B1920 Unspecified viral hepatitis C without hepatic coma: Secondary | ICD-10-CM

## 2015-06-23 DIAGNOSIS — F209 Schizophrenia, unspecified: Secondary | ICD-10-CM

## 2015-06-23 DIAGNOSIS — I152 Hypertension secondary to endocrine disorders: Secondary | ICD-10-CM | POA: Diagnosis present

## 2015-06-23 DIAGNOSIS — G629 Polyneuropathy, unspecified: Secondary | ICD-10-CM

## 2015-06-23 DIAGNOSIS — Z9889 Other specified postprocedural states: Secondary | ICD-10-CM | POA: Diagnosis not present

## 2015-06-23 DIAGNOSIS — F22 Delusional disorders: Secondary | ICD-10-CM | POA: Diagnosis present

## 2015-06-23 DIAGNOSIS — E1159 Type 2 diabetes mellitus with other circulatory complications: Secondary | ICD-10-CM | POA: Diagnosis present

## 2015-06-23 LAB — CBC
HEMATOCRIT: 37.2 % — AB (ref 40.0–52.0)
HEMOGLOBIN: 12.7 g/dL — AB (ref 13.0–18.0)
MCH: 29.4 pg (ref 26.0–34.0)
MCHC: 34.1 g/dL (ref 32.0–36.0)
MCV: 86.2 fL (ref 80.0–100.0)
Platelets: 245 10*3/uL (ref 150–440)
RBC: 4.32 MIL/uL — AB (ref 4.40–5.90)
RDW: 13.4 % (ref 11.5–14.5)
WBC: 11 10*3/uL — AB (ref 3.8–10.6)

## 2015-06-23 LAB — BASIC METABOLIC PANEL
ANION GAP: 7 (ref 5–15)
BUN: 9 mg/dL (ref 6–20)
CHLORIDE: 109 mmol/L (ref 101–111)
CO2: 21 mmol/L — AB (ref 22–32)
Calcium: 8.3 mg/dL — ABNORMAL LOW (ref 8.9–10.3)
Creatinine, Ser: 0.63 mg/dL (ref 0.61–1.24)
GFR calc non Af Amer: >60 mL/min (ref >60)
Glucose, Bld: 92 mg/dL (ref 65–99)
Potassium: 3.4 mmol/L — ABNORMAL LOW (ref 3.5–5.1)
SODIUM: 137 mmol/L (ref 135–145)

## 2015-06-23 LAB — TSH: TSH: 2.544 u[IU]/mL (ref 0.350–4.500)

## 2015-06-23 MED ORDER — IPRATROPIUM-ALBUTEROL 0.5-2.5 (3) MG/3ML IN SOLN
3.0000 mL | Freq: Once | RESPIRATORY_TRACT | Status: AC
  Administered 2015-06-23: 3 mL via RESPIRATORY_TRACT
  Filled 2015-06-23: qty 3

## 2015-06-23 MED ORDER — MAGNESIUM HYDROXIDE 400 MG/5ML PO SUSP: 30.0000 mL | Freq: Every day | ORAL | Status: DC | PRN

## 2015-06-23 MED ORDER — FUROSEMIDE 10 MG/ML IJ SOLN
INTRAMUSCULAR | Status: AC
  Filled 2015-06-23: qty 2

## 2015-06-23 MED ORDER — FUROSEMIDE 10 MG/ML IJ SOLN
20.0000 mg | Freq: Once | INTRAMUSCULAR | Status: AC
  Administered 2015-06-23: 20 mg via INTRAVENOUS

## 2015-06-23 MED ORDER — ACETAMINOPHEN 325 MG PO TABS
650.0000 mg | ORAL_TABLET | Freq: Four times a day (QID) | ORAL | Status: DC | PRN
  Administered 2015-06-24 – 2015-06-27 (×5): 650 mg via ORAL
  Filled 2015-06-23 (×5): qty 2

## 2015-06-23 MED ORDER — ALUM & MAG HYDROXIDE-SIMETH 200-200-20 MG/5ML PO SUSP: 30.0000 mL | ORAL | Status: DC | PRN

## 2015-06-23 MED ORDER — PANTOPRAZOLE SODIUM 40 MG PO TBEC: 40.0000 mg | DELAYED_RELEASE_TABLET | Freq: Every day | ORAL | Status: DC

## 2015-06-23 MED ORDER — POTASSIUM CHLORIDE CRYS ER 20 MEQ PO TBCR
40.0000 meq | EXTENDED_RELEASE_TABLET | Freq: Once | ORAL | Status: AC
  Administered 2015-06-23: 40 meq via ORAL
  Filled 2015-06-23: qty 2

## 2015-06-23 NOTE — [facility] Suicide Risk Assessment (Signed)
[facility] Admission Suicide Risk Assessment   Nursing information obtained from:    Demographic factors:    Current Mental Status:    Loss Factors:    Historical Factors:    Risk Reduction Factors:     Total Time spent with patient: 45 minutes Principal Problem: Schizophrenia Diagnosis:   Patient Active Problem List   Diagnosis Date Noted  . Schizophrenia ([place]) [F20.9] 06/23/2015  . Agitation [R45.1] 06/22/2015  . Noncompliance [Z91.19] 06/22/2015  . Acute pancreatitis [K85.90] 06/20/2015  . Schizoaffective psychosis ([place]) [F25.9] 01/25/2015  . Hypertension [I10] 01/25/2015  . Chronic hepatitis C ([place]) [B18.2] 01/25/2015  . Chronic pain [G89.29] 01/25/2015  . 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 Peripheral neuropathy ([place]) [G62.9] 01/25/2015  . Neuropathy ([place]) [G62.9] 01/25/2015  . Carpal tunnel syndrome on left [G56.02] 01/25/2015   Subjective Data: Patient is currently denying suicidal or homicidal ideation. Has not attempted to injure himself in the hospital. Does have a history of psychotic driven self injury and suicide attempts in the past and is currently medicine noncompliant and having psychotic symptoms.  Continued Clinical Symptoms:    The "Alcohol Use Disorders Identification Test", Guidelines for Use in Primary Care, Second Edition.  World Pharmacologist [facility]). Score between 0-7:  no or low risk or alcohol related problems. Score between 8-15:  moderate risk of alcohol related problems. Score between 16-19:  high risk of alcohol related problems. Score 20 or above:  warrants further diagnostic evaluation for alcohol dependence and treatment.   CLINICAL FACTORS:   Schizophrenia:   Paranoid or undifferentiated type   Musculoskeletal: Strength & Muscle Tone: within normal limits Gait & Station: normal Patient leans: N/A  Psychiatric Specialty Exam: ROS  There were no vitals taken for this visit.There is no weight on file to  calculate BMI.  General Appearance: Disheveled  Eye Contact::  Minimal  Speech:  Slow  Volume:  Decreased  Mood:  Dysphoric  Affect:  Constricted  Thought Process:  Disorganized  Orientation:  Full (Time, Place, and Person)  Thought Content:  Delusions, Hallucinations: Auditory and Paranoid Ideation  Suicidal Thoughts:  No  Homicidal Thoughts:  No  Memory:  Immediate;   Fair Recent;   Poor Remote;   Fair  Judgement:  Impaired  Insight:  Fair  Psychomotor Activity:  Decreased  Concentration:  Fair  Recall:  Poor  Fund of Knowledge:Fair  Language: Poor  Akathisia:  No  Handed:  Right  AIMS (if indicated):     Assets:  Financial Resources/Insurance Housing Resilience Social Support  Sleep:     Cognition: WNL  ADL's:  Intact    COGNITIVE FEATURES THAT CONTRIBUTE TO RISK:  Closed-mindedness    SUICIDE RISK:   Mild:  Suicidal ideation of limited frequency, intensity, duration, and specificity.  There are no identifiable plans, no associated intent, mild dysphoria and related symptoms, good self-control (both objective and subjective assessment), few other risk factors, and identifiable protective factors, including available and accessible social support.  PLAN OF CARE: Patient is currently denying suicidal ideation but has multiple prior attempts and a history of psychosis that is untreated. Patient will be continued on antipsychotic medication. Treatment team can work on appropriate management of symptoms and illness. Patient's suicidal thinking and behavior will be continually monitored before any planned discharge. On continuous observation at this point.  I certify that inpatient services furnished can reasonably be expected to improve the patient's condition.   Alethia Berthold, MD 06/23/2015, 5:32 PM
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
## 2015-06-23 NOTE — Progress Notes (Signed)
Patient ID: Ryan [name], male   DOB: 1950-07-23, 65 y.o.   MRN: [id number]  Pt admitted from [facility], per report pt having delusions, states that he is a male and believes he is pregnant, per pt he states that he identifies as bot male and male genders, displays some religious preoccupation during admission, states that a scar to his right leg is "God's eye" and a scar to left knee is "the devil", denies SI/HI/AVH, pt was talking to himself when writer returned to the patients room with a pillow, pleasant and cooperative throughout the admission process, skin intact, no contraband found, oriented pt to unit and rules, ordered pt dinner.

## 2015-06-23 NOTE — Tx Team (Signed)
Initial Interdisciplinary Treatment Plan   PATIENT STRESSORS: Health problems Medication change or noncompliance   PATIENT STRENGTHS: Active sense of humor Capable of independent living   PROBLEM LIST: Problem List/Patient Goals Date to be addressed Date deferred Reason deferred Estimated date of resolution  Psychosis      Delusions                                                 DISCHARGE CRITERIA:  Ability to meet basic life and health needs Improved stabilization in mood, thinking, and/or behavior Motivation to continue treatment in a less acute level of care Reduction of life-threatening or endangering symptoms to within safe limits Verbal commitment to aftercare and medication compliance  PRELIMINARY DISCHARGE PLAN: Attend aftercare/continuing care group Outpatient therapy Return to previous living arrangement  PATIENT/FAMIILY INVOLVEMENT: This treatment plan has been presented to and reviewed with the patient, Ryan [name].  The patient and family have been given the opportunity to ask questions and make suggestions.  Dola Factor 06/23/2015, 6:00 PM

## 2015-06-23 NOTE — Progress Notes (Signed)
PT Cancellation Note  Patient Details Name: Ryan [name] MRN: [id number] DOB: 1950/11/26   Cancelled Treatment:    Reason Eval/Treat Not Completed: Fatigue/lethargy limiting ability to participate;Patient declined, no reason specified; Pt reporting that he just got up and ambulated to the bathroom and that he just took his medication and does not feel well and does not want to get up.  Pt also reports feeling tricked/lied to and is angry about going to Behavioral Medicine instead of going home.  Will re-attempt at a later date or as appropriate.  [name] A Beauregard Jarrells, PT 06/23/2015, 9:56 AM

## 2015-06-23 NOTE — Progress Notes (Signed)
one on one sitter provided for IVC. Patient was notified that he will be going downstairs or transferred to behavioral medicine. Patient was mad and angry' "the psychiatrist tricked him on to signing the paper". He wanted to talk to Dr. Weber Cooks now. I told him, It is not possible right at the moment. Eventually he agreed to go down to behavioral unit after he eat his lunch. I am waiting for bed availability at this time.

## 2015-06-23 NOTE — Progress Notes (Signed)
Patient was transported to [facility] unit accompanied by Animal nutritionist and the sitter. No sign of acute distress noted at this time.

## 2015-06-23 NOTE — Progress Notes (Signed)
PT Cancellation Note  Patient Details Name: Ryan [name] MRN: [id number] DOB: 1950-07-08   Cancelled Treatment:    Reason Eval/Treat Not Completed: Other (comment) (Patient noted with discharge plans to [facility].  Will require new orders to initiate PT services upon admission to [facility].  Please re-consult once transfer complete and patient appropriate for evaluation.)   Bettye Sitton H. Owens Shark, PT, DPT, NCS 06/23/2015, 5:01 PM [phone]
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
## 2015-06-23 NOTE — Progress Notes (Signed)
Patient was tachycardic up to 120 Heart rate, O2 sats 92% RA but auditory  wheezing noted post exertion. Dr. Tressia Miners was  notified and ordered duoneb for wheezing.

## 2015-06-24 MED ORDER — OLANZAPINE 10 MG PO TABS
20.0000 mg | ORAL_TABLET | Freq: Every day | ORAL | Status: DC
  Administered 2015-06-24 – 2015-06-25 (×2): 20 mg via ORAL
  Filled 2015-06-24 (×2): qty 2

## 2015-06-24 MED ORDER — PANTOPRAZOLE SODIUM 40 MG PO TBEC
40.0000 mg | DELAYED_RELEASE_TABLET | Freq: Every day | ORAL | Status: DC
  Administered 2015-06-24 – 2015-06-28 (×5): 40 mg via ORAL
  Filled 2015-06-24 (×5): qty 1

## 2015-06-24 MED ORDER — ATENOLOL 25 MG PO TABS
25.0000 mg | ORAL_TABLET | Freq: Every day | ORAL | Status: DC
  Administered 2015-06-24 – 2015-06-28 (×5): 25 mg via ORAL
  Filled 2015-06-24 (×7): qty 1

## 2015-06-24 MED ORDER — ASPIRIN EC 81 MG PO TBEC
162.0000 mg | DELAYED_RELEASE_TABLET | Freq: Every day | ORAL | Status: DC
  Administered 2015-06-24 – 2015-06-28 (×5): 162 mg via ORAL
  Filled 2015-06-24 (×5): qty 2

## 2015-06-24 NOTE — Progress Notes (Signed)
D: Observed pt in room lying in bed. Patient alert and oriented x4. Patient denies SI/HI/AVH. Pt affect is blunted and irritable. Pt stated "I don't want to talk..I don't need to be here." Pt indicated he did not want to attend groups or interact on the unit "I just want to rest." Pt denied feeling depressed or anxious. Pt stated his mood is "good.Rene Paci got no problems." A: Offered active listening and support. Provided therapeutic communication. Encouraged pt to attend groups and actively participate in care. R: Pt cooperative. No meds this pm. Will continue Q15 min. checks. Safety maintained.

## 2015-06-24 NOTE — Plan of Care (Signed)
Problem: Ineffective individual coping Goal: STG: Patient will remain free from self harm Outcome: Progressing Pt remains free from self harm.      
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
## 2015-06-24 NOTE — [facility] Group Notes (Signed)
[place] LCSW Group Therapy  06/24/2015 3:32 PM  Type of Therapy:  Group Therapy  Participation Level:  Minimal  Participation Quality:  Attentive  Affect:  Irritable  Cognitive:  Alert  Insight:  Limited  Engagement in Therapy:  Limited  Modes of Intervention:  Discussion, Education, Socialization and Support  Summary of Progress/Problems:Boundaries: Patients defined boundaries and discussed the importance of them. Patients identified their own boundaries and how they feel when they are crossed. Patients discussed ways to create and/ or improve their personal boundaries.  Ryan [name] attended group and stayed the entire time. He sat quietly and listened to other group members.   [place] MSW, [place]  06/24/2015, 3:32 PM

## 2015-06-24 NOTE — Progress Notes (Signed)
Pt has been pleasant and cooperative.Pt did attend some unit activities but has been mostly seclusive to his room. Pt's mood and affect has been pleasant. Pt denies SI and A/V hallucinations.

## 2015-06-25 DIAGNOSIS — F203 Undifferentiated schizophrenia: Secondary | ICD-10-CM

## 2015-06-25 NOTE — [facility] Group Notes (Signed)
[facility] LCSW Group Therapy  06/25/2015 2:40 PM  Type of Therapy:  Group Therapy  Participation Level:  Did Not Attend  Modes of Intervention:  Activity, Education, Exploration, Socialization and Support  Summary of Progress/Problems: Feelings around diagnosis: Patients discussed what mental health means to them and how it has impacted their lives. Patients discussed receiving a diagnosis and how it made them feel. They were encouraged to share experiences related to mental health diagnosis and how other people have reacted.   Tavis Kring L Heaton Sarin 06/25/2015, 2:40 PM   

## 2015-06-25 NOTE — [facility] Group Notes (Signed)
[facility] Group Notes:  (Nursing/MHT/Case Management/Adjunct)  Date:  06/25/2015  Time:  8:30 AM  Type of Therapy:  Community Meetin g  Participation Level:  Did Not Attend  Ryan [name] 06/25/2015, 6:06 PM

## 2015-06-25 NOTE — Procedures (Signed)
Pt has been pleasant and cooperative.Pt did attend some unit activities but has been mostly seclusive to his room. Pt's mood and affect has been pleasant. Pt denies SI and A/V hallucinations
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
## 2015-06-25 NOTE — Progress Notes (Signed)
Ryan [name] was cooperative on shift. He was visible in the milieu, he also took medication and was complaint with treatment. No behavioral issues were noted on shift thus far. He appears to be resting in bed quietly at this time.

## 2015-06-26 DIAGNOSIS — B1920 Unspecified viral hepatitis C without hepatic coma: Secondary | ICD-10-CM

## 2015-06-26 MED ORDER — OLANZAPINE 10 MG PO TABS
40.0000 mg | ORAL_TABLET | Freq: Every day | ORAL | Status: DC
  Administered 2015-06-26 – 2015-06-27 (×2): 40 mg via ORAL
  Filled 2015-06-26 (×2): qty 4

## 2015-06-26 NOTE — Plan of Care (Signed)
Problem: Alteration in thought process Goal: LTG-Patient behavior demonstrates decreased signs psychosis (Patient behavior demonstrates decreased signs of psychosis to the point the patient is safe to return home and continue treatment in an outpatient setting.)  Outcome: Progressing Decreased psychosis noted.

## 2015-06-26 NOTE — Progress Notes (Signed)
D:  Patient is pleasant and cooperative.  Denies SI/HI/AVH.  Denies depression.  Interacting with peers and staff appropriately.  No inappropriate behavior noted.  A:  Support and encouragement offered.  Safety maintained. Medicated x 1 for back pain with good results.  Medications administered according to orders.  Encouraged group attendance.  R:  Receptive to treatment plan.  Medication and group compliant.

## 2015-06-26 NOTE — BHH Group Notes (Signed)
[place] LCSW Group Therapy  06/26/2015 3:09 PM  Type of Therapy:  Group Therapy  Participation Level:  Minimal  Participation Quality:  Attentive and Off topic  Affect:  Appropriate  Cognitive:  Disorganized  Insight:  Limited  Engagement in Therapy:  Limited  Modes of Intervention:  Socialization and Support  Summary of Progress/Problems:Patient attended and participated in group discussion but was off topic and disorganized slightly but redirectable. Patient introduced himself and sharing that during an introductory exercise that his 'dream vacation' spot would be "[place]" and patient explained that he likes the cold weather. Patient was attentive throughout group discussion but was not able to stay focused on the topic but was able to remain in the group.    Keene Breath, MSW, LCSWA 06/26/2015, 3:09 PM

## 2015-06-26 NOTE — Progress Notes (Signed)
Recreation Therapy Notes  INPATIENT RECREATION THERAPY ASSESSMENT  Patient Details Name: Ryan [name] MRN: [id number] DOB: 04/28/1951 Today's Date: 06/26/2015  Patient Stressors: Other (Comment) Personnel officer)  Coping Skills:   Avoidance, Exercise, Talking, Music, Sports, Other (Comment) (Look the other way)  Personal Challenges: Expressing Yourself, Stress Management, Trusting Others  Leisure Interests (2+):  Individual - TV, Individual - Other (Comment) (Clean house)  Awareness of Community Resources:  No  Community Resources:     Current Use:    If no, Barriers?:    Patient Strengths:  Positive attitude, loving  Patient Identified Areas of Improvement:  Hair  Current Recreation Participation:  Working in the house  Patient Goal for Hospitalization:  To get back on the right track  [place] of Residence:  [place] of Residence:  [place]   Current SI (including self-harm):  No  Current HI:  No  Consent to Intern Participation: N/A   Leonette Monarch, LRT/CTRS 06/26/2015, 2:07 PM
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
## 2015-06-26 NOTE — BHH Group Notes (Signed)
[facility] LCSW Aftercare Discharge Planning Group Note   06/26/2015 12:31 PM  Participation Quality:  Patient arrived 20 minutes late for group but did participate in group discussion sharing that his SMART goal is to "be happy and take a shower".   Mood/Affect:  Appropriate  Depression Rating:  0/10  Anxiety Rating:  0/10  Thoughts of Suicide:  No Will you contract for safety?   NA  Current AVH:  No  Plan for Discharge/Comments:  Patient will discharge home with outpatient follow up  Transportation Means: patient has a cousin that will pick him up   Keene Breath, MSW, [employer]
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
## 2015-06-26 NOTE — Plan of Care (Signed)
Problem: Alteration in thought process Goal: LTG-Patient has not harmed self or others in at least 2 days Outcome: Progressing No self harm.  No verbalization of thoughts to harm himself.

## 2015-06-26 NOTE — BHH Group Notes (Signed)
[place] Group Notes:  (Nursing/MHT/Case Management/Adjunct)  Date:  06/26/2015  Time:  1:11 PM  Type of Therapy:  Psychoeducational Skills  Participation Level:  Minimal  Participation Quality:  Inattentive  Affect:  Flat  Cognitive:  Disorganized  Insight:  Limited  Engagement in Group:  Lacking and Limited  Modes of Intervention:  Discussion and Education  Summary of Progress/Problems:  Ryan [name] 06/26/2015, 1:11 PM

## 2015-06-26 NOTE — Progress Notes (Signed)
Recreation Therapy Notes  Date: 02.06.17 Time: 3:00 pm Location: Craft Room  Group Topic: Wellness  Goal Area(s) Addresses:  Patient will identify at least one item per dimension of health. Patient will examine areas they are deficient in.  Behavioral Response: Did not attend  Intervention: 6 Dimensions of Health  Activity: Patients were given a sheets with the definitions of the 6 Dimensions of Health on it and a worksheet with the 6 Dimensions written out. Patients were instructed to write 2-3 things they are currently doing in each category.  Education: LRT educated patients on ways they can increase areas they are deficient in.  Education Outcome: Patient did not attend group.  Clinical Observations/Feedback: Patient did not attend group.         Leonette Monarch 06/26/2015 4:14 PM

## 2015-06-26 NOTE — Progress Notes (Signed)
D: Pt denies SI/HI/AVH. Pt is pleasant and cooperative. Patient  is interacting with peers and staff appropriately.  A: Pt was offered support and encouragement. Pt was given scheduled medications. Pt was encouraged to attend groups. Q 15 minute checks were done for safety.  R:Pt attends groups and interacts well with peers and staff. Pt is taking medication. Pt has no complaints.Pt receptive to treatment and safety maintained on unit.

## 2015-06-27 NOTE — Progress Notes (Signed)
Recreation Therapy Notes  Date: 02.07.17 Time: 3:00 pm Location: Craft Room  Group Topic: Self-expression  Goal Area(s) Addresses:  Patient will be able to identify a color that represents each emotion. Patient will verbalize benefit of using art as a means of self-expression. Patient will verbalize one emotion experienced while participating in activity.  Behavioral Response: Left early  Intervention: The Colors Within Me  Activity: Patients were given a blank face worksheet and instructed to analyze the emotions they were experiencing, pick a color for each emotion, and show on the face how much of that emotion they were experiencing.  Education: LRT educated patients on different forms of self-expression.  Education Outcome: Patient left before LRT educated group.  Clinical Observations/Feedback: Patient left group at approximately 3:10 pm. Patient did not return to group.  Leonette Monarch, LRT/CTRS 06/27/2015 4:10 PM

## 2015-06-27 NOTE — BHH Group Notes (Signed)
[place] Group Notes:  (Nursing/MHT/Case Management/Adjunct)  Date:  06/27/2015  Time:  3:57 AM  Type of Therapy:  Group Therapy  Participation Level:  Active  Participation Quality:  Appropriate  Affect:  Appropriate  Cognitive:  Appropriate  Insight:  Appropriate  Engagement in Group:  Engaged  Modes of Intervention:  n/a  Summary of Progress/Problems:  Ryan [name] 06/27/2015, 3:57 AM

## 2015-06-27 NOTE — Tx Team (Signed)
Interdisciplinary Treatment Plan Update (Adult)  Date:  06/27/2015 Time Reviewed:  4:10 PM  Progress in Treatment: Attending groups: No. Participating in groups:  No. Taking medication as prescribed:  Yes. Tolerating medication:  Yes. Family/Significant othe contact made:  Will contact brother, [name] Patient understands diagnosis:  Yes. Discussing patient identified problems/goals with staff:  Yes. Medical problems stabilized or resolved:  Yes. Denies suicidal/homicidal ideation: Yes. Issues/concerns per patient self-inventory:  No. Other:  New problem(s) identified: No, Describe:     Discharge Plan or Barriers: Discharge home to [facility], follow up with [facility], possible referral to [facility] if funding.  Reason for Continuation of Hospitalization: Delusions  Hallucinations Medication stabilization  Comments:Patient is a 65 year old man with a history of schizophrenia who came to the hospital for abdominal pain and was diagnosed with acute pancreatitis. He was also noticed on admission to be grossly bizarre. Made multiple bizarre psychotic statements. Was initially noncompliant cooperative with treatment but eventually cooperated with treatment of pancreatitis but was refusing psychiatric medicine. Some collateral information we have suggests that it's very likely that he has been taking his psychiatric medicine. He has a history of noncompliance. The patient himself minimizes his symptoms denies suicidal ideation and says that he doesn't think there is anything wrong with him. He admits that he takes his medication intermittently.  Estimated length of stay: up to 3 days  New goal(s):increasing anti-psychotic back to regular dose  Review of initial/current patient goals per problem list:   1.  Goal(s):Patient will participate in aftercare plan  Met:  Yes  Target date:at discharge  As evidenced OZ:[id number] aftercare provider and housing plan will be identified at  discharge. 06/26/2014-Pt identifies [facility] as provider and his home in [place] as his housing plan. 2.  Goal (s):At discharge patient will exhibit less bizarre delusional content or will be noted to be at baseline by MD or safe to discharge.  Met:  No  Target date:at discharge  As evidenced by:  3.  Goal(s):Patient will exhibit decreased depressive symptoms  Met:  No  Target date:at discharge  As evidenced by: Pt will utilize self-screening of depression at 3 or below and demonstrate decreased signs of depression or be deemed stable for discharge by MD.   Attendees: Patient:  Ryan [name] 2/7/20174:10 PM  Family:   2/7/20174:10 PM  Physician:  Merlyn Albert 2/7/20174:10 PM  Nursing:   Elige Radon, RN 2/7/20174:10 PM  Case Manager:   2/7/20174:10 PM  Counselor:  Dossie Arbour, LCSW 2/7/20174:10 PM  Other:  Everitt Amber, [place] 2/7/20174:10 PM  Other:   2/7/20174:10 PM  Other:   2/7/20174:10 PM  Other:  2/7/20174:10 PM  Other:  2/7/20174:10 PM  Other:  2/7/20174:10 PM  Other:  2/7/20174:10 PM  Other:  2/7/20174:10 PM  Other:  2/7/20174:10 PM  Other:   2/7/20174:10 PM   Scribe for Treatment Team:   August Saucer, 06/27/2015, 4:10 PM,MSW, LCSW

## 2015-06-27 NOTE — Plan of Care (Signed)
Problem: Alteration in thought process Goal: LTG-Patient behavior demonstrates decreased signs psychosis (Patient behavior demonstrates decreased signs of psychosis to the point the patient is safe to return home and continue treatment in an outpatient setting.)  Outcome: Not Progressing Alert and oriented.  No signs of pyschosis

## 2015-06-27 NOTE — Plan of Care (Signed)
Problem: Ineffective individual coping Goal: STG: Pt will be able to identify effective and ineffective STG: Pt will be able to identify effective and ineffective coping patterns  Outcome: Progressing Pt states that he has begun to pray as a coping skill  Problem: Alteration in thought process Goal: STG-Patient is able to follow short directions Outcome: Progressing Pt follows directions from staff

## 2015-06-27 NOTE — BHH Group Notes (Signed)
[place] Group Notes:  (Nursing/MHT/Case Management/Adjunct)  Date:  06/27/2015  Time:  2:04 PM  Type of Therapy:  Psychoeducational Skills  Participation Level:  Did Not Attend  Ryan [name] 06/27/2015, 2:04 PM

## 2015-06-27 NOTE — Progress Notes (Signed)
Pleasant and cooperative. Denies SI/HI/AVH.  Medication and group compliant.  Support and encouragement offered.  Safety maintained.

## 2015-06-27 NOTE — BHH Group Notes (Signed)
[place] LCSW Group Therapy  06/27/2015 2:48 PM  Type of Therapy:  Group Therapy  Participation Level:  Active  Participation Quality:  Attentive  Affect:  Excited  Cognitive:  Alert and Disorganized  Insight:  Limited and Monopolizing  Engagement in Therapy:  Limited and Monopolizing  Modes of Intervention:  Socialization and Support  Summary of Progress/Problems: Patient attended group and introduced himself sharing that his hobby use to be "racing go carts" but is not able to do this anymore with his physical limitations. Patient participated in group discussion but was off topic but was redirectable.   Keene Breath, MSW, LCSWA 06/27/2015, 2:48 PM

## 2015-06-27 NOTE — Progress Notes (Signed)
D: Pt is pleasant and cooperative this evening. He is seen in the milieu interacting with peers. Denies SI/HI/AVH at this time. Pt c/o pain rated a 6 out of 10 and requests PRN medication. Pt states that his goal for today was to "be happy and more positive." Pt reports that he achieved this goal by praying for the less fortunate.  A: Emotional support and encouragement provided. Medications administered as prescribed. q15 minute safety checks maintained. R: Pt remains free from harm.
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
## 2015-06-27 NOTE — Plan of Care (Signed)
Problem: [facility] Participation in Recreation Therapeutic Interventions Goal: STG-Other Recreation Therapy Goal (Specify) STG: Stress Management - Within 4 treatment sessions, patient will verbalize understanding of the stress management techniques in each of 2 treatment sessions to increase stress management skills post d/c.  Outcome: Progressing Treatment Session 1; Completed 1 out of 2: At approximately 9:30 am, LRT met with patient in patient room. LRT educated and provided patient with handouts on stress management techniques. Patient verbalized understanding. LRT encouraged patient to read over and practice the stress management techniques. Intervention Used: Stress Management handouts  Leonette Monarch, LRT/CTRS 02.07.17 11:45 am

## 2015-06-27 NOTE — BHH Group Notes (Signed)
[place] Group Notes:  (Nursing/MHT/Case Management/Adjunct)  Date:  06/27/2015  Time:  10:21 PM  Type of Therapy:  Evening Wrap-up Group  Participation Level:  Did Not Attend  Participation Quality:  N/A  Affect:  N/A  Cognitive:  N/A  Insight:  None  Engagement in Group:  N/A  Modes of Intervention:  N/A  Summary of Progress/Problems:  Levonne Spiller 06/27/2015, 10:21 PM

## 2015-06-28 DIAGNOSIS — S0571XA Avulsion of right eye, initial encounter: Secondary | ICD-10-CM

## 2015-06-28 DIAGNOSIS — S48111A Complete traumatic amputation at level between right shoulder and elbow, initial encounter: Secondary | ICD-10-CM

## 2015-06-28 DIAGNOSIS — Z89221 Acquired absence of right upper limb above elbow: Secondary | ICD-10-CM

## 2015-06-28 MED ORDER — OLANZAPINE 20 MG PO TABS: 40.0000 mg | ORAL_TABLET | Freq: Every day | ORAL | Status: AC

## 2015-06-28 NOTE — Plan of Care (Signed)
Problem: [facility] Participation in Recreation Therapeutic Interventions Goal: STG-Other Recreation Therapy Goal (Specify) STG: Stress Management - Within 4 treatment sessions, patient will verbalize understanding of the stress management techniques in each of 2 treatment sessions to increase stress management skills post d/c.  Outcome: Completed/Met Date Met:  06/28/15 Treatment Session 2; Completed 2 out of 2: At approximately 11:15 am, LRT met with patient in patient room. Patient verbalized understanding of the stress management techniques. LRT encouraged patient to use the techniques post d/c. Intervention Used: Stress Management handouts  Leonette Monarch, LRT/CTRS 02.08.17 11:48 am

## 2015-06-28 NOTE — Progress Notes (Signed)
  [facility] Adult Case Management Discharge Plan :  Will you be returning to the same living situation after discharge:  Yes,    At discharge, do you have transportation home?: Yes,   Neighbor to transport Do you have the ability to pay for your medications: Yes,     Release of information consent forms completed and in the chart;  Patient's signature needed at discharge.  Patient to Follow up at: Follow-up Information    Follow up with [facility] In 1 day.   Why:  Your hospital follow up appointment will be walk in. Walk in hours are Monday through Friday between 8:00am and 10:00am.    Contact information:   [address] [facility] [address] [phone]       Next level of care provider has access to [place] and Suicide Prevention discussed: Yes,     Have you used any form of tobacco in the last 30 days? (Cigarettes, Smokeless Tobacco, Cigars, and/or Pipes): No  Has patient been referred to the Quitline?: N/A patient is not a smoker  Patient has been referred for addiction treatment: N/A  August Saucer 06/28/2015, 4:56 PM

## 2015-06-28 NOTE — Progress Notes (Signed)
D: Pt affect is irritable this evening. Upon approach, pt states that his goal for today was "to work on forgiveness." Pt did not elaborate on anyone in particular he was trying to forgive. Pt denies SI/HI/AVH at this time, stating "are you kidding me? No!" Denies pain.  A: Emotional support and encouragement provided. Medications administered as prescribed. q15 minute safety checks maintained. R: Pt remains free from harm
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
## 2015-06-28 NOTE — BHH Group Notes (Signed)
[facility] LCSW Aftercare Discharge Planning Group Note   06/28/2015 3:02 PM  Participation Quality:  Patient arrived 20 minutes late for group but introduced himself and was not able to offer a SMART goal as he was late. Patient was attentive throughout group.    Mood/Affect:  Blunted  Depression Rating:  0  Anxiety Rating:  2  Thoughts of Suicide:  No Will you contract for safety?   NA  Current AVH:  No   Keene Breath, MSW, [employer]

## 2015-06-28 NOTE — Progress Notes (Signed)
Pleasant and cooperative.  Denies SI/HI/AVH.  Denies depression.  Medication and group compliant.  Discharge instructions given.  Verbalized understanding.  Prescriptions given.  Escorted off unit by this Probation officer to waiting to meet neighbor to travel home.

## 2015-06-28 NOTE — BHH Suicide Risk Assessment (Signed)
[facility] Discharge Suicide Risk Assessment   Principal Problem: Undifferentiated schizophrenia [facility]) Discharge Diagnoses:  Patient Active Problem List   Diagnosis Date Noted  . Hepatitis C [B19.20] 06/26/2015  . Undifferentiated schizophrenia ([place]) [F20.3]   . Hypertension [I10] 01/25/2015  . Chronic pain [G89.29] 01/25/2015  . 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 Peripheral neuropathy ([place]) [G62.9] 01/25/2015  . Neuropathy ([place]) [G62.9] 01/25/2015  . Carpal tunnel syndrome on left [G56.02] 01/25/2015    Total Time spent with patient: 30 minutes   Psychiatric Specialty Exam: ROS                                                         Mental Status Per Nursing Assessment::   On Admission:  NA  Demographic Factors:  Male, Caucasian and Living alone  Loss Factors: NA  Historical Factors: Prior suicide attempts and Impulsivity  Risk Reduction Factors:   stable living situation, has access to care  Continued Clinical Symptoms:  Previous Psychiatric Diagnoses and Treatments  Cognitive Features That Contribute To Risk:  None    Suicide Risk:  Minimal: No identifiable suicidal ideation.  Patients presenting with no risk factors but with morbid ruminations; may be classified as minimal risk based on the severity of the depressive symptoms  Follow-up Information    Follow up with [facility] In 1 day.   Why:  Your hospital follow up appointment will be walk in. Walk in hours are Monday through Friday between 8:00am and 10:00am.    Contact information:   [address] [place] [address] [phone]        Hildred Priest, MD 06/28/2015, 9:09 AM

## 2015-06-28 NOTE — Progress Notes (Signed)
Recreation Therapy Notes  INPATIENT RECREATION TR PLAN  Patient Details Name: Kostas Marrow MRN: [id number] DOB: 30-Jun-1950 Today's Date: 06/28/2015  Rec Therapy Plan Is patient appropriate for Therapeutic Recreation?: Yes Treatment times per week: At least once a week TR Treatment/Interventions: 1:1 session, Group participation (Comment) (Appropriate participation in daily recreation therapy tx)  Discharge Criteria Pt will be discharged from therapy if:: Treatment goals are met, Discharged Treatment plan/goals/alternatives discussed and agreed upon by:: Patient/family  Discharge Summary Short term goals set: See Care Plan Short term goals met: Complete Progress toward goals comments: One-to-one attended One-to-one attended: Stress Management Reason goals not met: N/A Therapeutic equipment acquired: None Reason patient discharged from therapy: Discharge from hospital Pt/family agrees with progress & goals achieved: Yes Date patient discharged from therapy: 06/28/15   Leonette Monarch, LRT/CTRS 06/28/2015, 2:52 PM

## 2015-06-28 NOTE — BHH Group Notes (Signed)
[facility] Group Notes:  (Nursing/MHT/Case Management/Adjunct)  Date:  06/28/2015  Time:  2:02 PM  Type of Therapy:  Psychoeducational Skills  Participation Level:  Did Not Attend  Adela Lank [facility] 06/28/2015, 2:02 PM

## 2015-06-28 NOTE — Plan of Care (Signed)
Problem: Alteration in thought process Goal: LTG-Patient has not harmed self or others in at least 2 days Outcome: Progressing Pt remains free from harm Goal: LTG-Patient is able to perceive the environment accurately Outcome: Progressing Pt is alert and oriented x4

## 2015-06-28 NOTE — BHH Suicide Risk Assessment (Signed)
[place] INPATIENT:  Family/Significant Other Suicide Prevention Education  Suicide Prevention Education:  Education Completed; Rickard Patience, Brother,  (name of family member/significant other) has been identified by the patient as the family member/significant other with whom the patient will be residing, and identified as the person(s) who will aid the patient in the event of a mental health crisis (suicidal ideations/suicide attempt).  With written consent from the patient, the family member/significant other has been provided the following suicide prevention education, prior to the and/or following the discharge of the patient.  The suicide prevention education provided includes the following:  Suicide risk factors  Suicide prevention and interventions  National Suicide Hotline telephone number  [facility] assessment telephone number  [facility] Emergency Assistance [place] and/or Residential Mobile Crisis Unit telephone number  Request made of family/significant other to:  Remove weapons (e.g., guns, rifles, knives), all items previously/currently identified as safety concern.    Remove drugs/medications (over-the-counter, prescriptions, illicit drugs), all items previously/currently identified as a safety concern.  The family member/significant other verbalizes understanding of the suicide prevention education information provided.  The family member/significant other agrees to remove the items of safety concern listed above.  August Saucer 06/28/2015, 4:55 PM

## 2015-07-04 ENCOUNTER — Other Ambulatory Visit: Payer: Self-pay | Admitting: Unknown Physician Specialty

## 2015-07-04 DIAGNOSIS — F251 Schizoaffective disorder, depressive type: Secondary | ICD-10-CM | POA: Diagnosis not present

## 2015-07-05 ENCOUNTER — Ambulatory Visit (INDEPENDENT_AMBULATORY_CARE_PROVIDER_SITE_OTHER): Payer: Medicare Other | Admitting: Unknown Physician Specialty

## 2015-07-05 ENCOUNTER — Encounter: Payer: Self-pay | Admitting: Unknown Physician Specialty

## 2015-07-05 VITALS — BP 130/85 | HR 75 | Temp 97.8°F | Ht 63.7 in | Wt 191.8 lb

## 2015-07-05 DIAGNOSIS — L03012 Cellulitis of left finger: Secondary | ICD-10-CM | POA: Diagnosis not present

## 2015-07-05 MED ORDER — SULFAMETHOXAZOLE-TRIMETHOPRIM 800-160 MG PO TABS: 1.0000 | ORAL_TABLET | Freq: Two times a day (BID) | ORAL | Status: DC

## 2015-07-05 NOTE — Progress Notes (Signed)
   BP 130/85 mmHg  Pulse 75  Temp(Src) 97.8 F (36.6 C)  Ht 5' 3.7" (1.618 m)  Wt 191 lb 12.8 oz (87 kg)  BMI 33.23 kg/m2  SpO2 98%   Subjective:    Patient ID: Ryan [name], male    DOB: [birth year], 65 y.o.   MRN: [id number]  HPI: Ryan [name] is a 65 y.o. male  Chief Complaint  Patient presents with  . Hand Pain    pt states he has an infection in his left middle finger. States it started bothering him Saturday.    Left middle finger swelling and pain.  Does not remember a foreign body.  He thinks he got a splinter.  Pain for about 2 days  Relevant past medical, surgical, family and social history reviewed and updated as indicated. Interim medical history since our last visit reviewed. Allergies and medications reviewed and updated.  Review of Systems  Per HPI unless specifically indicated above     Objective:    BP 130/85 mmHg  Pulse 75  Temp(Src) 97.8 F (36.6 C)  Ht 5' 3.7" (1.618 m)  Wt 191 lb 12.8 oz (87 kg)  BMI 33.23 kg/m2  SpO2 98%  Wt Readings from Last 3 Encounters:  07/05/15 191 lb 12.8 oz (87 kg)  06/23/15 188 lb (85.276 kg)  06/20/15 190 lb 3.2 oz (86.274 kg)    Physical Exam  Constitutional: He is oriented to person, place, and time. He appears well-developed and well-nourished. No distress.  HENT:  Head: Normocephalic and atraumatic.  Eyes: Conjunctivae and lids are normal. Right eye exhibits no discharge. Left eye exhibits no discharge. No scleral icterus.  Cardiovascular: Normal rate.   Pulmonary/Chest: Effort normal.  Abdominal: Normal appearance. There is no splenomegaly or hepatomegaly.  Musculoskeletal: Normal range of motion.  Neurological: He is alert and oriented to person, place, and time.  Skin: Skin is intact. No rash noted. No pallor.  Left midddle finger blistering.  Drained moderate amount of discharge.  Area debrided.  Dr. Jeananne Rama supervised.  No FOBs noted on wound inspection  Psychiatric: He has a normal mood and  affect. His behavior is normal. Judgment and thought content normal.    Results for orders placed or performed during the hospital encounter of 06/23/15  TSH  Result Value Ref Range   TSH 2.544 0.350 - 4.500 uIU/mL      Assessment & Plan:   Problem List Items Addressed This Visit    None    Visit Diagnoses    Cellulitis of finger of left hand    -  Primary        Follow up plan: Return in about 2 days (around 07/07/2015).

## 2015-07-05 NOTE — Addendum Note (Signed)
Addended by: Kathrine Haddock on: 07/05/2015 01:45 PM   Modules accepted: Orders

## 2015-07-05 NOTE — Patient Instructions (Signed)
Soak and change bandage once a day.  Use antibiotic ointment.

## 2015-07-07 ENCOUNTER — Encounter: Payer: Self-pay | Admitting: Unknown Physician Specialty

## 2015-07-07 ENCOUNTER — Ambulatory Visit (INDEPENDENT_AMBULATORY_CARE_PROVIDER_SITE_OTHER): Payer: Medicare Other | Admitting: Unknown Physician Specialty

## 2015-07-07 ENCOUNTER — Telehealth: Payer: Self-pay

## 2015-07-07 VITALS — BP 115/75 | HR 83 | Temp 98.5°F | Ht 63.7 in | Wt 188.0 lb

## 2015-07-07 DIAGNOSIS — L03012 Cellulitis of left finger: Secondary | ICD-10-CM

## 2015-07-07 NOTE — Progress Notes (Signed)
   BP 115/75 mmHg  Pulse 83  Temp(Src) 98.5 F (36.9 C)  Ht 5' 3.7" (1.618 m)  Wt 188 lb (85.276 kg)  BMI 32.57 kg/m2  SpO2 95%   Subjective:    Patient ID: Ryan [name], male    DOB: 10/08/1950, 65 y.o.   MRN: [id number]  HPI: Ryan [name] is a 65 y.o. male  Chief Complaint  Patient presents with  . Cellulitis    pt states his finger is getting better   Cellulitis F/u of cellulitis of finger.  It is much better and without pain, erythema, or discharge.    Relevant past medical, surgical, family and social history reviewed and updated as indicated. Interim medical history since our last visit reviewed. Allergies and medications reviewed and updated.  Review of Systems  Per HPI unless specifically indicated above     Objective:    BP 115/75 mmHg  Pulse 83  Temp(Src) 98.5 F (36.9 C)  Ht 5' 3.7" (1.618 m)  Wt 188 lb (85.276 kg)  BMI 32.57 kg/m2  SpO2 95%  Wt Readings from Last 3 Encounters:  07/07/15 188 lb (85.276 kg)  07/05/15 191 lb 12.8 oz (87 kg)  06/23/15 188 lb (85.276 kg)    Physical Exam  Constitutional: He is oriented to person, place, and time. He appears well-developed and well-nourished. No distress.  HENT:  Head: Normocephalic and atraumatic.  Eyes: Conjunctivae and lids are normal. Right eye exhibits no discharge. Left eye exhibits no discharge. No scleral icterus.  Cardiovascular: Normal rate.   Pulmonary/Chest: Effort normal.  Abdominal: Normal appearance. There is no splenomegaly or hepatomegaly.  Neurological: He is alert and oriented to person, place, and time.  Skin: Skin is intact. No rash noted. No pallor.  Psychiatric: He has a normal mood and affect. His behavior is normal. Judgment and thought content normal.  Middle finger right hand without erythema,pain, or discharge.    Results for orders placed or performed in visit on 07/05/15  Wound culture  Result Value Ref Range   Gram Stain Result Final report    Result 1 Comment     RESULT 2 Comment    RESULT 3 Comment    Aerobic Bacterial Culture Preliminary report (A)    Result 1 Comment (A)       Assessment & Plan:   Problem List Items Addressed This Visit    None    Visit Diagnoses    Cellulitis of finger of left hand    -  Primary    improved.  Keep covered with antibiotic ointment and bandage until completely healed       Noted not up to date on TD.  Patient had left.  Will call back to come in for td.    Follow up plan: Return if symptoms worsen or fail to improve.

## 2015-07-07 NOTE — Telephone Encounter (Signed)
Tried to call patient but no answer and no voicemail set up. Will try to call again Monday.

## 2015-07-07 NOTE — Telephone Encounter (Signed)
-----   Message from Kathrine Haddock, NP sent at 07/07/2015  1:52 PM EST ----- Needs td

## 2015-07-08 LAB — WOUND CULTURE

## 2015-07-10 NOTE — Telephone Encounter (Signed)
Tried to call patient but there was no answer and no voicemail set up. Will try to call again later.

## 2015-07-11 NOTE — Telephone Encounter (Signed)
Tried to call patient but there was no answer and no voicemail set up. Will send patient a letter.

## 2015-07-31 ENCOUNTER — Telehealth: Payer: Self-pay

## 2015-07-31 DIAGNOSIS — B182 Chronic viral hepatitis C: Secondary | ICD-10-CM

## 2015-07-31 NOTE — Telephone Encounter (Signed)
Patient called and is wanting a prescription for hep c. He states he is tired of being tired all the time.

## 2015-07-31 NOTE — Telephone Encounter (Signed)
Tried to call patient again. There was no answer and no voicemail set up. Will try to call again tomorrow.

## 2015-07-31 NOTE — Telephone Encounter (Signed)
Please let him know that we don't treat hep C. I've put in some orders for him to come in and get blood drawn and a referral to GI. They treat it and should be able to help him.

## 2015-07-31 NOTE — Telephone Encounter (Signed)
Tried to call patient but there was no answer and no voicemail set up. Will try to call again later.

## 2015-08-02 ENCOUNTER — Other Ambulatory Visit: Payer: Self-pay | Admitting: Unknown Physician Specialty

## 2015-08-02 NOTE — Telephone Encounter (Signed)
Tried to call patient. There was no answer and no voicemail set up. Will try to call again later.

## 2015-08-02 NOTE — Telephone Encounter (Signed)
Patient returned call and stated that he has an appointment in [facility] 09/04/15 for GI. Patient stated he was not coming by here for labs because he was just here and has an appointment with GI. He stated he was not made of money. I told him that I would let the provider know.

## 2015-09-04 ENCOUNTER — Encounter: Payer: Self-pay | Admitting: Gastroenterology

## 2015-09-04 ENCOUNTER — Ambulatory Visit (INDEPENDENT_AMBULATORY_CARE_PROVIDER_SITE_OTHER): Payer: Medicare Other | Admitting: Gastroenterology

## 2015-09-04 VITALS — BP 106/71 | HR 72 | Temp 98.0°F | Ht 63.0 in | Wt 185.0 lb

## 2015-09-04 DIAGNOSIS — B182 Chronic viral hepatitis C: Secondary | ICD-10-CM | POA: Diagnosis not present
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
## 2015-09-05 LAB — HEPATIC FUNCTION PANEL
ALBUMIN: 4.6 g/dL (ref 3.6–4.8)
ALT: 26 IU/L (ref 0–44)
AST: 22 IU/L (ref 0–40)
Alkaline Phosphatase: 52 IU/L (ref 39–117)
BILIRUBIN TOTAL: 0.9 mg/dL (ref 0.0–1.2)
BILIRUBIN, DIRECT: 0.2 mg/dL (ref 0.00–0.40)
TOTAL PROTEIN: 7.5 g/dL (ref 6.0–8.5)

## 2015-09-05 LAB — ALPHA-1-ANTITRYPSIN: A1 ANTITRYPSIN: 144 mg/dL (ref 90–200)

## 2015-09-05 LAB — MITOCHONDRIAL ANTIBODIES: Mitochondrial Ab: 7.5 Units (ref 0.0–20.0)

## 2015-09-05 LAB — ANTI-SMOOTH MUSCLE ANTIBODY, IGG: SMOOTH MUSCLE AB: 15 U (ref 0–19)

## 2015-09-05 LAB — HEPATITIS B SURFACE ANTIGEN: HEP B S AG: NEGATIVE

## 2015-09-05 LAB — PROTIME-INR
INR: 1.1 (ref 0.8–1.2)
PROTHROMBIN TIME: 11 s (ref 9.1–12.0)

## 2015-09-05 LAB — HEPATITIS A ANTIBODY, TOTAL: Hep A Total Ab: NEGATIVE

## 2015-09-05 LAB — CERULOPLASMIN: Ceruloplasmin: 30.3 mg/dL (ref 16.0–31.0)

## 2015-09-05 LAB — HEPATITIS B SURFACE ANTIBODY,QUALITATIVE: Hep B Surface Ab, Qual: REACTIVE

## 2015-09-06 ENCOUNTER — Other Ambulatory Visit: Payer: Self-pay | Admitting: Family Medicine

## 2015-09-06 NOTE — Telephone Encounter (Signed)
Your patient 

## 2015-09-07 ENCOUNTER — Telehealth: Payer: Self-pay

## 2015-09-07 LAB — HEPATITIS C GENOTYPE

## 2015-09-07 NOTE — Telephone Encounter (Signed)
LVM for pt to return my call.

## 2015-09-07 NOTE — Telephone Encounter (Signed)
-----   Message from Lucilla Lame, MD sent at 09/06/2015  8:18 AM EDT ----- The patient does not need vaccinations for Hep B but he does need it for hep A. The HCV labs are not back yet but if positive he will need treatment.

## 2015-09-08 NOTE — Telephone Encounter (Signed)
Pt notified of results and the need for Hep A vaccine. Waiting on the rest of his labs and [place] to start treatment for Hep C.

## 2015-09-11 LAB — HCV RT-PCR, QUANT (NON-GRAPH)
HCV QUANT LOG: 6.633 {Log_copies}/mL
HCV QUANT: 4300000 {copies}/mL
HCV QUANTITATIVE BASELINE: 1720000 [IU]/mL
IU LOG10: 6.236 {Log_IU}/mL

## 2015-09-22 ENCOUNTER — Telehealth: Payer: Self-pay
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
## 2015-09-22 NOTE — Telephone Encounter (Signed)
-----   Message from Lucilla Lame, MD sent at 09/14/2015 11:54 AM EDT ----- The patient is to be started on treatment when all of his labs and ultrasounds are in.

## 2015-09-28 ENCOUNTER — Ambulatory Visit
Admission: RE | Admit: 2015-09-28 | Discharge: 2015-09-28 | Disposition: A | Discharge status: Home / Self Care | Payer: Medicare Other | Source: Ambulatory Visit | Attending: Gastroenterology | Admitting: Gastroenterology

## 2015-09-28 DIAGNOSIS — B182 Chronic viral hepatitis C: Secondary | ICD-10-CM | POA: Diagnosis not present

## 2015-09-28 DIAGNOSIS — R932 Abnormal findings on diagnostic imaging of liver and biliary tract: Secondary | ICD-10-CM | POA: Diagnosis not present

## 2015-09-28 DIAGNOSIS — K8020 Calculus of gallbladder without cholecystitis without obstruction: Secondary | ICD-10-CM | POA: Diagnosis not present

## 2015-09-28 DIAGNOSIS — B1920 Unspecified viral hepatitis C without hepatic coma: Secondary | ICD-10-CM | POA: Diagnosis not present

## 2015-10-03 ENCOUNTER — Telehealth: Payer: Self-pay

## 2015-10-03 NOTE — Telephone Encounter (Signed)
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 The patient should be set up to start medication for his hepatitis C.

## 2015-10-03 NOTE — Telephone Encounter (Signed)
Pt notified of results. Paperwork has been faxed to [employer] to get started on Hep C treatment. Waiting on approval.

## 2015-10-04 ENCOUNTER — Other Ambulatory Visit: Payer: Self-pay | Admitting: Unknown Physician Specialty

## 2015-10-09 ENCOUNTER — Telehealth: Payer: Self-pay | Admitting: Gastroenterology

## 2015-10-09 ENCOUNTER — Other Ambulatory Visit: Payer: Self-pay

## 2015-10-09 DIAGNOSIS — B182 Chronic viral hepatitis C: Secondary | ICD-10-CM

## 2015-10-09 MED ORDER — SOFOSBUVIR-VELPATASVIR 400-100 MG PO TABS: 1.0000 | ORAL_TABLET | Freq: Every day | ORAL | Status: DC

## 2015-10-09 NOTE — Telephone Encounter (Signed)
Patient would like to talk to you regarding a letter in got in the mail today.

## 2015-10-11 NOTE — Telephone Encounter (Signed)
Spoke with pt regarding the letter he received from [place]. He wanted to make sure I received the same approval letter as he.

## 2015-10-17 ENCOUNTER — Telehealth: Payer: Self-pay

## 2015-10-17 NOTE — Telephone Encounter (Signed)
Patient requested for you to give him a call in reference to his Hepatitis C treatment.

## 2015-10-17 NOTE — Telephone Encounter (Signed)
Advised pt to contact me tomorrow once his medication has been delivered to discuss.

## 2015-10-18 NOTE — Telephone Encounter (Signed)
Patient called back today but would not leave a reason

## 2015-10-24 ENCOUNTER — Telehealth: Payer: Self-pay | Admitting: Unknown Physician Specialty

## 2015-10-24 MED ORDER — METHOCARBAMOL 750 MG PO TABS: 750.0000 mg | ORAL_TABLET | Freq: Four times a day (QID) | ORAL | Status: DC

## 2015-10-24 NOTE — Telephone Encounter (Signed)
I believe back braces are OTC.  I will call him in a muscle relaxant

## 2015-10-24 NOTE — Telephone Encounter (Signed)
Pt called and stated that his back was hurting and he would like to have a brace to see if that would help. He would like a call back with suggestions on what to do to ease the pain.

## 2015-10-24 NOTE — Telephone Encounter (Signed)
Forwarded to CW 

## 2015-10-25 MED ORDER — METHOCARBAMOL 750 MG PO TABS: 750.0000 mg | ORAL_TABLET | Freq: Four times a day (QID) | ORAL | Status: DC

## 2015-10-25 NOTE — Telephone Encounter (Signed)
Pt returned call

## 2015-10-25 NOTE — Telephone Encounter (Signed)
Called and left patient a voicemail asking for him to please return my call.  

## 2015-10-25 NOTE — Telephone Encounter (Signed)
Pt called to check status of his request, pt stated RX should be sent to [facility] in [place]. Please resend RX to [facility]. Thanks.

## 2015-10-25 NOTE — Telephone Encounter (Signed)
OK.  The back brace is OTC

## 2015-10-25 NOTE — Telephone Encounter (Signed)
Called and left patient a voicemail. I let him know what Malachy Mood said about the back braces and that a muscle relaxant would be sent in for him. I asked for the patient to please call us if he had any questions or concerns. Cheryl-please resend medication to the [facility] in [place].

## 2015-10-26 ENCOUNTER — Telehealth: Payer: Self-pay

## 2015-10-26 MED ORDER — TIZANIDINE HCL 4 MG PO TABS: 4.0000 mg | ORAL_TABLET | Freq: Four times a day (QID) | ORAL | Status: DC | PRN

## 2015-10-26 NOTE — Telephone Encounter (Signed)
Pharmacy sent a fax stating that methocarbamol is not covered. They want [place] to change the medication to either meloxicam or tizanidine. Pharmacy is [facility] in [place].

## 2015-10-26 NOTE — Telephone Encounter (Signed)
Sent a new rx in

## 2015-11-09 ENCOUNTER — Other Ambulatory Visit: Payer: Self-pay | Admitting: Unknown Physician Specialty

## 2015-11-25 ENCOUNTER — Other Ambulatory Visit: Payer: Self-pay | Admitting: Unknown Physician Specialty

## 2015-12-07 ENCOUNTER — Telehealth: Payer: Self-pay | Admitting: Gastroenterology

## 2015-12-07 NOTE — Telephone Encounter (Signed)
On his third bottle of medication with 4 pills left, is he suppose to get more? Please call.

## 2015-12-07 NOTE — Telephone Encounter (Signed)
Pt is done with treatment after his 3rd bottle. Will you call him and schedule his follow up Hep C treatment completed appt? I can be at Dr. Dorothey Baseman next available. Thank you!

## 2015-12-25 ENCOUNTER — Other Ambulatory Visit: Payer: Self-pay

## 2015-12-25 DIAGNOSIS — B182 Chronic viral hepatitis C: Secondary | ICD-10-CM

## 2015-12-26 ENCOUNTER — Other Ambulatory Visit: Payer: Self-pay | Admitting: Unknown Physician Specialty

## 2015-12-27 DIAGNOSIS — B182 Chronic viral hepatitis C: Secondary | ICD-10-CM | POA: Diagnosis not present

## 2015-12-27 DIAGNOSIS — F250 Schizoaffective disorder, bipolar type: Secondary | ICD-10-CM | POA: Diagnosis not present

## 2015-12-28 ENCOUNTER — Other Ambulatory Visit: Payer: Self-pay

## 2015-12-28 LAB — HEPATIC FUNCTION PANEL
ALBUMIN: 4.4 g/dL (ref 3.6–4.8)
ALT: 64 IU/L — AB (ref 0–44)
AST: 38 IU/L (ref 0–40)
Alkaline Phosphatase: 38 IU/L — ABNORMAL LOW (ref 39–117)
BILIRUBIN TOTAL: 0.5 mg/dL (ref 0.0–1.2)
BILIRUBIN, DIRECT: 0.13 mg/dL (ref 0.00–0.40)
Total Protein: 7.1 g/dL (ref 6.0–8.5)

## 2015-12-28 MED ORDER — MELOXICAM 7.5 MG PO TABS: 15.0000 mg | ORAL_TABLET | Freq: Every day | ORAL | 3 refills | Status: DC

## 2016-01-01 ENCOUNTER — Telehealth: Payer: Self-pay | Admitting: Gastroenterology

## 2016-01-01 NOTE — Telephone Encounter (Signed)
Pt had questions regarding his recent labs. Advised these were not back yet.

## 2016-01-01 NOTE — Telephone Encounter (Signed)
Patient would like to talk to you

## 2016-01-08 LAB — HCV RT-PCR, QUANT (NON-GRAPH)

## 2016-01-08 NOTE — Telephone Encounter (Signed)
-----   Message from Lucilla Lame, MD sent at 01/07/2016 10:52 AM EDT ----- The patient know that the liver enzymes are elevated and I do not see the patient's viral load has coming back from the lab.

## 2016-01-08 NOTE — Telephone Encounter (Signed)
Pt has been notified of lab results. Advised him we are still waiting on his viral load and this will be discussed at his appt on 01/18/16.

## 2016-01-10 NOTE — Telephone Encounter (Signed)
-----   Message from Lucilla Lame, MD sent at 01/08/2016 12:42 PM EDT ----- Let the patient know that the viral level is negative but his elevated liver enzymes are still of concern. He should have his labs checked again in 3 months.

## 2016-01-10 NOTE — Telephone Encounter (Signed)
Pt aware results will be discussed at office visit on 01/18/16.

## 2016-01-12 ENCOUNTER — Emergency Department: Payer: Medicare Other

## 2016-01-12 ENCOUNTER — Emergency Department
Admission: EM | Admit: 2016-01-12 | Discharge: 2016-01-12 | Disposition: A | Discharge status: Home / Self Care | Payer: Medicare Other | Attending: Emergency Medicine | Admitting: Emergency Medicine

## 2016-01-12 DIAGNOSIS — N189 Chronic kidney disease, unspecified: Secondary | ICD-10-CM | POA: Diagnosis not present

## 2016-01-12 DIAGNOSIS — Y929 Unspecified place or not applicable: Secondary | ICD-10-CM | POA: Insufficient documentation

## 2016-01-12 DIAGNOSIS — Y999 Unspecified external cause status: Secondary | ICD-10-CM | POA: Insufficient documentation

## 2016-01-12 DIAGNOSIS — Z79899 Other long term (current) drug therapy: Secondary | ICD-10-CM | POA: Insufficient documentation

## 2016-01-12 DIAGNOSIS — Z87891 Personal history of nicotine dependence: Secondary | ICD-10-CM | POA: Diagnosis not present

## 2016-01-12 DIAGNOSIS — Y939 Activity, unspecified: Secondary | ICD-10-CM | POA: Diagnosis not present

## 2016-01-12 DIAGNOSIS — I129 Hypertensive chronic kidney disease with stage 1 through stage 4 chronic kidney disease, or unspecified chronic kidney disease: Secondary | ICD-10-CM | POA: Insufficient documentation

## 2016-01-12 DIAGNOSIS — Z7982 Long term (current) use of aspirin: Secondary | ICD-10-CM | POA: Insufficient documentation

## 2016-01-12 DIAGNOSIS — N5089 Other specified disorders of the male genital organs: Secondary | ICD-10-CM | POA: Diagnosis not present

## 2016-01-12 DIAGNOSIS — X58XXXA Exposure to other specified factors, initial encounter: Secondary | ICD-10-CM | POA: Diagnosis not present

## 2016-01-12 DIAGNOSIS — N448 Other noninflammatory disorders of the testis: Secondary | ICD-10-CM | POA: Diagnosis not present

## 2016-01-12 DIAGNOSIS — S3022XA Contusion of scrotum and testes, initial encounter: Secondary | ICD-10-CM | POA: Insufficient documentation

## 2016-01-12 DIAGNOSIS — N501 Vascular disorders of male genital organs: Secondary | ICD-10-CM | POA: Diagnosis not present

## 2016-01-12 LAB — CBC WITH DIFFERENTIAL/PLATELET
BASOS ABS: 0.1 10*3/uL (ref 0–0.1)
Basophils Relative: 1 %
EOS PCT: 2 %
Eosinophils Absolute: 0.2 10*3/uL (ref 0–0.7)
HCT: 36.9 % — ABNORMAL LOW (ref 40.0–52.0)
Hemoglobin: 13 g/dL (ref 13.0–18.0)
LYMPHS ABS: 2.5 10*3/uL (ref 1.0–3.6)
LYMPHS PCT: 21 %
MCH: 30.8 pg (ref 26.0–34.0)
MCHC: 35.2 g/dL (ref 32.0–36.0)
MCV: 87.4 fL (ref 80.0–100.0)
MONO ABS: 1.3 10*3/uL — AB (ref 0.2–1.0)
MONOS PCT: 11 %
Neutro Abs: 7.9 10*3/uL — ABNORMAL HIGH (ref 1.4–6.5)
Neutrophils Relative %: 65 %
PLATELETS: 290 10*3/uL (ref 150–440)
RBC: 4.23 MIL/uL — ABNORMAL LOW (ref 4.40–5.90)
RDW: 14 % (ref 11.5–14.5)
WBC: 12 10*3/uL — ABNORMAL HIGH (ref 3.8–10.6)

## 2016-01-12 LAB — LACTIC ACID, PLASMA: Lactic Acid, Venous: 1.9 mmol/L (ref 0.5–1.9)

## 2016-01-12 LAB — URINALYSIS COMPLETE WITH MICROSCOPIC (ARMC ONLY)
BACTERIA UA: NONE SEEN
BILIRUBIN URINE: NEGATIVE
Glucose, UA: 50 mg/dL — AB
Hgb urine dipstick: NEGATIVE
KETONES UR: NEGATIVE mg/dL
Leukocytes, UA: NEGATIVE
Nitrite: NEGATIVE
Protein, ur: NEGATIVE mg/dL
RBC / HPF: NONE SEEN RBC/hpf (ref 0–5)
SQUAMOUS EPITHELIAL / LPF: NONE SEEN
Specific Gravity, Urine: 1.019 (ref 1.005–1.030)
pH: 5 (ref 5.0–8.0)

## 2016-01-12 LAB — BASIC METABOLIC PANEL
Anion gap: 7 (ref 5–15)
BUN: 22 mg/dL — AB (ref 6–20)
CALCIUM: 8.9 mg/dL (ref 8.9–10.3)
CO2: 25 mmol/L (ref 22–32)
CREATININE: 0.95 mg/dL (ref 0.61–1.24)
Chloride: 108 mmol/L (ref 101–111)
GFR calc Af Amer: >60 mL/min (ref >60)
GLUCOSE: 106 mg/dL — AB (ref 65–99)
Potassium: 4 mmol/L (ref 3.5–5.1)
Sodium: 140 mmol/L (ref 135–145)

## 2016-01-12 LAB — PROTIME-INR
INR: 1.07
Prothrombin Time: 13.9 seconds (ref 11.4–15.2)

## 2016-01-12 NOTE — Consult Note (Signed)
I have been asked to see the patient by Dr. Lurline Hare, for evaluation and management of bilateral scrotal hematoma.  History of present illness: 65 year old schizophrenic male who presented today with scrotal swelling. The patient's story is that he was out mowing his lawn yesterday on a riding mower and over the next 12 hours developed scrotal pain and swelling. The patient is not on any anticoagulants aside from 81 mg of aspirin. He is a very unreliable historian. The patient has pain with motion, but in the emergency department his pain was controlled without any medications. Denies any fevers or chills. He denies any voiding symptoms.  Review of systems: A 12 point comprehensive review of systems was obtained and is negative unless otherwise stated in the history of present illness.  Patient Active Problem List   Diagnosis Date Noted  . 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 Hepatitis C 06/26/2015  . Undifferentiated schizophrenia ([place])   . Hypertension 01/25/2015  . Chronic pain 01/25/2015  . Hypertensive CKD (chronic kidney disease) 01/25/2015  . Peripheral neuropathy ([place]) 01/25/2015  . Carpal tunnel syndrome on left 01/25/2015  . Loss of feeling or sensation 12/12/2014  . 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 Schizophrenia ([place]) 04/25/2014  . 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 Hallucination, visual 04/24/2014  . 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 Disorder of shoulder 03/26/2012  . LBP (low back pain) 02/10/2012    No current facility-administered medications on file prior to encounter.    Current Outpatient Prescriptions on File Prior to Encounter  Medication Sig Dispense Refill  . aspirin 81 MG chewable tablet Chew 81 mg by mouth 2 (two) times daily.    [name] atenolol (TENORMIN) 25 MG tablet Take 25 mg by mouth daily.    [name] atenolol (TENORMIN) 25 MG tablet Take 1 tablet (25 mg  total) by mouth daily. 30 tablet 12  . meloxicam (MOBIC) 7.5 MG tablet Take 2 tablets (15 mg total) by mouth daily. 60 tablet 3  . methocarbamol (ROBAXIN) 750 MG tablet Take 1 tablet (750 mg total) by mouth 4 (four) times daily. 40 tablet 1  . Multiple Vitamin (MULTIVITAMIN) tablet Take 1 tablet by mouth daily.    [name] OLANZapine (ZYPREXA) 20 MG tablet Take 2 tablets (40 mg total) by mouth at bedtime. 60 tablet 0  . Sofosbuvir-Velpatasvir (EPCLUSA) 400-100 MG TABS Take 1 tablet by mouth daily. For 12 weeks 28 tablet 2  . sulfamethoxazole-trimethoprim (BACTRIM DS,SEPTRA DS) 800-160 MG tablet Take 1 tablet by mouth 2 (two) times daily. (Patient not taking: Reported on 09/04/2015) 6 tablet 1  . tiZANidine (ZANAFLEX) 4 MG tablet Take 1 tablet (4 mg total) by mouth every 6 (six) hours as needed for muscle spasms. 30 tablet 0    Past Medical History:  Diagnosis Date  . Chronic pain   . Hepatitis C   . Hypertension   . Peripheral neuropathy ([place])   . Schizophrenia [facility])     Past Surgical History:  Procedure Laterality Date  . ABDOMINAL SURGERY     spleen removed.   . ARM AMPUTATION Right   . ELBOW SURGERY    . JOINT REPLACEMENT Bilateral    knee  . SHOULDER FUSION    . SPLENECTOMY    . TOE SURGERY      Social History  Substance Use Topics  . Smoking status: Former Research scientist (life sciences)  . Smokeless tobacco: Former Systems developer  .  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     Family History  Problem Relation Age of Onset  . Cancer Mother     bladder  . Heart disease Mother   . Hyperlipidemia Mother   . Hypertension Mother   . Diabetes Father   . Heart disease Father     MI  . Hyperlipidemia Father   . Hypertension Father   . Alcohol abuse Father     PE: Vitals:   01/12/16 0350 01/12/16 0518 01/12/16 0530 01/12/16 0600  BP: 106/87 112/82 (!) 123/97 110/79  Pulse: 77 78 83 75  Resp: 18 (!) 24 18 18   Temp:      TempSrc:      SpO2: 94% 94% 94% 92%   Weight:      Height:       Patient appears to be in no acute distress  patient is alert and oriented x3 Atraumatic normocephalic head No cervical or supraclavicular lymphadenopathy appreciated No increased work of breathing, no audible wheezes/rhonchi Regular sinus rhythm/rate Abdomen is soft, nontender, nondistended, no CVA or suprapubic tenderness Ecchymotic and enlarged scrotum with the ecchymosis tracking up to the base of the penis. Lower extremities are symmetric without appreciable edema Grossly neurologically intact No identifiable skin lesions   Recent Labs  01/12/16 0517  WBC 12.0*  HGB 13.0  HCT 36.9*    Recent Labs  01/12/16 0517  NA 140  K 4.0  CL 108  CO2 25  GLUCOSE 106*  BUN 22*  CREATININE 0.95  CALCIUM 8.9    Recent Labs  01/12/16 0517  INR 1.07   No results for input(s): LABURIN in the last 72 hours. Results for orders placed or performed in visit on 07/05/15  Wound culture     Status: Abnormal   Collection Time: 07/05/15  1:45 PM  Result Value Ref Range Status   Gram Stain Result Final report  Final   Result 1 Comment  Final    Comment: Few white blood cells.   RESULT 2 Comment  Final    Comment: Moderate gram negative rods.   RESULT 3 Comment  Final    Comment: Few gram positive cocci   Aerobic Bacterial Culture Final report (A)  Final   Result 1 Comment (A)  Final    Comment: Enterobacter aerogenes Heavy growth    ANTIMICROBIAL SUSCEPTIBILITY Comment  Final    Comment:       ** S = Susceptible; I = Intermediate; R = Resistant **                    P = Positive; N = Negative             MICS are expressed in micrograms per mL    Antibiotic                 RSLT#1    RSLT#2    RSLT#3    RSLT#4 Amoxicillin/Clavulanic Acid    R Cefazolin                      R Cefepime                       S Ceftriaxone                    S Cefuroxime  R Ciprofloxacin                  S Ertapenem                       S Gentamicin                     S Imipenem                       I Levofloxacin                   S Piperacillin                   S Tetracycline                   S Tobramycin                     S Trimethoprim/Sulfa             S     Imaging: Scrotal ultrasound: I have independently reviewed the images demonstrating a heterogeneous fluid extratesticular consistent with hematoma.  Imp: The patient has bilateral hematomas, story doesn't necessarily match up with the amount of hematoma. Nonetheless, ultrasound confirmed hematoma with good flow to the testicles bilaterally. There are no other appreciable abnormalities on the ultrasound. His pain is reasonably well controlled with minimal medication.  Recommendations: Our plan at this point is to monitor this and treat him conservatively with ice and scrotal elevation. He should stop his aspirin for the next 7 days. The scrotal hematoma with take quite a while to resolve. We will plan to follow the patient up in clinic in 2-4 weeks. I will arrange this appointment.  Louis Meckel W

## 2016-01-12 NOTE — ED Triage Notes (Signed)
Pt to triage via EMS; reports mowing on riding mower today; noted scrotal swelling at about 430pm after showering; pt now with increased swelling and discoloration of scrotum; denies pain; st has not voided since; bladder scan indicates 278ml urine

## 2016-01-12 NOTE — ED Notes (Signed)
Pt given food and drink.

## 2016-01-12 NOTE — ED Notes (Signed)
Waiting on pt to void on his own before discharge.

## 2016-01-12 NOTE — ED Provider Notes (Signed)
[facility] Emergency Department Provider Note   ____________________________________________   First MD Initiated Contact with Patient 01/12/16 0503     (approximate)  I have reviewed the triage vital signs and the nursing notes.   HISTORY  Chief Complaint Groin Swelling    HPI Ryan [name] is a 65 y.o. male presents to the ED via EMS with a chief complaint of scrotal swelling. Patient has a history of schizophrenia, traumatic brain injury, self-inflicted nucleation of right eye, CKD, hepatitis C whoreports riding on a lawnmower yesterday afternoon for approximately 45 minutes. About 4:30 PM while showering, patient noted swelling to his scrotum. EMS was called to the house at that time but patient was not transported to the hospital. Patient reports increased swelling and now discoloration of his scrotum. EMS noted swelling was more pronounced compared to the earlier visit. Patient reports he has not urinated since 5pm but denies feeling the urge to urinate. Denies associated fever, chills, chest pain, shortness of breath, abdominal pain, testicular pain, penile pain, dysuria, hematuria. Nothing makes his symptoms better or worse.   Past Medical History:  Diagnosis Date  . Chronic pain   . Hepatitis C   . Hypertension   . Peripheral neuropathy ([place])   . Schizophrenia [facility])     Patient Active Problem List   Diagnosis Date Noted  . 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 Hepatitis C 06/26/2015  . Undifferentiated schizophrenia ([place])   . Hypertension 01/25/2015  . Chronic pain 01/25/2015  . Hypertensive CKD (chronic kidney disease) 01/25/2015  . Peripheral neuropathy ([place]) 01/25/2015  . Carpal tunnel syndrome on left 01/25/2015  . Loss of feeling or sensation 12/12/2014  . 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 Schizophrenia ([place]) 04/25/2014  . Traumatic brain injury ([place])  04/25/2014  . Hallucination, visual 04/24/2014  . 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 Disorder of shoulder 03/26/2012  . LBP (low back pain) 02/10/2012    Past Surgical History:  Procedure Laterality Date  . ABDOMINAL SURGERY     spleen removed.   . ARM AMPUTATION Right   . ELBOW SURGERY    . JOINT REPLACEMENT Bilateral    knee  . SHOULDER FUSION    . SPLENECTOMY    . TOE SURGERY      Prior to Admission medications   Medication Sig Start Date End Date Taking? Authorizing Provider  aspirin 81 MG chewable tablet Chew 81 mg by mouth 2 (two) times daily.    Historical Provider, MD  atenolol (TENORMIN) 25 MG tablet Take 25 mg by mouth daily.    Historical Provider, MD  atenolol (TENORMIN) 25 MG tablet Take 1 tablet (25 mg total) by mouth daily. 11/10/15   Kathrine Haddock, NP  meloxicam (MOBIC) 7.5 MG tablet Take 2 tablets (15 mg total) by mouth daily. 12/28/15   Megan P Johnson, DO  methocarbamol (ROBAXIN) 750 MG tablet Take 1 tablet (750 mg total) by mouth 4 (four) times daily. 10/25/15   Kathrine Haddock, NP  Multiple Vitamin (MULTIVITAMIN) tablet Take 1 tablet by mouth daily.    Historical Provider, MD  OLANZapine (ZYPREXA) 20 MG tablet Take 2 tablets (40 mg total) by mouth at bedtime. 06/28/15   Hildred Priest, MD  Sofosbuvir-Velpatasvir (EPCLUSA) 400-100 MG TABS Take 1 tablet by mouth daily. For 12 weeks 10/09/15   Lucilla Lame, MD  sulfamethoxazole-trimethoprim (BACTRIM DS,SEPTRA DS) 800-160 MG tablet Take  1 tablet by mouth 2 (two) times daily. Patient not taking: Reported on 09/04/2015 07/05/15   Kathrine Haddock, NP  tiZANidine (ZANAFLEX) 4 MG tablet Take 1 tablet (4 mg total) by mouth every 6 (six) hours as needed for muscle spasms. 10/26/15   Kathrine Haddock, NP    Allergies Review of patient's allergies indicates no known allergies.  Family History  Problem Relation Age of Onset  . Cancer Mother     bladder  . Heart disease Mother   . Hyperlipidemia Mother     . Hypertension Mother   . Diabetes Father   . Heart disease Father     MI  . Hyperlipidemia Father   . Hypertension Father   . Alcohol abuse Father     Social History Social History  Substance Use Topics  . Smoking status: Former Research scientist (life sciences)  . Smokeless tobacco: Former Systems developer  . 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     Review of Systems  Constitutional: No fever/chills. Eyes: No visual changes. ENT: No sore throat. Cardiovascular: Denies chest pain. Respiratory: Denies shortness of breath. Gastrointestinal: No abdominal pain.  No nausea, no vomiting.  No diarrhea.  No constipation. Genitourinary: Positive for scrotal swelling and discoloration. Negative for dysuria. Musculoskeletal: Negative for back pain. Skin: Negative for rash. Neurological: Negative for headaches, focal weakness or numbness.  10-point ROS otherwise negative.  ____________________________________________   PHYSICAL EXAM:  VITAL SIGNS: ED Triage Vitals  Enc Vitals Group     BP 01/12/16 0334 121/68     Pulse Rate 01/12/16 0334 82     Resp 01/12/16 0334 18     Temp 01/12/16 0334 98 F (36.7 C)     Temp Source 01/12/16 0334 Oral     SpO2 01/12/16 0334 95 %     Weight 01/12/16 0335 188 lb (85.3 kg)     Height 01/12/16 0335 5\' 5"  (1.651 m)     Head Circumference --      Peak Flow --      Pain Score --      Pain Loc --      Pain Edu? --      Excl. in [place]? --     Constitutional: Alert and oriented. Well appearing and in no acute distress. Eyes: Right glass eye. Conjunctivae are normal. PERRL. EOMI. Head: Atraumatic. Nose: No congestion/rhinnorhea. Mouth/Throat: Mucous membranes are moist.  Oropharynx non-erythematous. Neck: No stridor.   Cardiovascular: Normal rate, regular rhythm. Grossly normal heart sounds.  Good peripheral circulation. Respiratory: Normal respiratory effort.  No retractions. Lungs CTAB. Gastrointestinal: Soft and nontender. No  distention. No abdominal bruits. No CVA tenderness. Genitourinary: Circumcised male. No penile discharge. Gross swelling of scrotum with ecchymosis. There is no warmth or erythema. No pain to palpation. Difficult to appreciate cremaster reflexes secondary to significant scrotal swelling. Musculoskeletal: Right upper arm amputation above the elbow. No lower extremity tenderness nor edema.  No joint effusions. Neurologic:  Normal speech and language. No gross focal neurologic deficits are appreciated. No gait instability. Skin:  Skin is warm, dry and intact. No rash noted. Psychiatric: Mood and affect are normal. Speech and behavior are normal.  ____________________________________________   LABS (all labs ordered are listed, but only abnormal results are displayed)  Labs Reviewed  CBC WITH DIFFERENTIAL/PLATELET - Abnormal; Notable for the following:       Result Value   WBC 12.0 (*)    RBC 4.23 (*)  HCT 36.9 (*)    Neutro Abs 7.9 (*)    Monocytes Absolute 1.3 (*)    All other components within normal limits  BASIC METABOLIC PANEL - Abnormal; Notable for the following:    Glucose, Bld 106 (*)    BUN 22 (*)    All other components within normal limits  CULTURE, BLOOD (ROUTINE X 2)  CULTURE, BLOOD (ROUTINE X 2)  LACTIC ACID, PLASMA  PROTIME-INR  URINALYSIS COMPLETEWITH MICROSCOPIC ([facility] ONLY)  LACTIC ACID, PLASMA   ____________________________________________  EKG  None ____________________________________________  RADIOLOGY  US Scrotum interpreted per Dr. Marisue Humble: 1. Large amount of complex echogenic material within both scrotal  sacs is extratesticular. This is likely secondary to complex fluid,  favoring hematoma/ hematocele. The lack of skin thickening argues  against infected fluid. This does not have bowel signature, and no  bowel containing hernia seen sonographically or on prior CT.  2. Normal blood flow to both testicles without torsion.    ____________________________________________   PROCEDURES  Procedure(s) performed: None  Procedures  Critical Care performed: No  ____________________________________________   INITIAL IMPRESSION / ASSESSMENT AND PLAN / ED COURSE  Pertinent labs & imaging results that were available during my care of the patient were reviewed by me and considered in my medical decision making (see chart for details).  65 year old male with significant psychiatric history who presents with gross swelling and discoloration of his scrotum. Reports seeing a cobweb on his lawnmower and wonders if he was bitten by a spider. Scrotal swelling is approximately the size of a grapefruit with symmetrical ecchymosis. Differential diagnosis includes scrotal abscess, Fournier's gangrene, self-inflicted injury/hematoma secondary to psychiatric disease. Patient is resting comfortably, in no pain, afebrile. Will obtain screening lab work, urinalysis, blood cultures, lactate and testicular ultrasound. Bladder scan reveals only 200 mL and patient is resting comfortably. Unclear whether or not patient is truly retaining urine. Will reassess.  Clinical Course  Comment By Time  Discussed with urology Dr. Louis Meckel who will evaluate patient in the emergency department. Paulette Blanch, MD 08/25 [phone]  Patient was evaluated by urologist in the emergency department who advised the patient to stop his aspirin for the next 7 days. Jock strap was provided for scrotal elevation and patient was instructed to ice the affected area as much as possible. Urology will arrange follow-up in 2-4 weeks. Strict return precautions given. Patient verbalizes understanding and agrees with plan of care. Paulette Blanch, MD 08/25 [phone]  Patient will be discharged once he urinates. Paulette Blanch, MD 08/25 0701     ____________________________________________   FINAL CLINICAL IMPRESSION(S) / ED DIAGNOSES  Final diagnoses:  Scrotal edema  Scrotal hematoma       NEW MEDICATIONS STARTED DURING THIS VISIT:  New Prescriptions   No medications on file     Note:  This document was prepared using Dragon voice recognition software and may include unintentional dictation errors.    Paulette Blanch, MD 01/12/16 [phone]

## 2016-01-12 NOTE — ED Notes (Signed)
Pt refusing to urinate. MD notified. Will make sure pt voids before discharge.

## 2016-01-12 NOTE — Discharge Instructions (Signed)
You were seen by the urologist who has advised you to do the following:  1. Hold your aspirin for the next 7 days. 2. Wear the jockstrap provided to you to elevate your scrotum. 3. Apply ice pack to scrotum several times daily to help decrease swelling.  Return to the ER for worsening symptoms, severe pain, persistent vomiting, fever or other concerns.

## 2016-01-12 NOTE — ED Notes (Signed)
Pt voided with no difficulty.

## 2016-01-13 DIAGNOSIS — N189 Chronic kidney disease, unspecified: Secondary | ICD-10-CM | POA: Insufficient documentation

## 2016-01-13 DIAGNOSIS — S3022XD Contusion of scrotum and testes, subsequent encounter: Secondary | ICD-10-CM | POA: Insufficient documentation

## 2016-01-13 DIAGNOSIS — Z79899 Other long term (current) drug therapy: Secondary | ICD-10-CM | POA: Diagnosis not present

## 2016-01-13 DIAGNOSIS — Z87891 Personal history of nicotine dependence: Secondary | ICD-10-CM | POA: Diagnosis not present

## 2016-01-13 DIAGNOSIS — I129 Hypertensive chronic kidney disease with stage 1 through stage 4 chronic kidney disease, or unspecified chronic kidney disease: Secondary | ICD-10-CM | POA: Diagnosis not present

## 2016-01-13 DIAGNOSIS — W228XXD Striking against or struck by other objects, subsequent encounter: Secondary | ICD-10-CM | POA: Insufficient documentation

## 2016-01-13 DIAGNOSIS — Z7982 Long term (current) use of aspirin: Secondary | ICD-10-CM | POA: Insufficient documentation

## 2016-01-13 DIAGNOSIS — N448 Other noninflammatory disorders of the testis: Secondary | ICD-10-CM | POA: Diagnosis not present

## 2016-01-13 DIAGNOSIS — Z791 Long term (current) use of non-steroidal anti-inflammatories (NSAID): Secondary | ICD-10-CM | POA: Diagnosis not present

## 2016-01-14 ENCOUNTER — Emergency Department
Admission: EM | Admit: 2016-01-14 | Discharge: 2016-01-14 | Disposition: A | Discharge status: Home / Self Care | Payer: Medicare Other | Attending: Emergency Medicine | Admitting: Emergency Medicine

## 2016-01-14 DIAGNOSIS — S3022XD Contusion of scrotum and testes, subsequent encounter: Secondary | ICD-10-CM

## 2016-01-14 MED ORDER — SULFAMETHOXAZOLE-TRIMETHOPRIM 800-160 MG PO TABS: 1.0000 | ORAL_TABLET | Freq: Two times a day (BID) | ORAL | 0 refills | Status: DC

## 2016-01-14 NOTE — ED Provider Notes (Signed)
[facility] Emergency Department Provider Note  ____________________________________________  Time seen: Approximately 2:40 AM  I have reviewed the triage vital signs and the nursing notes.   HISTORY  Chief Complaint Groin Swelling    HPI Ryan [name] is a 65 y.o. male who complains of scrotal swelling for the past 3 or 4 days. He reports that he was riding his lawnmower over a ditch and the bouncing in the lawnmower seat caused injury to his scrotum. He was seen in the ED 2 days ago, ultrasound revealed a complex fluid in the scrotum consistent with hematoma. Patient reports that did not give him any medicine to decrease the swelling of his scrotum. He doesn't like the scrotal support that they gave him.  Patient has a history of self mutilating behavior, but denies that this is due to any self-inflicted injury. No SI HIor hallucinations. Comply with his medications including Zyprexa.     Past Medical History:  Diagnosis Date  . Chronic pain   . Hepatitis C   . Hypertension   . Peripheral neuropathy ([place])   . Schizophrenia [facility])      Patient Active Problem List   Diagnosis Date Noted  . 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 Hepatitis C 06/26/2015  . Undifferentiated schizophrenia ([place])   . Hypertension 01/25/2015  . Chronic pain 01/25/2015  . Hypertensive CKD (chronic kidney disease) 01/25/2015  . Peripheral neuropathy ([place]) 01/25/2015  . Carpal tunnel syndrome on left 01/25/2015  . Loss of feeling or sensation 12/12/2014  . 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 Schizophrenia ([place]) 04/25/2014  . Traumatic brain injury ([place]) 04/25/2014  . Hallucination, visual 04/24/2014  . 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 Disorder of shoulder 03/26/2012  . LBP (low back pain) 02/10/2012     Past Surgical History:  Procedure Laterality Date  . ABDOMINAL  SURGERY     spleen removed.   . ARM AMPUTATION Right   . ELBOW SURGERY    . JOINT REPLACEMENT Bilateral    knee  . SHOULDER FUSION    . SPLENECTOMY    . TOE SURGERY       Prior to Admission medications   Medication Sig Start Date End Date Taking? Authorizing Provider  aspirin 81 MG chewable tablet Chew 81 mg by mouth 2 (two) times daily.    Historical Provider, MD  atenolol (TENORMIN) 25 MG tablet Take 25 mg by mouth daily.    Historical Provider, MD  atenolol (TENORMIN) 25 MG tablet Take 1 tablet (25 mg total) by mouth daily. 11/10/15   Kathrine Haddock, NP  meloxicam (MOBIC) 7.5 MG tablet Take 2 tablets (15 mg total) by mouth daily. 12/28/15   Megan P Johnson, DO  methocarbamol (ROBAXIN) 750 MG tablet Take 1 tablet (750 mg total) by mouth 4 (four) times daily. 10/25/15   Kathrine Haddock, NP  Multiple Vitamin (MULTIVITAMIN) tablet Take 1 tablet by mouth daily.    Historical Provider, MD  OLANZapine (ZYPREXA) 20 MG tablet Take 2 tablets (40 mg total) by mouth at bedtime. 06/28/15   Hildred Priest, MD  Sofosbuvir-Velpatasvir (EPCLUSA) 400-100 MG TABS Take 1 tablet by mouth daily. For 12 weeks 10/09/15   Lucilla Lame, MD  sulfamethoxazole-trimethoprim (BACTRIM DS) 800-160 MG tablet Take 1 tablet by mouth 2 (two) times daily. 01/14/16   Carrie Mew, MD  tiZANidine (ZANAFLEX) 4 MG tablet Take 1 tablet (4 mg total)  by mouth every 6 (six) hours as needed for muscle spasms. 10/26/15   Kathrine Haddock, NP     Allergies Review of patient's allergies indicates no known allergies.   Family History  Problem Relation Age of Onset  . Cancer Mother     bladder  . Heart disease Mother   . Hyperlipidemia Mother   . Hypertension Mother   . Diabetes Father   . Heart disease Father     MI  . Hyperlipidemia Father   . Hypertension Father   . Alcohol abuse Father     Social History Social History  Substance Use Topics  . Smoking status: Former Research scientist (life sciences)  . Smokeless tobacco: Former Systems developer  .  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     Review of Systems  Constitutional:   No fever or chills.   Cardiovascular:   No chest pain. Respiratory:   No dyspnea or cough. Gastrointestinal:   Negative for abdominal pain, vomiting and diarrhea.  Genitourinary:   Negative for dysuria or difficulty urinating.Positive scrotal swelling 10 point ROS otherwise negative ____________________________________________   PHYSICAL EXAM:  VITAL SIGNS: ED Triage Vitals  Enc Vitals Group     BP 01/14/16 0022 117/77     Pulse Rate 01/14/16 0022 73     Resp 01/14/16 0022 18     Temp 01/14/16 0022 97.7 F (36.5 C)     Temp Source 01/14/16 0022 Oral     SpO2 01/14/16 0022 94 %     Weight 01/14/16 0022 188 lb (85.3 kg)     Height 01/14/16 0022 5\' 5"  (1.651 m)     Head Circumference --      Peak Flow --      Pain Score 01/14/16 0214 0     Pain Loc --      Pain Edu? --      Excl. in [place]? --     Vital signs reviewed, nursing assessments reviewed.   Constitutional:   Alert and oriented. Well appearing and in no distress. Eyes:   No scleral icterus. No conjunctival pallor. Juliette Mangle.   ENT   Head:   Normocephalic and atraumatic.     Mouth/Throat:   MMM, no pharyngeal erythema. No peritonsillar mass.    Neck:   No stridor. No SubQ emphysema. No meningismus. Hematological/Lymphatic/Immunilogical:   No cervical lymphadenopathy. Cardiovascular:   RRR. Symmetric bilateral radial and DP pulses.  No murmurs.  Respiratory:   Normal respiratory effort without tachypnea nor retractions. Breath sounds are clear and equal bilaterally. No wheezes/rales/rhonchi. Gastrointestinal:   Soft and nontender. Non distended. There is no CVA tenderness.  No rebound, rigidity, or guarding. Genitourinary:   Diffuse swelling of the scrotum and foreskin. There is diffuse ecchymosis over the genitalia. No ecchymosis or swelling of the thighs or perineum posteriorly. No  warmth or induration. No drainage. The glans penis is unremarkable and without drainage. Musculoskeletal:   Nontender with normal range of motion in all extremities. No joint effusions.  No lower extremity tenderness.  No edema. Neurologic:   Normal speech and language.  CN 2-10 normal. Motor grossly intact. No gross focal neurologic deficits are appreciated.  Skin:    Skin is warm, dry and intact. No rash noted.  No petechiae, purpura, or bullae.  ____________________________________________    LABS (pertinent positives/negatives) (all labs ordered are listed, but only abnormal results are displayed) Labs Reviewed - No data to display ____________________________________________  EKG    ____________________________________________    RADIOLOGY  Ultrasound from 2 days ago reveals complex fluid in the scrotum consistent with hematoma  ____________________________________________   PROCEDURES Procedures  ____________________________________________   INITIAL IMPRESSION / ASSESSMENT AND PLAN / ED COURSE  Pertinent labs & imaging results that were available during my care of the patient were reviewed by me and considered in my medical decision making (see chart for details).  Patient presents with scrotal pain for repeat visit to the ED. Ultrasound was performed 2 days ago, consistent with scrotal hematoma due to trauma. Currently no evidence of any infection. No crepitus, not consistent with Fournier's gangrene, patient is not diabetic and afebrile. Patient is calm and comfortable. Seems to just have a misunderstanding about what to expect with the evolution and progression of this hematoma. I advised him that it will likely take a few weeks for the swelling and bruising to resolve. Continue using scrotal support. Wear loose clothing such as sweatpants. Follow Up with urology.     Clinical Course   ____________________________________________   FINAL CLINICAL  IMPRESSION(S) / ED DIAGNOSES  Final diagnoses:  Traumatic scrotal hematoma, subsequent encounter       Portions of this note were generated with dragon dictation software. Dictation errors may occur despite best attempts at proofreading.    Carrie Mew, MD 01/14/16 [phone]

## 2016-01-14 NOTE — ED Notes (Signed)
Pt dressed in in PJs, nonskid socks and given graham crackers. Jammie Kalan, brother,  [phone], called to pick-up pt

## 2016-01-14 NOTE — ED Triage Notes (Addendum)
Pt was at ED earlier today for swelling in testicles now appears via EMS for swelling and testicles "all black".    Now appears with gross swelling and bruising to penis and scrotum.  Pt right eye is false. Right arm is amputated.  Pt is hyper-religous, and claims to be a woman (with a vagina), and reports to have removed his right eye with a spoon - to change the color.

## 2016-01-17 LAB — CULTURE, BLOOD (ROUTINE X 2)
CULTURE: NO GROWTH
CULTURE: NO GROWTH

## 2016-01-18 ENCOUNTER — Encounter: Payer: Self-pay | Admitting: Gastroenterology

## 2016-01-18 ENCOUNTER — Ambulatory Visit (INDEPENDENT_AMBULATORY_CARE_PROVIDER_SITE_OTHER): Payer: Medicare Other | Admitting: Gastroenterology

## 2016-01-18 VITALS — BP 131/85 | HR 80 | Temp 98.1°F | Ht 65.0 in | Wt 199.0 lb

## 2016-01-18 DIAGNOSIS — B1920 Unspecified viral hepatitis C without hepatic coma: Secondary | ICD-10-CM

## 2016-01-18 NOTE — Progress Notes (Signed)
   Primary Care Physician: Kathrine Haddock, NP  Primary Gastroenterologist:  Dr. Lucilla Lame  Chief Complaint  Patient presents with  . Hepatitis C    Treatment completed    HPI: Ryan [name] is a 65 y.o. male here for follow-up of his hepatitis C. The patient completed his treatment one month ago. The patient reports his only problem to be an injury to his testicle during a lawnmower  accident.  The patient's hepatitis C viral load was negative.  Current Outpatient Prescriptions  Medication Sig Dispense Refill  . aspirin 81 MG chewable tablet Chew 81 mg by mouth 2 (two) times daily.    [name] atenolol (TENORMIN) 25 MG tablet Take 25 mg by mouth daily.    [name] atenolol (TENORMIN) 25 MG tablet Take 1 tablet (25 mg total) by mouth daily. 30 tablet 12  . meloxicam (MOBIC) 7.5 MG tablet Take 2 tablets (15 mg total) by mouth daily. 60 tablet 3  . Multiple Vitamin (MULTIVITAMIN) tablet Take 1 tablet by mouth daily.    [name] OLANZapine (ZYPREXA) 20 MG tablet Take 2 tablets (40 mg total) by mouth at bedtime. 60 tablet 0  . sulfamethoxazole-trimethoprim (BACTRIM DS) 800-160 MG tablet Take 1 tablet by mouth 2 (two) times daily. 14 tablet 0  . tiZANidine (ZANAFLEX) 4 MG tablet Take 1 tablet (4 mg total) by mouth every 6 (six) hours as needed for muscle spasms. 30 tablet 0  . methocarbamol (ROBAXIN) 750 MG tablet Take 1 tablet (750 mg total) by mouth 4 (four) times daily. 40 tablet 1   No current facility-administered medications for this visit.     Allergies as of 01/18/2016  . (No Known Allergies)    ROS:  General: Negative for anorexia, weight loss, fever, chills, fatigue, weakness. ENT: Negative for hoarseness, difficulty swallowing , nasal congestion. CV: Negative for chest pain, angina, palpitations, dyspnea on exertion, peripheral edema.  Respiratory: Negative for dyspnea at rest, dyspnea on exertion, cough, sputum, wheezing.  GI: See history of present illness. GU:  Negative for dysuria,  hematuria, urinary incontinence, urinary frequency, nocturnal urination.  Endo: Negative for unusual weight change.    Physical Examination:   BP 131/85   Pulse 80   Temp 98.1 F (36.7 C) (Oral)   Ht 5\' 5"  (1.651 m)   Wt 199 lb (90.3 kg)   BMI 33.12 kg/m   General: Well-nourished, well-developed in no acute distress.  Eyes: No icterus. Conjunctivae pink. Extremities: No lower extremity edema. No clubbing or deformities. Right arm missing. Neuro: Alert and oriented x 3.  Grossly intact. Skin: Warm and dry, no jaundice.   Psych: Alert and cooperative, normal mood and affect.  Labs:    Imaging Studies:   Assessment and Plan:   Ryan [name] is a 65 y.o. y/o male A history of hepatitis C. He was genotype  2B . The patient has been treated and recently finished his treatment. The patient's liver enzymes are still elevated. He will have his labs checked again in 6 months.   Note: This dictation was prepared with Dragon dictation along with smaller phrase technology. Any transcriptional errors that result from this process are unintentional.

## 2016-02-06 ENCOUNTER — Ambulatory Visit: Payer: Self-pay | Admitting: Urology

## 2016-02-06 ENCOUNTER — Encounter: Payer: Self-pay | Admitting: Urology

## 2016-02-16 ENCOUNTER — Other Ambulatory Visit: Payer: Self-pay | Admitting: Family Medicine

## 2016-02-16 NOTE — Telephone Encounter (Signed)
Your patient 

## 2016-02-22 DIAGNOSIS — Z23 Encounter for immunization: Secondary | ICD-10-CM | POA: Diagnosis not present

## 2016-02-22 DIAGNOSIS — M19012 Primary osteoarthritis, left shoulder: Secondary | ICD-10-CM | POA: Diagnosis not present

## 2016-02-22 DIAGNOSIS — M1812 Unilateral primary osteoarthritis of first carpometacarpal joint, left hand: Secondary | ICD-10-CM | POA: Diagnosis not present

## 2016-02-27 ENCOUNTER — Encounter: Payer: Medicare Other | Admitting: Unknown Physician Specialty

## 2016-03-18 ENCOUNTER — Ambulatory Visit (INDEPENDENT_AMBULATORY_CARE_PROVIDER_SITE_OTHER): Payer: Medicare Other | Admitting: Urology

## 2016-03-18 ENCOUNTER — Encounter: Payer: Self-pay | Admitting: Urology

## 2016-03-18 VITALS — BP 115/75 | HR 72 | Ht 65.0 in | Wt 200.0 lb

## 2016-03-18 DIAGNOSIS — S3022XA Contusion of scrotum and testes, initial encounter: Secondary | ICD-10-CM

## 2016-03-18 DIAGNOSIS — N501 Vascular disorders of male genital organs: Secondary | ICD-10-CM | POA: Diagnosis not present

## 2016-03-18 NOTE — Progress Notes (Signed)
65 year old male seen today in follow-up from an ER visit where he was found to have a scrotal hematoma. This was presumed to be traumatic from the vibration of cutting his lawn on a riding mower. He is on no anticoagulation at that time. Over the past 2 months the patient has had quite a better resolution of his hematoma. However, he still has a small nodule in the posterior aspect of his left hemiscrotum. He denies any voiding symptoms, hematuria, or fever/chills. He has no significant ongoing pain.  Current Outpatient Prescriptions on File Prior to Visit  Medication Sig Dispense Refill  . aspirin 81 MG chewable tablet Chew 81 mg by mouth 2 (two) times daily.    [name] atenolol (TENORMIN) 25 MG tablet Take 1 tablet (25 mg total) by mouth daily. 30 tablet 12  . meloxicam (MOBIC) 7.5 MG tablet Take 2 tablets (15 mg total) by mouth daily. 60 tablet 3  . methocarbamol (ROBAXIN) 750 MG tablet Take 1 tablet (750 mg total) by mouth 4 (four) times daily. 40 tablet 1  . Multiple Vitamin (MULTIVITAMIN) tablet Take 1 tablet by mouth daily.    [name] OLANZapine (ZYPREXA) 20 MG tablet Take 2 tablets (40 mg total) by mouth at bedtime. 60 tablet 0   No current facility-administered medications on file prior to visit.    Past Medical History:  Diagnosis Date  . Chronic pain   . Hepatitis C   . Hypertension   . Peripheral neuropathy ([place])   . Schizophrenia [facility])    Past Surgical History:  Procedure Laterality Date  . ABDOMINAL SURGERY     spleen removed.   . ARM AMPUTATION Right   . ELBOW SURGERY    . JOINT REPLACEMENT Bilateral    knee  . SHOULDER FUSION    . SPLENECTOMY    . TOE SURGERY     Vitals:   03/18/16 1421  BP: 115/75  Pulse: 72   NAD Abdomen is soft 5cm left hemi-scrotal posterior nodule consistent with resolving hematoa  Imaging: No imaging obtained today.  Impression: The patient has a resolving left hemiscrotal hematoma. He still has some residual/organized hematoma that has not  resolved and is taking more time. However, things are improving and he is otherwise asymptomatic.  Recommendations: I recommended the patient return in 3 months to ensure resolution of the hematoma. If at that point, the patient's hematoma has not completely resolved or at least significantly shrunken we would consider an ultrasound.

## 2016-03-21 ENCOUNTER — Other Ambulatory Visit: Payer: Self-pay | Admitting: Unknown Physician Specialty

## 2016-03-21 NOTE — Telephone Encounter (Signed)
Pt called stated he needs a refill on his 60mg  stomach medication Tantoprazole. [facility] [place]. Thanks.

## 2016-04-19 ENCOUNTER — Other Ambulatory Visit: Payer: Self-pay | Admitting: Unknown Physician Specialty

## 2016-05-19 ENCOUNTER — Other Ambulatory Visit: Payer: Self-pay | Admitting: Unknown Physician Specialty

## 2016-06-18 ENCOUNTER — Other Ambulatory Visit: Payer: Self-pay | Admitting: Urology

## 2016-06-18 ENCOUNTER — Ambulatory Visit: Payer: Self-pay | Admitting: Urology

## 2016-06-19 DIAGNOSIS — F250 Schizoaffective disorder, bipolar type: Secondary | ICD-10-CM | POA: Diagnosis not present

## 2016-06-23 ENCOUNTER — Other Ambulatory Visit: Payer: Self-pay | Admitting: Unknown Physician Specialty

## 2016-06-24 ENCOUNTER — Ambulatory Visit
Admission: RE | Admit: 2016-06-24 | Discharge: 2016-06-24 | Disposition: A | Discharge status: Home / Self Care | Payer: Medicare Other | Source: Ambulatory Visit | Attending: Urology | Admitting: Urology

## 2016-06-24 DIAGNOSIS — N501 Vascular disorders of male genital organs: Secondary | ICD-10-CM | POA: Diagnosis not present

## 2016-06-24 DIAGNOSIS — S3022XA Contusion of scrotum and testes, initial encounter: Secondary | ICD-10-CM

## 2016-06-26 DIAGNOSIS — F250 Schizoaffective disorder, bipolar type: Secondary | ICD-10-CM | POA: Diagnosis not present

## 2016-07-08 ENCOUNTER — Ambulatory Visit: Payer: Self-pay

## 2016-07-15 ENCOUNTER — Ambulatory Visit: Payer: Self-pay

## 2016-07-16 ENCOUNTER — Encounter: Payer: Self-pay | Admitting: Urology

## 2016-07-16 ENCOUNTER — Ambulatory Visit (INDEPENDENT_AMBULATORY_CARE_PROVIDER_SITE_OTHER): Payer: Medicare Other | Admitting: Urology

## 2016-07-16 VITALS — BP 127/86 | HR 73 | Ht 65.0 in | Wt 215.8 lb

## 2016-07-16 DIAGNOSIS — N501 Vascular disorders of male genital organs: Secondary | ICD-10-CM

## 2016-07-16 DIAGNOSIS — S3022XA Contusion of scrotum and testes, initial encounter: Secondary | ICD-10-CM

## 2016-07-16 NOTE — Progress Notes (Signed)
66 year old male who was seen for follow-up of a scrotal hematoma that he developed after this fall. He was seen initially in the emergency department and discharged home with conservative management. He presents today for further evaluation with a scrotal ultrasound prior. The patient states that the hematoma has Z was off, but there still remains a small part of it that is hardened. The ultrasound confirms an organized hematoma, there is no blood flow to the area.  Denies any ongoing pain. He denies any bothersome symptoms associated with it.  Current Outpatient Prescriptions on File Prior to Visit  Medication Sig Dispense Refill  . aspirin 81 MG chewable tablet Chew 81 mg by mouth 2 (two) times daily.    [name] atenolol (TENORMIN) 25 MG tablet Take 1 tablet (25 mg total) by mouth daily. 30 tablet 12  . meloxicam (MOBIC) 7.5 MG tablet Take 2 tablets (15 mg total) by mouth daily. 60 tablet 3  . methocarbamol (ROBAXIN) 750 MG tablet Take 1 tablet (750 mg total) by mouth 4 (four) times daily. 40 tablet 1  . Multiple Vitamin (MULTIVITAMIN) tablet Take 1 tablet by mouth daily.    [name] OLANZapine (ZYPREXA) 20 MG tablet Take 2 tablets (40 mg total) by mouth at bedtime. 60 tablet 0  . pantoprazole (PROTONIX) 40 MG tablet TAKE 1 TABLET BY MOUTH EVERY DAY 30 tablet 0   No current facility-administered medications on file prior to visit.    Past Medical History:  Diagnosis Date  . Chronic pain   . Hepatitis C   . Hypertension   . Peripheral neuropathy ([place])   . Schizophrenia [facility])    Past Surgical History:  Procedure Laterality Date  . ABDOMINAL SURGERY     spleen removed.   . ARM AMPUTATION Right   . ELBOW SURGERY    . JOINT REPLACEMENT Bilateral    knee  . SHOULDER FUSION    . SPLENECTOMY    . TOE SURGERY     Vitals:   07/16/16 1022  BP: 127/86  Pulse: 73   NAD 4cm hardened hematoma behind left testicle - non-tender  U/S: I have independently reviewed the patient's ultrasound of his  scrotum which was performed on 06/24/16. This demonstrates an organized hematoma in the patient's left hemiscrotum.  Impression: The patient has residual organized hematoma in the left hemiscrotum, he is otherwise asymptomatic.  Recommendations: I recommended continued conservative management. The patient has no symptoms. I counseled him that this may never completely was all, but I would expect that it may continue to get smaller. We discussed alternative management strategies which would be removing it surgically. However, given he has no symptoms and is not currently bothered by a situation there is nothing more to do.  The patient will follow up with [place] when necessary.

## 2016-07-21 ENCOUNTER — Other Ambulatory Visit: Payer: Self-pay | Admitting: Unknown Physician Specialty

## 2016-09-06 ENCOUNTER — Encounter: Payer: Self-pay | Admitting: Unknown Physician Specialty

## 2016-09-06 ENCOUNTER — Ambulatory Visit (INDEPENDENT_AMBULATORY_CARE_PROVIDER_SITE_OTHER): Payer: Medicare Other | Admitting: Unknown Physician Specialty

## 2016-09-06 VITALS — BP 124/85 | HR 72 | Temp 97.5°F | Ht 64.7 in | Wt 214.4 lb

## 2016-09-06 DIAGNOSIS — I129 Hypertensive chronic kidney disease with stage 1 through stage 4 chronic kidney disease, or unspecified chronic kidney disease: Secondary | ICD-10-CM

## 2016-09-06 DIAGNOSIS — K219 Gastro-esophageal reflux disease without esophagitis: Secondary | ICD-10-CM | POA: Diagnosis not present

## 2016-09-06 DIAGNOSIS — F209 Schizophrenia, unspecified: Secondary | ICD-10-CM

## 2016-09-06 DIAGNOSIS — Z79899 Other long term (current) drug therapy: Secondary | ICD-10-CM | POA: Diagnosis not present

## 2016-09-06 DIAGNOSIS — Z0000 Encounter for general adult medical examination without abnormal findings: Secondary | ICD-10-CM

## 2016-09-06 DIAGNOSIS — Z7189 Other specified counseling: Secondary | ICD-10-CM

## 2016-09-06 NOTE — Assessment & Plan Note (Signed)
Sees psychiatry. ?

## 2016-09-06 NOTE — Assessment & Plan Note (Signed)
A voluntary discussion about advance care planning including the explanation and discussion of advance directives was extensively discussed  with the patient.  Explanation about the health care proxy and Living will was reviewed and packet with forms with explanation of how to fill them out was given.  During this discussion, the patient was able to identify a health care proxy as his brother and plans/does not plan to fill out the paperwork required.  Patient was offered a separate [place] visit for further assistance with forms.

## 2016-09-06 NOTE — Assessment & Plan Note (Signed)
No labs today as he forgot labs needed from [place].  Will come back up later

## 2016-09-06 NOTE — Assessment & Plan Note (Signed)
Stable, continue present medications.   

## 2016-09-06 NOTE — Progress Notes (Signed)
BP 124/85   Pulse 72   Temp 97.5 F (36.4 C)   Ht 5' 4.7" (1.643 m)   Wt 214 lb 6.4 oz (97.3 kg)   SpO2 97%   BMI 36.01 kg/m    Subjective:    Patient ID: Ryan [name], male    DOB: [birth year], 66 y.o.   MRN: [id number]  HPI: Ryan [name] is a 66 y.o. male  Chief Complaint  Patient presents with  . Medicare Wellness   Pt forgot the papers he needs from [place] where he is getting psychiatric medication.   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 No   Fall Risk  09/06/2016  Falls in the past year? No   Depression screen PHQ 2/9 09/06/2016  Decreased Interest 1  Down, Depressed, Hopeless 0  PHQ - 2 Score 1   GERD Gastric reflux is stable on present medications  Hypertension Using medications without difficulty Average home BPs not checking  No problems or lightheadedness No chest pain with exertion or shortness of breath No Edema  Gender identity Identifies as male.  Would like reassignment surgery but can't afford it.     Social History   Social History  . Marital status: Divorced    Spouse name: N/A  . Number of children: N/A  . Years of education: N/A   Occupational History  . Not on file.   Social History Main Topics  . Smoking status: Former Research scientist (life sciences)  . Smokeless tobacco: Former Systems developer  . 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   . Drug use: No  . Sexual activity: Not on file   Other Topics Concern  .  Not on file   Social History Narrative   Says lives at home by himself. Has a neighbor who checks on him. Active at baseline.   Family History  Problem Relation Age of Onset  . Cancer Mother     bladder  . Heart disease Mother   . Hyperlipidemia Mother   . Hypertension Mother   . Diabetes Father   . Heart disease Father     MI  . Hyperlipidemia Father   . Hypertension Father   . Alcohol abuse Father    Past Medical History:  Diagnosis Date  . Chronic pain   . Hepatitis C   . Hypertension   . Peripheral neuropathy   . Schizophrenia [facility])    Past Surgical History:  Procedure Laterality Date  . ABDOMINAL SURGERY     spleen removed.   . ARM AMPUTATION Right   . ELBOW SURGERY    . JOINT REPLACEMENT Bilateral    knee  . SHOULDER FUSION    . SPLENECTOMY    . TOE SURGERY      Relevant past medical, surgical, family and social history reviewed and updated as indicated. Interim medical history since our last visit reviewed. Allergies and medications reviewed and updated.  Review of Systems  Per HPI unless specifically indicated above  Objective:    BP 124/85   Pulse 72   Temp 97.5 F (36.4 C)   Ht 5' 4.7" (1.643 m)   Wt 214 lb 6.4 oz (97.3 kg)   SpO2 97%   BMI 36.01 kg/m   Wt Readings from Last 3 Encounters:  09/06/16 214 lb 6.4 oz (97.3 kg)  07/16/16 215 lb 12.8 oz (97.9 kg)  03/18/16 200 lb (90.7 kg)    Physical Exam  Constitutional: He is oriented to person, place, and time. He appears well-developed and well-nourished. No distress.  HENT:  Head: Normocephalic and atraumatic.  Eyes: Lids are normal. Right eye exhibits no discharge. Left eye exhibits no discharge. No scleral icterus.  Right eye prosthesis  Neck: Normal range of motion. Neck supple. No JVD present. Carotid bruit is not present.  Cardiovascular: Normal rate, regular rhythm and normal heart sounds.   Pulmonary/Chest: Effort normal and breath sounds normal. No respiratory distress.    Abdominal: Normal appearance. There is no splenomegaly or hepatomegaly.  Musculoskeletal: Normal range of motion.  Right arm amputation  Neurological: He is alert and oriented to person, place, and time.  Skin: Skin is warm, dry and intact. No rash noted. No pallor.  Psychiatric: He has a normal mood and affect. His behavior is normal. Judgment normal.      Assessment & Plan:   Problem List Items Addressed This Visit      Unprioritized   Advanced care planning/counseling discussion    A voluntary discussion about advance care planning including the explanation and discussion of advance directives was extensively discussed  with the patient.  Explanation about the health care proxy and Living will was reviewed and packet with forms with explanation of how to fill them out was given.  During this discussion, the patient was able to identify a health care proxy as his brother and plans/does not plan to fill out the paperwork required.  Patient was offered a separate [place] visit for further assistance with forms.         GERD (gastroesophageal reflux disease)    Stable, continue present medications.        Hypertensive CKD (chronic kidney disease)    Stable, continue present medications.        Relevant Orders   Comprehensive metabolic panel   Lipid Panel [place], Waived   Medication management    No labs today as he forgot labs needed from [place].  Will come back up later      Relevant Orders   Comprehensive metabolic panel   Schizophrenia [facility])    Sees psychiatry       Other Visit Diagnoses    Annual physical exam    -  Primary   Relevant Orders   HIV antibody       Follow up plan: Return in about 6 months (around 03/08/2017) for also needs CMP, lipid panel, and what is needed from [place].

## 2016-09-16 ENCOUNTER — Ambulatory Visit (INDEPENDENT_AMBULATORY_CARE_PROVIDER_SITE_OTHER): Payer: Medicare Other | Admitting: Unknown Physician Specialty

## 2016-09-16 ENCOUNTER — Encounter: Payer: Self-pay | Admitting: Unknown Physician Specialty

## 2016-09-16 VITALS — BP 133/85 | HR 63 | Temp 97.6°F | Wt 217.6 lb

## 2016-09-16 DIAGNOSIS — Z79899 Other long term (current) drug therapy: Secondary | ICD-10-CM

## 2016-09-16 DIAGNOSIS — F209 Schizophrenia, unspecified: Secondary | ICD-10-CM | POA: Diagnosis not present

## 2016-09-16 DIAGNOSIS — I129 Hypertensive chronic kidney disease with stage 1 through stage 4 chronic kidney disease, or unspecified chronic kidney disease: Secondary | ICD-10-CM | POA: Diagnosis not present

## 2016-09-16 DIAGNOSIS — R5382 Chronic fatigue, unspecified: Secondary | ICD-10-CM | POA: Diagnosis not present

## 2016-09-16 NOTE — Progress Notes (Signed)
   BP 133/85   Pulse 63   Temp 97.6 F (36.4 C)   Wt 217 lb 9.6 oz (98.7 kg)   SpO2 96%   BMI 36.55 kg/m    Subjective:    Patient ID: Ryan [name], male    DOB: [birth year], 66 y.o.   MRN: [id number]  HPI: Ryan [name] is a 66 y.o. male  Chief Complaint  Patient presents with  . Follow-up    pt has paperwork from [facility]   Fatigue Tired all the time.  Sleeping well.  States he sleeps through the night.  Needs to make himself do things.    Have labs requested through [place] for medication and disease monitoring  Relevant past medical, surgical, family and social history reviewed and updated as indicated. Interim medical history since our last visit reviewed. Allergies and medications reviewed and updated.  Review of Systems  Per HPI unless specifically indicated above     Objective:    BP 133/85   Pulse 63   Temp 97.6 F (36.4 C)   Wt 217 lb 9.6 oz (98.7 kg)   SpO2 96%   BMI 36.55 kg/m   Wt Readings from Last 3 Encounters:  09/16/16 217 lb 9.6 oz (98.7 kg)  09/06/16 214 lb 6.4 oz (97.3 kg)  07/16/16 215 lb 12.8 oz (97.9 kg)    Physical Exam  Constitutional: He is oriented to person, place, and time. He appears well-developed and well-nourished. No distress.  HENT:  Head: Normocephalic and atraumatic.  Eyes: Conjunctivae and lids are normal.  Cardiovascular: Normal rate.   Pulmonary/Chest: Effort normal.  Abdominal: Normal appearance. There is no splenomegaly or hepatomegaly.  Neurological: He is alert and oriented to person, place, and time.  Skin: Skin is intact. No rash noted. No pallor.  Psychiatric: He has a normal mood and affect. His behavior is normal. Judgment and thought content normal.       Assessment & Plan:   Problem List Items Addressed This Visit      Unprioritized   Hypertensive CKD (chronic kidney disease)   Relevant Orders   Lipid Panel w/o Chol/HDL Ratio   Comprehensive metabolic panel   Medication management   Relevant Orders   Comprehensive metabolic panel   CBC with Differential/Platelet   Schizophrenia ([place])   Relevant Orders   Thyroid Panel With TSH    Other Visit Diagnoses    Chronic fatigue    -  Primary   Relevant Orders   CBC with Differential/Platelet   Thyroid Panel With TSH      Diagnosis stable.  Consider sleep study.  F/U labs.    Follow up plan: Return in about 6 months (around 03/18/2017).

## 2016-09-17 ENCOUNTER — Encounter: Payer: Self-pay | Admitting: Unknown Physician Specialty

## 2016-09-17 LAB — CBC WITH DIFFERENTIAL/PLATELET
Basophils Absolute: 0 10*3/uL (ref 0.0–0.2)
Basos: 0 %
EOS (ABSOLUTE): 0.2 10*3/uL (ref 0.0–0.4)
EOS: 2 %
HEMATOCRIT: 42.7 % (ref 37.5–51.0)
HEMOGLOBIN: 14.4 g/dL (ref 13.0–17.7)
Immature Grans (Abs): 0 10*3/uL (ref 0.0–0.1)
Immature Granulocytes: 0 %
LYMPHS ABS: 1.8 10*3/uL (ref 0.7–3.1)
Lymphs: 16 %
MCH: 29.4 pg (ref 26.6–33.0)
MCHC: 33.7 g/dL (ref 31.5–35.7)
MCV: 87 fL (ref 79–97)
MONOCYTES: 12 %
MONOS ABS: 1.3 10*3/uL — AB (ref 0.1–0.9)
NEUTROS ABS: 7.7 10*3/uL — AB (ref 1.4–7.0)
Neutrophils: 70 %
Platelets: 289 10*3/uL (ref 150–379)
RBC: 4.9 x10E6/uL (ref 4.14–5.80)
RDW: 14.4 % (ref 12.3–15.4)
WBC: 11.1 10*3/uL — AB (ref 3.4–10.8)

## 2016-09-17 LAB — COMPREHENSIVE METABOLIC PANEL
ALBUMIN: 4.2 g/dL (ref 3.6–4.8)
ALK PHOS: 38 IU/L — AB (ref 39–117)
ALT: 52 IU/L — ABNORMAL HIGH (ref 0–44)
AST: 32 IU/L (ref 0–40)
Albumin/Globulin Ratio: 1.8 (ref 1.2–2.2)
BILIRUBIN TOTAL: 0.5 mg/dL (ref 0.0–1.2)
BUN / CREAT RATIO: 17 (ref 10–24)
BUN: 15 mg/dL (ref 8–27)
CHLORIDE: 103 mmol/L (ref 96–106)
CO2: 23 mmol/L (ref 18–29)
CREATININE: 0.87 mg/dL (ref 0.76–1.27)
Calcium: 9.8 mg/dL (ref 8.6–10.2)
GFR calc non Af Amer: 91 mL/min/{1.73_m2} (ref >59)
GFR, EST AFRICAN AMERICAN: 105 mL/min/{1.73_m2} (ref >59)
GLOBULIN, TOTAL: 2.3 g/dL (ref 1.5–4.5)
GLUCOSE: 102 mg/dL — AB (ref 65–99)
Potassium: 4.4 mmol/L (ref 3.5–5.2)
SODIUM: 141 mmol/L (ref 134–144)
TOTAL PROTEIN: 6.5 g/dL (ref 6.0–8.5)

## 2016-09-17 LAB — THYROID PANEL WITH TSH
Free Thyroxine Index: 1.4 (ref 1.2–4.9)
T3 UPTAKE RATIO: 28 % (ref 24–39)
T4 TOTAL: 5.1 ug/dL (ref 4.5–12.0)
TSH: 1.15 u[IU]/mL (ref 0.450–4.500)

## 2016-09-17 LAB — LIPID PANEL W/O CHOL/HDL RATIO
CHOLESTEROL TOTAL: 168 mg/dL (ref 100–199)
HDL: 30 mg/dL — AB (ref >39)
LDL Calculated: 104 mg/dL — ABNORMAL HIGH (ref 0–99)
TRIGLYCERIDES: 169 mg/dL — AB (ref 0–149)
VLDL Cholesterol Cal: 34 mg/dL (ref 5–40)

## 2016-11-28 ENCOUNTER — Other Ambulatory Visit: Payer: Self-pay | Admitting: Unknown Physician Specialty

## 2016-11-28 NOTE — Telephone Encounter (Signed)
[facility]ol   [phone]  Thanks

## 2016-11-28 NOTE — Telephone Encounter (Signed)
[facility] They state that the patient needs a new prescription/refill sent in for his atenolol.

## 2016-11-29 MED ORDER — ATENOLOL 25 MG PO TABS: 25.0000 mg | ORAL_TABLET | Freq: Every day | ORAL | 12 refills | Status: DC

## 2017-03-18 ENCOUNTER — Ambulatory Visit (INDEPENDENT_AMBULATORY_CARE_PROVIDER_SITE_OTHER): Payer: Medicare Other | Admitting: Unknown Physician Specialty

## 2017-03-18 ENCOUNTER — Encounter: Payer: Self-pay | Admitting: Unknown Physician Specialty

## 2017-03-18 VITALS — BP 121/80 | HR 71 | Temp 97.5°F | Ht 64.2 in | Wt 203.9 lb

## 2017-03-18 DIAGNOSIS — M549 Dorsalgia, unspecified: Secondary | ICD-10-CM | POA: Diagnosis not present

## 2017-03-18 DIAGNOSIS — R296 Repeated falls: Secondary | ICD-10-CM | POA: Diagnosis not present

## 2017-03-18 DIAGNOSIS — F203 Undifferentiated schizophrenia: Secondary | ICD-10-CM | POA: Diagnosis not present

## 2017-03-18 DIAGNOSIS — R4189 Other symptoms and signs involving cognitive functions and awareness: Secondary | ICD-10-CM | POA: Diagnosis not present

## 2017-03-18 DIAGNOSIS — R6889 Other general symptoms and signs: Secondary | ICD-10-CM

## 2017-03-18 NOTE — Progress Notes (Signed)
BP 121/80   Pulse 71   Temp (!) 97.5 F (36.4 C)   Ht 5' 4.2" (1.631 m)   Wt 203 lb 14.4 oz (92.5 kg)   SpO2 93%   BMI 34.78 kg/m    Subjective:    Patient ID: Ryan [name], male    DOB: Aug 12, 1950, 66 y.o.   MRN: [id number]  HPI: Ryan [name] is a 66 y.o. male  Chief Complaint  Patient presents with  . Hypertension  . Pain    pt states he has been having a lot of back pain due to having a lot of falls lately    Hypertension Using medications without difficulty Average home BPs Not checking   No problems or lightheadedness No chest pain with exertion or shortness of breath No Edema  Back pain Pt states he is falling multiple times.  States this is hurting his back.  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  States his "whole back" hurts.     Relevant past medical, surgical, family and social history reviewed and updated as indicated. Interim medical history since our last visit reviewed. Allergies and medications reviewed and updated.  Review of Systems  Per HPI unless specifically indicated above     Objective:    BP 121/80   Pulse 71   Temp (!) 97.5 F (36.4 C)   Ht 5' 4.2" (1.631 m)   Wt 203 lb 14.4 oz (92.5 kg)   SpO2 93%   BMI 34.78 kg/m   Wt Readings from Last 3 Encounters:  03/18/17 203 lb 14.4 oz (92.5 kg)  09/16/16 217 lb 9.6 oz (98.7 kg)  09/06/16 214 lb 6.4 oz (97.3 kg)    Physical Exam  Constitutional: He is oriented to person, place, and time. He appears well-developed and well-nourished. No distress.  HENT:  Head: Normocephalic and atraumatic.  Eyes: Conjunctivae and lids are normal. Right eye exhibits no discharge. Left eye exhibits no discharge. No scleral icterus.  Neck: Normal range of motion. Neck supple. No JVD present. Carotid bruit is not present.  Cardiovascular: Normal rate, regular rhythm and normal heart sounds.   Pulmonary/Chest: Effort normal and breath sounds normal. No respiratory distress.  Abdominal: Normal  appearance. There is no splenomegaly or hepatomegaly.  Neurological: He is alert and oriented to person, place, and time.  Skin: Skin is warm, dry and intact. No rash noted. No pallor.  Psychiatric: He has a normal mood and affect. His behavior is normal. Judgment and thought content normal.    Results for orders placed or performed in visit on 09/16/16  Lipid Panel w/o Chol/HDL Ratio  Result Value Ref Range   Cholesterol, Total 168 100 - 199 mg/dL   Triglycerides 169 (H) 0 - 149 mg/dL   HDL 30 (L) >39 mg/dL   VLDL Cholesterol Cal 34 5 - 40 mg/dL   LDL Calculated 104 (H) 0 - 99 mg/dL  Comprehensive metabolic panel  Result Value Ref Range   Glucose 102 (H) 65 - 99 mg/dL   BUN 15 8 - 27 mg/dL   Creatinine, Ser 0.87 0.76 - 1.27 mg/dL   GFR calc non Af Amer 91 >59 mL/min/1.73   GFR calc Af Amer 105 >59 mL/min/1.73   BUN/Creatinine Ratio 17 10 - 24   Sodium 141 134 - 144 mmol/L   Potassium 4.4 3.5 - 5.2 mmol/L   Chloride 103 96 - 106 mmol/L   CO2 23 18 - 29 mmol/L   Calcium 9.8  8.6 - 10.2 mg/dL   Total Protein 6.5 6.0 - 8.5 g/dL   Albumin 4.2 3.6 - 4.8 g/dL   Globulin, Total 2.3 1.5 - 4.5 g/dL   Albumin/Globulin Ratio 1.8 1.2 - 2.2   Bilirubin Total 0.5 0.0 - 1.2 mg/dL   Alkaline Phosphatase 38 (L) 39 - 117 IU/L   AST 32 0 - 40 IU/L   ALT 52 (H) 0 - 44 IU/L  CBC with Differential/Platelet  Result Value Ref Range   WBC 11.1 (H) 3.4 - 10.8 x10E3/uL   RBC 4.90 4.14 - 5.80 x10E6/uL   Hemoglobin 14.4 13.0 - 17.7 g/dL   Hematocrit 42.7 37.5 - 51.0 %   MCV 87 79 - 97 fL   MCH 29.4 26.6 - 33.0 pg   MCHC 33.7 31.5 - 35.7 g/dL   RDW 14.4 12.3 - 15.4 %   Platelets 289 150 - 379 x10E3/uL   Neutrophils 70 Not Estab. %   Lymphs 16 Not Estab. %   Monocytes 12 Not Estab. %   Eos 2 Not Estab. %   Basos 0 Not Estab. %   Neutrophils Absolute 7.7 (H) 1.4 - 7.0 x10E3/uL   Lymphocytes Absolute 1.8 0.7 - 3.1 x10E3/uL   Monocytes Absolute 1.3 (H) 0.1 - 0.9 x10E3/uL   EOS (ABSOLUTE) 0.2 0.0 -  0.4 x10E3/uL   Basophils Absolute 0.0 0.0 - 0.2 x10E3/uL   Immature Granulocytes 0 Not Estab. %   Immature Grans (Abs) 0.0 0.0 - 0.1 x10E3/uL  Thyroid Panel With TSH  Result Value Ref Range   TSH 1.150 0.450 - 4.500 uIU/mL   T4, Total 5.1 4.5 - 12.0 ug/dL   T3 Uptake Ratio 28 24 - 39 %   Free Thyroxine Index 1.4 1.2 - 4.9      Assessment & Plan:   Problem List Items Addressed This Visit      Unprioritized   Back pain    Pt states due to falls.  Will see about social worker f/u with this pt.  Taking Meloxicam and Robaxin.  I am unable to give him anything more sedating.        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  Refer to Connected care   Relevant Orders   Ambulatory referral to Connected [facility]       Follow up plan: Return in about 6 months (around 09/16/2017).

## 2017-03-18 NOTE — Assessment & Plan Note (Addendum)
Pt states due to falls.  Will see about social worker f/u with this pt.  Taking Meloxicam and Robaxin.  I am unable to give him anything more sedating.

## 2017-03-20 ENCOUNTER — Telehealth: Payer: Self-pay

## 2017-03-20 NOTE — Telephone Encounter (Signed)
Copied from [place] [id number]. Topic: Inquiry >> Mar 20, 2017 10:20 AM Corie Chiquito, NT wrote: Reason for CRM: Patient calling because he wanted to know if a therapist would be coming out to his home. Stated that he was told by his doctor that one would be coming and he just wanted to know when. Would like if someone would give him a call back please.   Attempted to reach patient to speak with him. Patient did not answer and his VM box was full so I could not leave a VM. Referral has been entered as a connected care referral which is a [facility] referral. Will route to Jill Alexanders who handles these referrals to follow up with the patient.

## 2017-03-27 ENCOUNTER — Encounter: Payer: Self-pay | Admitting: Unknown Physician Specialty

## 2017-03-31 ENCOUNTER — Ambulatory Visit: Payer: Self-pay

## 2017-03-31 ENCOUNTER — Inpatient Hospital Stay
Admission: EM | Admit: 2017-03-31 | Discharge: 2017-04-04 | DRG: 872 | Disposition: A | Discharge status: Home Health | Payer: Medicare Other | Attending: Internal Medicine | Admitting: Internal Medicine

## 2017-03-31 ENCOUNTER — Encounter: Payer: Self-pay | Admitting: Emergency Medicine

## 2017-03-31 ENCOUNTER — Emergency Department: Payer: Medicare Other

## 2017-03-31 ENCOUNTER — Other Ambulatory Visit: Payer: Self-pay

## 2017-03-31 DIAGNOSIS — Z7982 Long term (current) use of aspirin: Secondary | ICD-10-CM

## 2017-03-31 DIAGNOSIS — Z87891 Personal history of nicotine dependence: Secondary | ICD-10-CM

## 2017-03-31 DIAGNOSIS — R52 Pain, unspecified: Secondary | ICD-10-CM

## 2017-03-31 DIAGNOSIS — R21 Rash and other nonspecific skin eruption: Secondary | ICD-10-CM | POA: Diagnosis present

## 2017-03-31 DIAGNOSIS — I471 Supraventricular tachycardia: Secondary | ICD-10-CM | POA: Diagnosis present

## 2017-03-31 DIAGNOSIS — R6510 Systemic inflammatory response syndrome (SIRS) of non-infectious origin without acute organ dysfunction: Secondary | ICD-10-CM | POA: Diagnosis present

## 2017-03-31 DIAGNOSIS — G8929 Other chronic pain: Secondary | ICD-10-CM | POA: Diagnosis present

## 2017-03-31 DIAGNOSIS — R200 Anesthesia of skin: Secondary | ICD-10-CM | POA: Diagnosis present

## 2017-03-31 DIAGNOSIS — F209 Schizophrenia, unspecified: Secondary | ICD-10-CM | POA: Diagnosis present

## 2017-03-31 DIAGNOSIS — Z833 Family history of diabetes mellitus: Secondary | ICD-10-CM

## 2017-03-31 DIAGNOSIS — D72829 Elevated white blood cell count, unspecified: Secondary | ICD-10-CM

## 2017-03-31 DIAGNOSIS — E860 Dehydration: Secondary | ICD-10-CM | POA: Diagnosis present

## 2017-03-31 DIAGNOSIS — M545 Low back pain: Secondary | ICD-10-CM | POA: Diagnosis present

## 2017-03-31 DIAGNOSIS — K219 Gastro-esophageal reflux disease without esophagitis: Secondary | ICD-10-CM | POA: Diagnosis present

## 2017-03-31 DIAGNOSIS — A419 Sepsis, unspecified organism: Principal | ICD-10-CM | POA: Diagnosis present

## 2017-03-31 DIAGNOSIS — X58XXXA Exposure to other specified factors, initial encounter: Secondary | ICD-10-CM | POA: Diagnosis present

## 2017-03-31 DIAGNOSIS — R296 Repeated falls: Secondary | ICD-10-CM | POA: Diagnosis present

## 2017-03-31 DIAGNOSIS — Z8052 Family history of malignant neoplasm of bladder: Secondary | ICD-10-CM

## 2017-03-31 DIAGNOSIS — E871 Hypo-osmolality and hyponatremia: Secondary | ICD-10-CM | POA: Diagnosis present

## 2017-03-31 DIAGNOSIS — Z791 Long term (current) use of non-steroidal anti-inflammatories (NSAID): Secondary | ICD-10-CM

## 2017-03-31 DIAGNOSIS — R700 Elevated erythrocyte sedimentation rate: Secondary | ICD-10-CM

## 2017-03-31 DIAGNOSIS — R509 Fever, unspecified: Secondary | ICD-10-CM

## 2017-03-31 DIAGNOSIS — Z9081 Acquired absence of spleen: Secondary | ICD-10-CM

## 2017-03-31 DIAGNOSIS — B182 Chronic viral hepatitis C: Secondary | ICD-10-CM | POA: Diagnosis present

## 2017-03-31 DIAGNOSIS — S51002A Unspecified open wound of left elbow, initial encounter: Secondary | ICD-10-CM | POA: Diagnosis present

## 2017-03-31 DIAGNOSIS — Z89211 Acquired absence of right upper limb below elbow: Secondary | ICD-10-CM

## 2017-03-31 DIAGNOSIS — Z79899 Other long term (current) drug therapy: Secondary | ICD-10-CM

## 2017-03-31 DIAGNOSIS — I10 Essential (primary) hypertension: Secondary | ICD-10-CM | POA: Diagnosis present

## 2017-03-31 DIAGNOSIS — G629 Polyneuropathy, unspecified: Secondary | ICD-10-CM | POA: Diagnosis present

## 2017-03-31 DIAGNOSIS — Z811 Family history of alcohol abuse and dependence: Secondary | ICD-10-CM

## 2017-03-31 DIAGNOSIS — Z8249 Family history of ischemic heart disease and other diseases of the circulatory system: Secondary | ICD-10-CM

## 2017-03-31 DIAGNOSIS — L089 Local infection of the skin and subcutaneous tissue, unspecified: Secondary | ICD-10-CM | POA: Diagnosis present

## 2017-03-31 DIAGNOSIS — E876 Hypokalemia: Secondary | ICD-10-CM | POA: Diagnosis present

## 2017-03-31 DIAGNOSIS — Z8349 Family history of other endocrine, nutritional and metabolic diseases: Secondary | ICD-10-CM

## 2017-03-31 HISTORY — DX: Low back pain: M54.5

## 2017-03-31 HISTORY — DX: Unspecified fall, initial encounter: W19.XXXA

## 2017-03-31 HISTORY — DX: Complete traumatic amputation at level between elbow and wrist, right arm, initial encounter: S58.111A

## 2017-03-31 HISTORY — DX: Low back pain, unspecified: M54.50

## 2017-03-31 HISTORY — DX: Repeated falls: R29.6

## 2017-03-31 HISTORY — DX: Other symptoms and signs involving the musculoskeletal system: R29.898

## 2017-03-31 LAB — URINALYSIS, COMPLETE (UACMP) WITH MICROSCOPIC
Bacteria, UA: NONE SEEN
Bilirubin Urine: NEGATIVE
GLUCOSE, UA: 50 mg/dL — AB
HGB URINE DIPSTICK: NEGATIVE
KETONES UR: NEGATIVE mg/dL
Leukocytes, UA: NEGATIVE
Nitrite: NEGATIVE
PH: 5 (ref 5.0–8.0)
Protein, ur: NEGATIVE mg/dL
Specific Gravity, Urine: 1.018 (ref 1.005–1.030)

## 2017-03-31 LAB — DIFFERENTIAL
BASOS PCT: 1 %
Basophils Absolute: 0.2 10*3/uL — ABNORMAL HIGH (ref 0–0.1)
EOS PCT: 0 %
Eosinophils Absolute: 0 10*3/uL (ref 0–0.7)
Lymphocytes Relative: 8 %
Lymphs Abs: 1.4 10*3/uL (ref 1.0–3.6)
MONO ABS: 1.5 10*3/uL — AB (ref 0.2–1.0)
Monocytes Relative: 9 %
NEUTROS ABS: 13.3 10*3/uL — AB (ref 1.4–6.5)
Neutrophils Relative %: 82 %

## 2017-03-31 LAB — BASIC METABOLIC PANEL
ANION GAP: 13 (ref 5–15)
BUN: 12 mg/dL (ref 6–20)
CO2: 25 mmol/L (ref 22–32)
Calcium: 9 mg/dL (ref 8.9–10.3)
Chloride: 96 mmol/L — ABNORMAL LOW (ref 101–111)
Creatinine, Ser: 1.07 mg/dL (ref 0.61–1.24)
GFR calc Af Amer: >60 mL/min (ref >60)
GLUCOSE: 123 mg/dL — AB (ref 65–99)
POTASSIUM: 3.7 mmol/L (ref 3.5–5.1)
SODIUM: 134 mmol/L — AB (ref 135–145)

## 2017-03-31 LAB — CBC
HEMATOCRIT: 36.9 % — AB (ref 40.0–52.0)
Hemoglobin: 12.2 g/dL — ABNORMAL LOW (ref 13.0–18.0)
MCH: 28.1 pg (ref 26.0–34.0)
MCHC: 33.2 g/dL (ref 32.0–36.0)
MCV: 84.6 fL (ref 80.0–100.0)
Platelets: 562 10*3/uL — ABNORMAL HIGH (ref 150–440)
RBC: 4.36 MIL/uL — ABNORMAL LOW (ref 4.40–5.90)
RDW: 14.7 % — AB (ref 11.5–14.5)
WBC: 16.9 10*3/uL — ABNORMAL HIGH (ref 3.8–10.6)

## 2017-03-31 LAB — INFLUENZA PANEL BY PCR (TYPE A & B)
INFLAPCR: NEGATIVE
INFLBPCR: NEGATIVE

## 2017-03-31 LAB — PROTIME-INR
INR: 1.22
PROTHROMBIN TIME: 15.3 s — AB (ref 11.4–15.2)

## 2017-03-31 LAB — LACTIC ACID, PLASMA
Lactic Acid, Venous: 3.4 mmol/L (ref 0.5–1.9)
Lactic Acid, Venous: 2 mmol/L (ref 0.5–1.9)

## 2017-03-31 LAB — SEDIMENTATION RATE: Sed Rate: 86 mm/hr — ABNORMAL HIGH (ref 0–20)

## 2017-03-31 MED ORDER — DEXTROSE 5 % IV SOLN
2.0000 g | Freq: Once | INTRAVENOUS | Status: AC
  Administered 2017-03-31: 2 g via INTRAVENOUS
  Filled 2017-03-31: qty 2

## 2017-03-31 MED ORDER — ACETAMINOPHEN 325 MG PO TABS
650.0000 mg | ORAL_TABLET | Freq: Once | ORAL | Status: AC | PRN
  Administered 2017-03-31: 650 mg via ORAL
  Filled 2017-03-31: qty 2

## 2017-03-31 NOTE — ED Notes (Signed)
Pt unable to urinate at this time, given specimen cup and wheeled back out to the lobby.

## 2017-03-31 NOTE — Telephone Encounter (Signed)
I referred him to [facility] to get resources for him.  I he can't walk, he should go to the ER

## 2017-03-31 NOTE — ED Notes (Signed)
Caregiver inquiring about wait time; explained wait time. Pt family understanding.

## 2017-03-31 NOTE — ED Notes (Signed)
Spoke with Dr. Diannia Ruder regarding patient most recent VS. Orders received.

## 2017-03-31 NOTE — ED Triage Notes (Signed)
Pt c/o numbness to both feet for 8 years but worse over last 2 weeks.  Reports multiple falls because of this.  Also here for chronic back that has flared up this year. Denies loss bowel or bladder. Pain worse with movement.  No other complaints today.  Is disoriented to year but otherwise oriented.

## 2017-03-31 NOTE — ED Notes (Signed)
Up to bathroom with assistance.  Pt drinking ginger ale   Iv in place.  Pt alert.  Friend in with pt.

## 2017-03-31 NOTE — ED Notes (Signed)
Patient transported to MRI 

## 2017-03-31 NOTE — Telephone Encounter (Signed)
Patient currently at ER according to chart.
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
## 2017-03-31 NOTE — Telephone Encounter (Signed)
See Triage note; sent pt. To the ER.  Copied from [place]. Topic: Quick Communication - See Telephone Encounter >> Mar 31, 2017 11:11 AM Hewitt Shorts wrote: CRM for notification. See Telephone encounter for: pt called stating that he could barely walk and that his neck was hurting and that he could not put pressure on his feet Very hard to understand and he was not happy with his last visit he feels that no action was taken but needs to know what else can be done   Best number [phone]   03/31/17.
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
## 2017-04-01 ENCOUNTER — Other Ambulatory Visit: Payer: Self-pay

## 2017-04-01 ENCOUNTER — Encounter: Payer: Self-pay | Admitting: Internal Medicine

## 2017-04-01 DIAGNOSIS — Z811 Family history of alcohol abuse and dependence: Secondary | ICD-10-CM | POA: Diagnosis not present

## 2017-04-01 DIAGNOSIS — L089 Local infection of the skin and subcutaneous tissue, unspecified: Secondary | ICD-10-CM | POA: Diagnosis present

## 2017-04-01 DIAGNOSIS — R52 Pain, unspecified: Secondary | ICD-10-CM | POA: Diagnosis not present

## 2017-04-01 DIAGNOSIS — M545 Low back pain: Secondary | ICD-10-CM | POA: Diagnosis present

## 2017-04-01 DIAGNOSIS — R509 Fever, unspecified: Secondary | ICD-10-CM | POA: Diagnosis not present

## 2017-04-01 DIAGNOSIS — G8929 Other chronic pain: Secondary | ICD-10-CM | POA: Diagnosis present

## 2017-04-01 DIAGNOSIS — Z79899 Other long term (current) drug therapy: Secondary | ICD-10-CM | POA: Diagnosis not present

## 2017-04-01 DIAGNOSIS — E876 Hypokalemia: Secondary | ICD-10-CM | POA: Diagnosis present

## 2017-04-01 DIAGNOSIS — Z7982 Long term (current) use of aspirin: Secondary | ICD-10-CM | POA: Diagnosis not present

## 2017-04-01 DIAGNOSIS — G629 Polyneuropathy, unspecified: Secondary | ICD-10-CM | POA: Diagnosis present

## 2017-04-01 DIAGNOSIS — E871 Hypo-osmolality and hyponatremia: Secondary | ICD-10-CM | POA: Diagnosis not present

## 2017-04-01 DIAGNOSIS — Z8249 Family history of ischemic heart disease and other diseases of the circulatory system: Secondary | ICD-10-CM | POA: Diagnosis not present

## 2017-04-01 DIAGNOSIS — B182 Chronic viral hepatitis C: Secondary | ICD-10-CM | POA: Diagnosis present

## 2017-04-01 DIAGNOSIS — A419 Sepsis, unspecified organism: Secondary | ICD-10-CM | POA: Diagnosis not present

## 2017-04-01 DIAGNOSIS — Z8349 Family history of other endocrine, nutritional and metabolic diseases: Secondary | ICD-10-CM | POA: Diagnosis not present

## 2017-04-01 DIAGNOSIS — Z89211 Acquired absence of right upper limb below elbow: Secondary | ICD-10-CM | POA: Diagnosis not present

## 2017-04-01 DIAGNOSIS — R296 Repeated falls: Secondary | ICD-10-CM | POA: Diagnosis present

## 2017-04-01 DIAGNOSIS — X58XXXA Exposure to other specified factors, initial encounter: Secondary | ICD-10-CM | POA: Diagnosis present

## 2017-04-01 DIAGNOSIS — I10 Essential (primary) hypertension: Secondary | ICD-10-CM | POA: Diagnosis present

## 2017-04-01 DIAGNOSIS — R700 Elevated erythrocyte sedimentation rate: Secondary | ICD-10-CM | POA: Diagnosis present

## 2017-04-01 DIAGNOSIS — Z833 Family history of diabetes mellitus: Secondary | ICD-10-CM | POA: Diagnosis not present

## 2017-04-01 DIAGNOSIS — M549 Dorsalgia, unspecified: Secondary | ICD-10-CM | POA: Diagnosis not present

## 2017-04-01 DIAGNOSIS — F209 Schizophrenia, unspecified: Secondary | ICD-10-CM | POA: Diagnosis present

## 2017-04-01 DIAGNOSIS — Z9081 Acquired absence of spleen: Secondary | ICD-10-CM | POA: Diagnosis not present

## 2017-04-01 DIAGNOSIS — Z791 Long term (current) use of non-steroidal anti-inflammatories (NSAID): Secondary | ICD-10-CM | POA: Diagnosis not present

## 2017-04-01 DIAGNOSIS — R6510 Systemic inflammatory response syndrome (SIRS) of non-infectious origin without acute organ dysfunction: Secondary | ICD-10-CM | POA: Diagnosis not present

## 2017-04-01 DIAGNOSIS — I471 Supraventricular tachycardia: Secondary | ICD-10-CM | POA: Diagnosis not present

## 2017-04-01 DIAGNOSIS — E860 Dehydration: Secondary | ICD-10-CM | POA: Diagnosis not present

## 2017-04-01 DIAGNOSIS — Z8052 Family history of malignant neoplasm of bladder: Secondary | ICD-10-CM | POA: Diagnosis not present

## 2017-04-01 DIAGNOSIS — Z87891 Personal history of nicotine dependence: Secondary | ICD-10-CM | POA: Diagnosis not present

## 2017-04-01 LAB — CBC
HEMATOCRIT: 31.4 % — AB (ref 40.0–52.0)
Hemoglobin: 10.4 g/dL — ABNORMAL LOW (ref 13.0–18.0)
MCH: 27.9 pg (ref 26.0–34.0)
MCHC: 33.1 g/dL (ref 32.0–36.0)
MCV: 84.3 fL (ref 80.0–100.0)
PLATELETS: 471 10*3/uL — AB (ref 150–440)
RBC: 3.72 MIL/uL — ABNORMAL LOW (ref 4.40–5.90)
RDW: 14.7 % — AB (ref 11.5–14.5)
WBC: 13.5 10*3/uL — AB (ref 3.8–10.6)

## 2017-04-01 LAB — BASIC METABOLIC PANEL
Anion gap: 9 (ref 5–15)
BUN: 12 mg/dL (ref 6–20)
CHLORIDE: 98 mmol/L — AB (ref 101–111)
CO2: 25 mmol/L (ref 22–32)
CREATININE: 0.92 mg/dL (ref 0.61–1.24)
Calcium: 8.3 mg/dL — ABNORMAL LOW (ref 8.9–10.3)
GFR calc Af Amer: >60 mL/min (ref >60)
GFR calc non Af Amer: >60 mL/min (ref >60)
GLUCOSE: 177 mg/dL — AB (ref 65–99)
POTASSIUM: 3 mmol/L — AB (ref 3.5–5.1)
SODIUM: 132 mmol/L — AB (ref 135–145)

## 2017-04-01 LAB — GLUCOSE, CAPILLARY: Glucose-Capillary: 114 mg/dL — ABNORMAL HIGH (ref 65–99)

## 2017-04-01 LAB — VITAMIN B12: VITAMIN B 12: 834 pg/mL (ref 180–914)

## 2017-04-01 MED ORDER — HYDROCODONE-ACETAMINOPHEN 5-325 MG PO TABS
1.0000 | ORAL_TABLET | ORAL | Status: DC | PRN
  Administered 2017-04-01: 2 via ORAL
  Filled 2017-04-01: qty 2

## 2017-04-01 MED ORDER — ACETAMINOPHEN 650 MG RE SUPP
650.0000 mg | Freq: Four times a day (QID) | RECTAL | Status: DC | PRN
Start: 2017-04-01 — End: 2017-04-04

## 2017-04-01 MED ORDER — SODIUM CHLORIDE 0.9 % IV SOLN
INTRAVENOUS | Status: DC
  Administered 2017-04-01 – 2017-04-03 (×7): via INTRAVENOUS

## 2017-04-01 MED ORDER — ONDANSETRON HCL 4 MG PO TABS: 4.0000 mg | ORAL_TABLET | Freq: Four times a day (QID) | ORAL | Status: DC | PRN

## 2017-04-01 MED ORDER — ATENOLOL 25 MG PO TABS
25.0000 mg | ORAL_TABLET | Freq: Every day | ORAL | Status: DC
  Administered 2017-04-02: 25 mg via ORAL
  Filled 2017-04-01 (×4): qty 1

## 2017-04-01 MED ORDER — POTASSIUM CHLORIDE CRYS ER 20 MEQ PO TBCR
40.0000 meq | EXTENDED_RELEASE_TABLET | Freq: Once | ORAL | Status: AC
  Administered 2017-04-01: 40 meq via ORAL
  Filled 2017-04-01: qty 2

## 2017-04-01 MED ORDER — DEXTROSE 5 % IV SOLN
2.0000 g | INTRAVENOUS | Status: DC
  Administered 2017-04-01 – 2017-04-03 (×3): 2 g via INTRAVENOUS
  Filled 2017-04-01 (×4): qty 2

## 2017-04-01 MED ORDER — ADULT MULTIVITAMIN W/MINERALS CH
1.0000 | ORAL_TABLET | Freq: Every day | ORAL | Status: DC
  Administered 2017-04-01 – 2017-04-04 (×4): 1 via ORAL
  Filled 2017-04-01 (×5): qty 1

## 2017-04-01 MED ORDER — SENNOSIDES-DOCUSATE SODIUM 8.6-50 MG PO TABS: 1.0000 | ORAL_TABLET | Freq: Every evening | ORAL | Status: DC | PRN

## 2017-04-01 MED ORDER — ASPIRIN 81 MG PO CHEW
81.0000 mg | CHEWABLE_TABLET | Freq: Two times a day (BID) | ORAL | Status: DC
  Administered 2017-04-01 – 2017-04-04 (×7): 81 mg via ORAL
  Filled 2017-04-01 (×7): qty 1

## 2017-04-01 MED ORDER — ENOXAPARIN SODIUM 40 MG/0.4ML ~~LOC~~ SOLN
40.0000 mg | SUBCUTANEOUS | Status: DC
  Administered 2017-04-01 – 2017-04-03 (×3): 40 mg via SUBCUTANEOUS
  Filled 2017-04-01 (×3): qty 0.4

## 2017-04-01 MED ORDER — OLANZAPINE 10 MG PO TABS
40.0000 mg | ORAL_TABLET | Freq: Every day | ORAL | Status: DC
  Administered 2017-04-01 – 2017-04-03 (×3): 40 mg via ORAL
  Filled 2017-04-01 (×3): qty 4

## 2017-04-01 MED ORDER — ACETAMINOPHEN 325 MG PO TABS
650.0000 mg | ORAL_TABLET | Freq: Four times a day (QID) | ORAL | Status: DC | PRN
Start: 2017-04-01 — End: 2017-04-04
  Administered 2017-04-01: 650 mg via ORAL
  Filled 2017-04-01: qty 2

## 2017-04-01 MED ORDER — PANTOPRAZOLE SODIUM 40 MG PO TBEC
40.0000 mg | DELAYED_RELEASE_TABLET | Freq: Every day | ORAL | Status: DC
  Administered 2017-04-01 – 2017-04-04 (×4): 40 mg via ORAL
  Filled 2017-04-01 (×4): qty 1

## 2017-04-01 MED ORDER — ONDANSETRON HCL 4 MG/2ML IJ SOLN: 4.0000 mg | Freq: Four times a day (QID) | INTRAMUSCULAR | Status: DC | PRN

## 2017-04-01 NOTE — Progress Notes (Signed)
[facility] [place] NAME: Srijan Givan    MR#:  [id number]  DATE OF BIRTH:  April 16, 1951  SUBJECTIVE:  CHIEF COMPLAINT:   Chief Complaint  Patient presents with  . Numbness  Complains of lower extremity numbness on further asking he reports this been going on for 6-7 years REVIEW OF SYSTEMS:  Review of Systems  Constitutional: Negative for chills, fever and weight loss.  HENT: Negative for nosebleeds and sore throat.   Eyes: Negative for blurred vision.  Respiratory: Negative for cough, shortness of breath and wheezing.   Cardiovascular: Negative for chest pain, orthopnea, leg swelling and PND.  Gastrointestinal: Negative for abdominal pain, constipation, diarrhea, heartburn, nausea and vomiting.  Genitourinary: Negative for dysuria and urgency.  Musculoskeletal: Negative for back pain.  Skin: Negative for rash.  Neurological: Positive for sensory change. Negative for dizziness, speech change, focal weakness and headaches.  Endo/Heme/Allergies: Does not bruise/bleed easily.  Psychiatric/Behavioral: Negative for depression.    DRUG ALLERGIES:  No Known Allergies VITALS:  Blood pressure 113/67, pulse (!) 101, temperature 98.1 F (36.7 C), temperature source Oral, resp. rate 20, height 5\' 4"  (1.626 m), weight 98 kg (216 lb), SpO2 93 %. PHYSICAL EXAMINATION:  Physical Exam  Constitutional: He is oriented to person, place, and time and well-developed, well-nourished, and in no distress.  HENT:  Head: Normocephalic and atraumatic.  Eyes: Conjunctivae and EOM are normal. Pupils are equal, round, and reactive to light.  Neck: Normal range of motion. Neck supple. No tracheal deviation present. No thyromegaly present.  Cardiovascular: Normal rate, regular rhythm and normal heart sounds.  Pulmonary/Chest: Effort normal and breath sounds normal. No respiratory distress. He has no wheezes. He exhibits no tenderness.  Abdominal: Soft.  Bowel sounds are normal. He exhibits no distension. There is no tenderness.  Musculoskeletal: Normal range of motion.  Neurological: He is alert and oriented to person, place, and time. No cranial nerve deficit.  Skin: Skin is warm and dry. No rash noted.  Psychiatric: Mood and affect normal.   LABORATORY PANEL:  Male CBC Recent Labs  Lab 04/01/17 0438  WBC 13.5*  HGB 10.4*  HCT 31.4*  PLT 471*   ------------------------------------------------------------------------------------------------------------------ Chemistries  Recent Labs  Lab 04/01/17 0438  NA 132*  K 3.0*  CL 98*  CO2 25  GLUCOSE 177*  BUN 12  CREATININE 0.92  CALCIUM 8.3*   RADIOLOGY:  Dg Chest 2 View  Result Date: 03/31/2017 CLINICAL DATA:  66 year old male with history of chest tightness. EXAM: CHEST  2 VIEW COMPARISON:  Chest x-ray 06/23/2015. FINDINGS: The patient is rotated to the right on today's exam, resulting in distortion of the mediastinal contours and reduced diagnostic sensitivity and specificity for mediastinal pathology. Lung volumes are normal. Mild blunting of the right costophrenic sulcus which could reflect a trace right pleural effusion or chronic right pleuroparenchymal scarring. No left pleural effusion. No evidence of pulmonary edema. Heart size is upper limits of normal. Right arm amputation above the elbow. IMPRESSION: 1. Possible trace right pleural effusion versus chronic right pleuroparenchymal scarring in the base of the right hemithorax. 2. No other findings to suggest acute cardiopulmonary disease. Electronically Signed   By: Vinnie Langton M.D.   On: 03/31/2017 18:50   Mr Thoracic Spine Wo Contrast  Result Date: 03/31/2017 CLINICAL DATA:  Bilateral foot numbness. Multiple falls. Chronic back pain. EXAM: MRI THORACIC AND LUMBAR SPINE WITHOUT CONTRAST TECHNIQUE: Multiplanar and  multiecho pulse sequences of the thoracic and lumbar spine were obtained without intravenous contrast.  COMPARISON:  None. FINDINGS: MRI THORACIC SPINE FINDINGS Alignment:  Mild right convex curvature. Vertebrae: Discogenic endplate signal changes at T9-T10. Hemangioma in the posterior T5 vertebral body. No acute fracture. Cord:  Normal signal and morphology. Paraspinal and other soft tissues: Negative. Disc levels: And T9-T10, there is a small diffuse disc bulge without spinal canal stenosis. At T10-T11, there is a right subarticular protrusion without spinal canal stenosis. There is no spinal canal stenosis at any level. MRI LUMBAR SPINE FINDINGS Segmentation:  Standard. Alignment:  Grade 1 anterolisthesis at L5-S1. Vertebrae: Compression deformity of the L1 vertebral body with approximately 50% height loss anteriorly. No edema. Conus medullaris: Extends to the L2 level and appears normal. Paraspinal and other soft tissues: Negative. Disc levels: T12-L1: Small disc bulge without spinal canal stenosis. Mild left neural foraminal stenosis, partially due to retropulsion of the superior corner of L1. L1-L2: Small disc bulge and mild facet hypertrophy without spinal canal stenosis. Moderate bilateral neural foraminal stenosis. L2-L3: Small disc bulge. No spinal canal or neural foraminal stenosis. L3-L4: Small disc bulge and mild facet hypertrophy without spinal canal stenosis or neural foraminal stenosis. L4-L5: Disc desiccation with left eccentric bulge. Mild bilateral facet hypertrophy. Moderate left neural foraminal stenosis. No spinal canal stenosis. L5-S1: Grade 1 anterolisthesis secondary to left-greater-than-right facet hypertrophy. Pseudo disc bulge with mild-to-moderate bilateral neural foraminal stenosis but no spinal canal stenosis. Visualized sacrum: Normal. IMPRESSION: MR THORACIC SPINE IMPRESSION Mild lower thoracic degenerative disc disease without spinal canal or neural foraminal stenosis. MR LUMBAR SPINE IMPRESSION Moderate diffuse lumbar degenerative disc disease and facet arthrosis with  mild-to-moderate neural foraminal stenosis at multiple levels. No lumbar spinal canal stenosis. Electronically Signed   By: Ulyses Jarred M.D.   On: 03/31/2017 22:51   Mr Lumbar Spine Wo Contrast  Result Date: 03/31/2017 CLINICAL DATA:  Bilateral foot numbness. Multiple falls. Chronic back pain. EXAM: MRI THORACIC AND LUMBAR SPINE WITHOUT CONTRAST TECHNIQUE: Multiplanar and multiecho pulse sequences of the thoracic and lumbar spine were obtained without intravenous contrast. COMPARISON:  None. FINDINGS: MRI THORACIC SPINE FINDINGS Alignment:  Mild right convex curvature. Vertebrae: Discogenic endplate signal changes at T9-T10. Hemangioma in the posterior T5 vertebral body. No acute fracture. Cord:  Normal signal and morphology. Paraspinal and other soft tissues: Negative. Disc levels: And T9-T10, there is a small diffuse disc bulge without spinal canal stenosis. At T10-T11, there is a right subarticular protrusion without spinal canal stenosis. There is no spinal canal stenosis at any level. MRI LUMBAR SPINE FINDINGS Segmentation:  Standard. Alignment:  Grade 1 anterolisthesis at L5-S1. Vertebrae: Compression deformity of the L1 vertebral body with approximately 50% height loss anteriorly. No edema. Conus medullaris: Extends to the L2 level and appears normal. Paraspinal and other soft tissues: Negative. Disc levels: T12-L1: Small disc bulge without spinal canal stenosis. Mild left neural foraminal stenosis, partially due to retropulsion of the superior corner of L1. L1-L2: Small disc bulge and mild facet hypertrophy without spinal canal stenosis. Moderate bilateral neural foraminal stenosis. L2-L3: Small disc bulge. No spinal canal or neural foraminal stenosis. L3-L4: Small disc bulge and mild facet hypertrophy without spinal canal stenosis or neural foraminal stenosis. L4-L5: Disc desiccation with left eccentric bulge. Mild bilateral facet hypertrophy. Moderate left neural foraminal stenosis. No spinal canal  stenosis. L5-S1: Grade 1 anterolisthesis secondary to left-greater-than-right facet hypertrophy. Pseudo disc bulge with mild-to-moderate bilateral neural foraminal stenosis but no spinal canal stenosis. Visualized  sacrum: Normal. IMPRESSION: MR THORACIC SPINE IMPRESSION Mild lower thoracic degenerative disc disease without spinal canal or neural foraminal stenosis. MR LUMBAR SPINE IMPRESSION Moderate diffuse lumbar degenerative disc disease and facet arthrosis with mild-to-moderate neural foraminal stenosis at multiple levels. No lumbar spinal canal stenosis. Electronically Signed   By: Ulyses Jarred M.D.   On: 03/31/2017 22:51   ASSESSMENT AND PLAN:  66 year old male patient with history of hepatitis C, chronic pain, hypertension, schizophrenia, peripheral neuropathy admitted with fever and low back pain.  1. Systemic inflammatory response syndrome: Present on admission, seem to have resolved now no obvious source of infection 2. Back pain/numbness in the lower extremities: Seem to be chronic, monitor, outpatient orthopedic evaluation 3. Hyponatremia/hypokalemia: Replete and recheck 4. Dehydration: IV hydration and monitor 5 hepatitis C: Chronic    Physical therapy evaluation   All the records are reviewed and case discussed with Care Management/Social Worker. Management plans discussed with the patient, nursing and they are in agreement.  CODE STATUS: Full Code  TOTAL TIME TAKING CARE OF THIS PATIENT: 25 minutes.   More than 50% of the time was spent in counseling/coordination of care: YES  POSSIBLE D/C IN 1 DAYS, DEPENDING ON CLINICAL CONDITION.   Max Sane M.D on 04/01/2017 at 4:59 PM  Between 7am to 6pm - Pager - [phone]  After 6pm go to [email or web address] - Proofreader  [facility]  Office  [phone]  CC: Primary care physician; Kathrine Haddock, NP  Note: This dictation was prepared with Dragon dictation along with smaller phrase  technology. Any transcriptional errors that result from this process are unintentional.

## 2017-04-01 NOTE — ED Notes (Signed)
Report called to jeanette rn floor nurse

## 2017-04-01 NOTE — Progress Notes (Signed)
Pharmacy Antibiotic Note  Ryan [name] is a 66 y.o. male admitted on 03/31/2017 with bacteremia.  Pharmacy has been consulted for ceftriaxone dosing.  Plan: Ceftriaxone 2 grams q 24 hours ordered.  Height: 5\' 4"  (162.6 cm) Weight: 216 lb (98 kg) IBW/kg (Calculated) : 59.2  Temp (24hrs), Avg:99.3 F (37.4 C), Min:98.1 F (36.7 C), Max:101.6 F (38.7 C)  Recent Labs  Lab 03/31/17 1307 03/31/17 1828 03/31/17 2120  WBC 16.9*  --   --   CREATININE 1.07  --   --   LATICACIDVEN  --  3.4* 2.0*    Estimated Creatinine Clearance: 73.7 mL/min (by C-G formula based on SCr of 1.07 mg/dL).    No Known Allergies  Antimicrobials this admission: Ceftriaxone 11/12  >>    >>   Dose adjustments this admission:   Microbiology results: 11.12 BCx: pending      11/12 UA: (-)  Thank you for allowing pharmacy to be a part of this patient's care.  Sheza Strickland S 04/01/2017 1:47 AM

## 2017-04-01 NOTE — ED Notes (Signed)
Pt accidentally pulled iv out while sleeping.  Iv restarted.

## 2017-04-01 NOTE — H&P (Signed)
Ryan [name] NAME: Ryan [name]    MR#:  [id number]  DATE OF BIRTH:  03/05/1951  DATE OF ADMISSION:  03/31/2017  PRIMARY CARE PHYSICIAN: Kathrine Haddock, NP   REQUESTING/REFERRING PHYSICIAN:   CHIEF COMPLAINT:   Chief Complaint  Patient presents with  . Numbness    HISTORY OF PRESENT ILLNESS: Ryan [name]  is a 66 y.o. male with a known history of hepatitis C, hypertension, peripheral neuropathy, schizophrenia presented to the emergency room with fever of 1 day duration. Patient also had some numbness in the lower extremities. No weakness in any part of the body. No complaints of any slurred speech. No recent travel or any sick contacts at home. He was evaluated in the emergency room his lactic acid level was elevated. Patient was started on IV fluids and IV antibiotics. Patient also had some pain in the lower back in the thoracic and lumbar area. No bowel or bladder incontinence. Patient was worked up with MRI spine did not show any abscess. Hospitalist service was consulted for further care. Pain in the back is aching in nature 4 out of 10 on a scale of 1-10.  PAST MEDICAL HISTORY:   Past Medical History:  Diagnosis Date  . Chronic pain   . Hepatitis C   . Hypertension   . Peripheral neuropathy   . Schizophrenia ([place])     PAST SURGICAL HISTORY:  Past Surgical History:  Procedure Laterality Date  . ABDOMINAL SURGERY     spleen removed.   . ARM AMPUTATION Right   . ELBOW SURGERY    . JOINT REPLACEMENT Bilateral    knee  . SHOULDER FUSION    . SPLENECTOMY    . TOE SURGERY      SOCIAL HISTORY:  Social History   Tobacco Use  . Smoking status: Former Research scientist (life sciences)  . Smokeless tobacco: Former Network engineer Use Topics  . 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     FAMILY HISTORY:  Family History  Problem Relation Age of Onset  . Cancer Mother         bladder  . Heart disease Mother   . Hyperlipidemia Mother   . Hypertension Mother   . Diabetes Father   . Heart disease Father        MI  . Hyperlipidemia Father   . Hypertension Father   . Alcohol abuse Father     DRUG ALLERGIES: No Known Allergies  REVIEW OF SYSTEMS:   CONSTITUTIONAL: Has fever, generalized weakness.  EYES: No blurred or double vision.  EARS, NOSE, AND THROAT: No tinnitus or ear pain.  RESPIRATORY: No cough, shortness of breath, wheezing or hemoptysis.  CARDIOVASCULAR: No chest pain, orthopnea, edema.  GASTROINTESTINAL: No nausea, vomiting, diarrhea or abdominal pain.  GENITOURINARY: No dysuria, hematuria.  ENDOCRINE: No polyuria, nocturia,  HEMATOLOGY: No anemia, easy bruising or bleeding SKIN: No rash or lesion. MUSCULOSKELETAL: No joint pain or arthritis.   Has back pain NEUROLOGIC: No tingling, numbness, weakness.  PSYCHIATRY: No anxiety or depression.   MEDICATIONS AT HOME:  Prior to Admission medications   Medication Sig Start Date End Date Taking? Authorizing Provider  aspirin 81 MG chewable tablet Chew 81 mg by mouth 2 (two) times daily.   Yes [provider]  atenolol (TENORMIN) 25 MG tablet Take 1 tablet (25 mg total) by mouth daily. 11/29/16  Yes Kathrine Haddock, NP  meloxicam (MOBIC) 15 MG tablet Take 15 mg by mouth daily. 06/24/16  Yes [provider]  methocarbamol (ROBAXIN) 750 MG tablet Take 1 tablet (750 mg total) by mouth 4 (four) times daily. Patient taking differently: Take 750 mg every 6 (six) hours as needed by mouth.  10/25/15  Yes Kathrine Haddock, NP  Multiple Vitamin (MULTIVITAMIN) tablet Take 1 tablet by mouth daily.   Yes [provider]  OLANZapine (ZYPREXA) 20 MG tablet Take 2 tablets (40 mg total) by mouth at bedtime. 06/28/15  Yes Hildred Priest, MD  pantoprazole (PROTONIX) 40 MG tablet TAKE 1 TABLET BY MOUTH EVERY DAY 07/22/16  Yes Kathrine Haddock, NP      PHYSICAL EXAMINATION:   VITAL SIGNS:  Blood pressure 105/67, pulse 79, temperature 98.1 F (36.7 C), temperature source Oral, resp. rate 20, height 5\' 4"  (1.626 m), weight 98 kg (216 lb), SpO2 95 %.  GENERAL:  66 y.o.-year-old patient lying in the bed with no acute distress.  EYES: Pupils equal, round, reactive to light and accommodation. No scleral icterus. Extraocular muscles intact.  HEENT: Head atraumatic, normocephalic. Oropharynx and nasopharynx clear.  NECK:  Supple, no jugular venous distention. No thyroid enlargement, no tenderness.  LUNGS: Normal breath sounds bilaterally, no wheezing, rales,rhonchi or crepitation. No use of accessory muscles of respiration.  CARDIOVASCULAR: S1, S2 normal. No murmurs, rubs, or gallops.  ABDOMEN: Soft, nontender, nondistended. Bowel sounds present. No organomegaly or mass.  EXTREMITIES: No pedal edema, cyanosis, or clubbing.  Lower back tenderness present NEUROLOGIC: Cranial nerves II through XII are intact. Muscle strength 5/5 in all extremities. Sensation intact. Gait not checked.  PSYCHIATRIC: The patient is alert and oriented x 3.  SKIN: No obvious rash, lesion, or ulcer.   LABORATORY PANEL:   CBC Recent Labs  Lab 03/31/17 1307  WBC 16.9*  HGB 12.2*  HCT 36.9*  PLT 562*  MCV 84.6  MCH 28.1  MCHC 33.2  RDW 14.7*  LYMPHSABS 1.4  MONOABS 1.5*  EOSABS 0.0  BASOSABS 0.2*   ------------------------------------------------------------------------------------------------------------------  Chemistries  Recent Labs  Lab 03/31/17 1307  NA 134*  K 3.7  CL 96*  CO2 25  GLUCOSE 123*  BUN 12  CREATININE 1.07  CALCIUM 9.0   ------------------------------------------------------------------------------------------------------------------ estimated creatinine clearance is 73.7 mL/min (by C-G formula based on SCr of 1.07 mg/dL). ------------------------------------------------------------------------------------------------------------------ No results for input(s): TSH,  T4TOTAL, T3FREE, THYROIDAB in the last 72 hours.  Invalid input(s): FREET3   Coagulation profile Recent Labs  Lab 03/31/17 1828  INR 1.22   ------------------------------------------------------------------------------------------------------------------- No results for input(s): DDIMER in the last 72 hours. -------------------------------------------------------------------------------------------------------------------  Cardiac Enzymes No results for input(s): CKMB, TROPONINI, MYOGLOBIN in the last 168 hours.  Invalid input(s): CK ------------------------------------------------------------------------------------------------------------------ Invalid input(s): POCBNP  ---------------------------------------------------------------------------------------------------------------  Urinalysis    Component Value Date/Time   COLORURINE YELLOW (A) 03/31/2017 1308   APPEARANCEUR HAZY (A) 03/31/2017 1308   APPEARANCEUR Clear 10/25/2011 1406   LABSPEC 1.018 03/31/2017 1308   LABSPEC 1.004 10/25/2011 1406   PHURINE 5.0 03/31/2017 1308   GLUCOSEU 50 (A) 03/31/2017 1308   GLUCOSEU Negative 10/25/2011 1406   HGBUR NEGATIVE 03/31/2017 1308   BILIRUBINUR NEGATIVE 03/31/2017 1308   BILIRUBINUR Negative 10/25/2011 1406   KETONESUR NEGATIVE 03/31/2017 1308   PROTEINUR NEGATIVE 03/31/2017 1308   NITRITE NEGATIVE 03/31/2017 1308   LEUKOCYTESUR NEGATIVE 03/31/2017 1308   LEUKOCYTESUR Negative 10/25/2011 1406     RADIOLOGY: Dg Chest 2 View  Result Date: 03/31/2017 CLINICAL DATA:  66 year old  male with history of chest tightness. EXAM: CHEST  2 VIEW COMPARISON:  Chest x-ray 06/23/2015. FINDINGS: The patient is rotated to the right on today's exam, resulting in distortion of the mediastinal contours and reduced diagnostic sensitivity and specificity for mediastinal pathology. Lung volumes are normal. Mild blunting of the right costophrenic sulcus which could reflect a trace right  pleural effusion or chronic right pleuroparenchymal scarring. No left pleural effusion. No evidence of pulmonary edema. Heart size is upper limits of normal. Right arm amputation above the elbow. IMPRESSION: 1. Possible trace right pleural effusion versus chronic right pleuroparenchymal scarring in the base of the right hemithorax. 2. No other findings to suggest acute cardiopulmonary disease. Electronically Signed   By: Vinnie Langton M.D.   On: 03/31/2017 18:50   Mr Thoracic Spine Wo Contrast  Result Date: 03/31/2017 CLINICAL DATA:  Bilateral foot numbness. Multiple falls. Chronic back pain. EXAM: MRI THORACIC AND LUMBAR SPINE WITHOUT CONTRAST TECHNIQUE: Multiplanar and multiecho pulse sequences of the thoracic and lumbar spine were obtained without intravenous contrast. COMPARISON:  None. FINDINGS: MRI THORACIC SPINE FINDINGS Alignment:  Mild right convex curvature. Vertebrae: Discogenic endplate signal changes at T9-T10. Hemangioma in the posterior T5 vertebral body. No acute fracture. Cord:  Normal signal and morphology. Paraspinal and other soft tissues: Negative. Disc levels: And T9-T10, there is a small diffuse disc bulge without spinal canal stenosis. At T10-T11, there is a right subarticular protrusion without spinal canal stenosis. There is no spinal canal stenosis at any level. MRI LUMBAR SPINE FINDINGS Segmentation:  Standard. Alignment:  Grade 1 anterolisthesis at L5-S1. Vertebrae: Compression deformity of the L1 vertebral body with approximately 50% height loss anteriorly. No edema. Conus medullaris: Extends to the L2 level and appears normal. Paraspinal and other soft tissues: Negative. Disc levels: T12-L1: Small disc bulge without spinal canal stenosis. Mild left neural foraminal stenosis, partially due to retropulsion of the superior corner of L1. L1-L2: Small disc bulge and mild facet hypertrophy without spinal canal stenosis. Moderate bilateral neural foraminal stenosis. L2-L3: Small disc  bulge. No spinal canal or neural foraminal stenosis. L3-L4: Small disc bulge and mild facet hypertrophy without spinal canal stenosis or neural foraminal stenosis. L4-L5: Disc desiccation with left eccentric bulge. Mild bilateral facet hypertrophy. Moderate left neural foraminal stenosis. No spinal canal stenosis. L5-S1: Grade 1 anterolisthesis secondary to left-greater-than-right facet hypertrophy. Pseudo disc bulge with mild-to-moderate bilateral neural foraminal stenosis but no spinal canal stenosis. Visualized sacrum: Normal. IMPRESSION: MR THORACIC SPINE IMPRESSION Mild lower thoracic degenerative disc disease without spinal canal or neural foraminal stenosis. MR LUMBAR SPINE IMPRESSION Moderate diffuse lumbar degenerative disc disease and facet arthrosis with mild-to-moderate neural foraminal stenosis at multiple levels. No lumbar spinal canal stenosis. Electronically Signed   By: Ulyses Jarred M.D.   On: 03/31/2017 22:51   Mr Lumbar Spine Wo Contrast  Result Date: 03/31/2017 CLINICAL DATA:  Bilateral foot numbness. Multiple falls. Chronic back pain. EXAM: MRI THORACIC AND LUMBAR SPINE WITHOUT CONTRAST TECHNIQUE: Multiplanar and multiecho pulse sequences of the thoracic and lumbar spine were obtained without intravenous contrast. COMPARISON:  None. FINDINGS: MRI THORACIC SPINE FINDINGS Alignment:  Mild right convex curvature. Vertebrae: Discogenic endplate signal changes at T9-T10. Hemangioma in the posterior T5 vertebral body. No acute fracture. Cord:  Normal signal and morphology. Paraspinal and other soft tissues: Negative. Disc levels: And T9-T10, there is a small diffuse disc bulge without spinal canal stenosis. At T10-T11, there is a right subarticular protrusion without spinal canal stenosis. There is no spinal canal  stenosis at any level. MRI LUMBAR SPINE FINDINGS Segmentation:  Standard. Alignment:  Grade 1 anterolisthesis at L5-S1. Vertebrae: Compression deformity of the L1 vertebral body with  approximately 50% height loss anteriorly. No edema. Conus medullaris: Extends to the L2 level and appears normal. Paraspinal and other soft tissues: Negative. Disc levels: T12-L1: Small disc bulge without spinal canal stenosis. Mild left neural foraminal stenosis, partially due to retropulsion of the superior corner of L1. L1-L2: Small disc bulge and mild facet hypertrophy without spinal canal stenosis. Moderate bilateral neural foraminal stenosis. L2-L3: Small disc bulge. No spinal canal or neural foraminal stenosis. L3-L4: Small disc bulge and mild facet hypertrophy without spinal canal stenosis or neural foraminal stenosis. L4-L5: Disc desiccation with left eccentric bulge. Mild bilateral facet hypertrophy. Moderate left neural foraminal stenosis. No spinal canal stenosis. L5-S1: Grade 1 anterolisthesis secondary to left-greater-than-right facet hypertrophy. Pseudo disc bulge with mild-to-moderate bilateral neural foraminal stenosis but no spinal canal stenosis. Visualized sacrum: Normal. IMPRESSION: MR THORACIC SPINE IMPRESSION Mild lower thoracic degenerative disc disease without spinal canal or neural foraminal stenosis. MR LUMBAR SPINE IMPRESSION Moderate diffuse lumbar degenerative disc disease and facet arthrosis with mild-to-moderate neural foraminal stenosis at multiple levels. No lumbar spinal canal stenosis. Electronically Signed   By: Ulyses Jarred M.D.   On: 03/31/2017 22:51    EKG: Orders placed or performed during the hospital encounter of 03/31/17  . ED EKG  . ED EKG  . EKG 12-Lead  . EKG 12-Lead    IMPRESSION AND PLAN: 66 year old male patient with history of hepatitis C, chronic pain, hypertension, schizophrenia, peripheral neuropathy presented to the emergency room with fever and low back pain.  Admitting diagnosis 1. Systemic inflammatory response syndrome 2. Back pain 3. Hyponatremia 4. Dehydration 5 hepatitis C Treatment plan. Admit patient to medical floor IV fluid  hydration Start patient on IV Rocephin antibiotic Follow-up lactic acid level Follow-up electrolytes and cultures Pain management with IV morphine  All the records are reviewed and case discussed with ED provider. Management plans discussed with the patient, family and they are in agreement.  CODE STATUS:FULL CODE Code Status History    Date Active Date Inactive Code Status Order ID Comments User Context   06/23/2015 17:18 06/28/2015 18:11 Full Code [id number]  Gonzella Lex, MD Inpatient   06/20/2015 19:39 06/23/2015 17:18 Full Code [id number]  Gladstone Lighter, MD Inpatient       TOTAL TIME TAKING CARE OF THIS PATIENT: 52 minutes.    Saundra Shelling M.D on 04/01/2017 at 1:59 AM  Between 7am to 6pm - Pager - [phone]  After 6pm go to [email or web address] - password [email or web address] [facility]  Office  [phone]  CC: Primary care physician; Kathrine Haddock, NP

## 2017-04-02 LAB — RAPID HIV SCREEN (HIV 1/2 AB+AG)
HIV 1/2 ANTIBODIES: NONREACTIVE
HIV-1 P24 Antigen - HIV24: NONREACTIVE

## 2017-04-02 LAB — BASIC METABOLIC PANEL
ANION GAP: 8 (ref 5–15)
BUN: 11 mg/dL (ref 6–20)
CALCIUM: 8.4 mg/dL — AB (ref 8.9–10.3)
CO2: 23 mmol/L (ref 22–32)
Chloride: 103 mmol/L (ref 101–111)
Creatinine, Ser: 0.87 mg/dL (ref 0.61–1.24)
GLUCOSE: 166 mg/dL — AB (ref 65–99)
POTASSIUM: 3.6 mmol/L (ref 3.5–5.1)
SODIUM: 134 mmol/L — AB (ref 135–145)

## 2017-04-02 LAB — CBC
HCT: 33 % — ABNORMAL LOW (ref 40.0–52.0)
Hemoglobin: 10.9 g/dL — ABNORMAL LOW (ref 13.0–18.0)
MCH: 27.9 pg (ref 26.0–34.0)
MCHC: 33 g/dL (ref 32.0–36.0)
MCV: 84.5 fL (ref 80.0–100.0)
PLATELETS: 463 10*3/uL — AB (ref 150–440)
RBC: 3.9 MIL/uL — ABNORMAL LOW (ref 4.40–5.90)
RDW: 14.8 % — AB (ref 11.5–14.5)
WBC: 12.7 10*3/uL — AB (ref 3.8–10.6)

## 2017-04-02 LAB — HEPATIC FUNCTION PANEL
ALBUMIN: 2.6 g/dL — AB (ref 3.5–5.0)
ALT: 28 U/L (ref 17–63)
AST: 30 U/L (ref 15–41)
Alkaline Phosphatase: 44 U/L (ref 38–126)
BILIRUBIN DIRECT: 0.2 mg/dL (ref 0.1–0.5)
Indirect Bilirubin: 0.6 mg/dL (ref 0.3–0.9)
TOTAL PROTEIN: 6.7 g/dL (ref 6.5–8.1)
Total Bilirubin: 0.8 mg/dL (ref 0.3–1.2)

## 2017-04-02 LAB — C-REACTIVE PROTEIN: CRP: 13.1 mg/dL — AB (ref <1.0)

## 2017-04-02 LAB — INFLUENZA PANEL BY PCR (TYPE A & B)
INFLAPCR: NEGATIVE
INFLBPCR: NEGATIVE

## 2017-04-02 LAB — FOLATE RBC
FOLATE, HEMOLYSATE: 407.6 ng/mL
FOLATE, RBC: 1220 ng/mL (ref >498)
HEMATOCRIT: 33.4 % — AB (ref 37.5–51.0)

## 2017-04-02 LAB — SEDIMENTATION RATE: SED RATE: 114 mm/h — AB (ref 0–20)

## 2017-04-02 NOTE — Evaluation (Signed)
Physical Therapy Evaluation Patient Details Name: Ryan [name] MRN: [id number] DOB: 09-05-1950 Today's Date: 04/02/2017   History of Present Illness  Pt was admitted to hospital s/p fever and SIRS and also reporting Tx and Lx pain.  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  Clinical Impression  Pt is a 66 year old male with lives in a single story home alone.  He states that his friend and brother act as his caretakers and that he only lives in the main area of his home.  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  Pt presents with an unsteady gait and erratic movements.  PT cued pt for safety multiple times and monitored IV closely as pt almost pulled it out of his arm 2-3 times.  Pt is able to perform transfers mod. I. But requires VC's for safety awareness.  Mobility was limited by pt reported pain and fatigue.  Pt will continue to benefit from skilled PT with focus on balance, safety awareness, stair negotiation and tolerance to activity.    Follow Up Recommendations Home health PT    Equipment Recommendations  (Grab bars for shower and toilet extender.)    Recommendations for Other Services       Precautions / Restrictions Precautions Precautions: Fall Precaution Comments: Pt is erratic and resistant to instruction.      Mobility  Bed Mobility Overal bed mobility: Modified Independent             General bed mobility comments: Pt is able to perform supine to sit and sit to supine with unilateral use of bedrail or hand hold assist.  Transfers Overall transfer level: Modified independent Equipment used: Straight cane             General transfer comment: Pt requires VC's for safety awareness and management of IV, which he became tangled in, pulling the tube.  PT was able to redirect pt and repeated directions 2-3x regarding safety  awareness.  Ambulation/Gait Ambulation/Gait assistance: Modified independent (Device/Increase time) Ambulation Distance (Feet): 60 Feet Assistive device: Straight cane Gait Pattern/deviations: Step-to pattern;Decreased weight shift to left;Narrow base of support;Trunk flexed        Stairs            Wheelchair Mobility    Modified Rankin (Stroke Patients Only)       Balance                                             Pertinent Vitals/Pain Pain Assessment: 0-10 Pain Score: 7  Pain Location: Tx and Lx spine Pain Descriptors / Indicators: Aching    Home Living Family/patient expects to be discharged to:: Private residence Living Arrangements: Alone Available Help at Discharge: Friend(s);Family Type of Home: House Home Access: Stairs to enter Entrance Stairs-Rails: None Entrance Stairs-Number of Steps: 5 Home Layout: One level(Pt states that he lives only in the main area of the house and does not use most of the rooms.) Home Equipment: Kasandra Knudsen - single point      Prior Function Level of Independence: Needs assistance   Gait / Transfers Assistance Needed: Pt uses SPC for home ambulation and is able to perform toilet transfers.  ADL's / Homemaking Assistance Needed: Pt has a friend  who assists with grocery shopping and cleaning his house.        Hand Dominance        Extremity/Trunk Assessment   Upper Extremity Assessment Upper Extremity Assessment: Overall WFL for tasks assessed(Pt is able to use full strength of L UE for use of SPC but has a hx of L UE amputation.)    Lower Extremity Assessment Lower Extremity Assessment: Overall WFL for tasks assessed    Cervical / Trunk Assessment Cervical / Trunk Assessment: Kyphotic(Pt remains in a flexed posture.)  Communication   Communication: Other (comment)(Pt is erratic and sometimes resistant to instruction.)  Cognition Arousal/Alertness: Awake/alert Behavior During Therapy:  Agitated Overall Cognitive Status: Within Functional Limits for tasks assessed                                        General Comments      Exercises     Assessment/Plan    PT Assessment Patient needs continued PT services  PT Problem List Decreased balance;Decreased activity tolerance;Decreased mobility;Decreased safety awareness;Pain       PT Treatment Interventions DME instruction;Gait training;Stair training;Functional mobility training;Therapeutic activities;Therapeutic exercise;Balance training;Neuromuscular re-education;Patient/family education;Cognitive remediation    PT Goals (Current goals can be found in the Care Plan section)       Frequency Min 2X/week   Barriers to discharge        Co-evaluation               AM-PAC PT "6 Clicks" Daily Activity  Outcome Measure Difficulty turning over in bed (including adjusting bedclothes, sheets and blankets)?: A Little Difficulty moving from lying on back to sitting on the side of the bed? : A Little Difficulty sitting down on and standing up from a chair with arms (e.g., wheelchair, bedside commode, etc,.)?: A Little Help needed moving to and from a bed to chair (including a wheelchair)?: A Little Help needed walking in hospital room?: A Little Help needed climbing 3-5 steps with a railing? : A Lot 6 Click Score: 17    End of Session Equipment Utilized During Treatment: Gait belt Activity Tolerance: Patient limited by pain;Treatment limited secondary to agitation Patient left: in bed;with family/visitor present;with nursing/sitter in room Nurse Communication: Mobility status;Precautions PT Visit Diagnosis: Unsteadiness on feet (R26.81);Other abnormalities of gait and mobility (R26.89);Pain Pain - part of body: (Cervical and thoracic spine)    Time: 1220-1250 PT Time Calculation (min) (ACUTE ONLY): 30 min   Charges:   PT Evaluation $PT Eval Moderate Complexity: 1 Mod     PT G Codes:   PT  G-Codes **NOT FOR INPATIENT CLASS** Functional Assessment Tool Used: AM-PAC 6 Clicks Basic Mobility Functional Limitation: Mobility: Walking and moving around Mobility: Walking and Moving Around Current Status (I3704): At least 60 percent but less than 80 percent impaired, limited or restricted Mobility: Walking and Moving Around Goal Status [phone]): At least 1 percent but less than 20 percent impaired, limited or restricted    Roxanne Gates, PT, DPT  Roxanne Gates 04/02/2017, 1:19 PM

## 2017-04-02 NOTE — Evaluation (Signed)
Occupational Therapy Evaluation Patient Details Name: Ryan [name] MRN: [id number] DOB: 1951/03/22 Today's Date: 04/02/2017    History of Present Illness Pt. is a 66 y.o. male who was admitted to [facility] with a fever, SIRS, and back pain. Pt. 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   Clinical Impression   Pt. Is a 66 y.o. Male who was admitted to [facility] with a fever, SIRS, and back pain.  Pt. Presents with a RUE amputation, weakness, pain, and impulsivity which limit his ability to complete ADL, and IADL tasks. Pt. resides home alone. Pt. reports being independent with ADLs, cooking, meal preparation, and medication management. Pt. reports having a friend assist with IADL tasks as needed, driving, and shopping. Pt. requires frequent cuing for redirection, as well as cuing for safety, and impulsivity. Pt. could benefit from skilled OT services for ADL training, A/E training, UE there. Ex., there. Act., and pt. education about work simplification techniques, and safety awareness./judgement. Pt. Plans to return home upon discharge. Pt. could benefit from follow-up OT services.    Follow Up Recommendations  Home health OT    Equipment Recommendations       Recommendations for Other Services       Precautions / Restrictions Precautions Precautions: Fall Precaution Comments: Pt is erratic and resistant to instruction. Restrictions Weight Bearing Restrictions: No             ADL either performed or assessed with clinical judgement   ADL   Eating/Feeding: Set up   Grooming: Set up;Supervision/safety   Upper Body Bathing: Set up;Minimal assistance   Lower Body Bathing: Set up;Minimal assistance   Upper Body Dressing : Set up;Supervision/safety   Lower Body Dressing: Set up;Supervision/safety               Functional mobility during ADLs: Modified independent;Cueing for safety General ADL Comments: Pt. is impulsive.      Vision         Perception     Praxis      Pertinent Vitals/Pain Pain Assessment: 0-10 Pain Score: 7  Pain Location: Back Pain Descriptors / Indicators: Aching Pain Intervention(s): Limited activity within patient's tolerance;Monitored during session     Hand Dominance     Extremity/Trunk Assessment Upper Extremity Assessment Upper Extremity Assessment: RUE deficits/detail RUE Deficits / Details: RUE Amputation at the shoulder      Cervical / Trunk Assessment Cervical / Trunk Assessment: Kyphotic(Pt remains in a flexed posture.)   Communication Communication Communication: Other (comment)(Pt is erratic and sometimes resistant to instruction.)   Cognition Arousal/Alertness: Awake/alert Behavior During Therapy: Impulsive Overall Cognitive Status: Within Functional Limits for tasks assessed                                     General Comments       Exercises     Shoulder Instructions      Home Living Family/patient expects to be discharged to:: Private residence Living Arrangements: Alone Available Help at Discharge: Family;Friend(s) Type of Home: House Home Access: Stairs to enter [employer] of Steps: 5 Entrance Stairs-Rails: None Home Layout: Able to live on main level with bedroom/bathroom     Bathroom Shower/Tub: Teacher, early years/pre: Standard Bathroom Accessibility: Yes   Home Equipment: Cane - single point          Prior Functioning/Environment Level of Independence: Needs assistance  Gait / Transfers Assistance Needed: Pt uses SPC for home ambulation and is able to perform toilet transfers. ADL's / Homemaking Assistance Needed: Pt. reports being indpendent with ADL tasks at home. Pt. reports having a friend "Ryan [name]" who assists him with IADL tasks.             OT Problem List: Decreased strength      OT Treatment/Interventions: Self-care/ADL training;Therapeutic exercise;Cognitive  remediation/compensation;Therapeutic activities    OT Goals(Current goals can be found in the care plan section) Acute Rehab OT Goals Patient Stated Goal: To return home OT Goal Formulation: With patient Potential to Achieve Goals: Good  OT Frequency: Min 1X/week   Barriers to D/C:            Co-evaluation              AM-PAC PT "6 Clicks" Daily Activity     Outcome Measure Help from another person eating meals?: None Help from another person taking care of personal grooming?: None Help from another person toileting, which includes using toliet, bedpan, or urinal?: A Little Help from another person bathing (including washing, rinsing, drying)?: A Little Help from another person to put on and taking off regular upper body clothing?: A Little Help from another person to put on and taking off regular lower body clothing?: A Little 6 Click Score: 20   End of Session    Activity Tolerance: Patient tolerated treatment well Patient left: in bed  OT Visit Diagnosis: Unsteadiness on feet (R26.81)                Time: 0932-6712 OT Time Calculation (min): 20 min Charges:  OT General Charges $OT Visit: 1 Visit OT Evaluation $OT Eval Low Complexity: 1 Low G-Codes: OT G-codes **NOT FOR INPATIENT CLASS** Functional Limitation: Self care Self Care Current Status (W5809): At least 1 percent but less than 20 percent impaired, limited or restricted Self Care Goal Status (X8338): At least 1 percent but less than 20 percent impaired, limited or restricted   Harrel Carina, MS, OTR/L   Harrel Carina, MS, OTR/L 04/02/2017, 4:20 PM

## 2017-04-02 NOTE — Consult Note (Signed)
[facility] Infectious Disease     Reason for Consult:Sepsis,   Referring Physician: Dr Manuella Ghazi Date of Admission:  03/31/2017   Active Problems:   SIRS (systemic inflammatory response syndrome) (HCC)   HPI: Ryan [name] is a 66 y.o. male admitted with fevers, back pain and increasing falls. He has hx of Chronic pain, Hep C (treated), HTN, PN and Schizophrenia.  He is a R arm amputee and also has multiple abrasions from falling. Has a L elbow wound as well which is tender. On admit wbc was 17 and temp 101.6.  Flu PCR neg CXR neg and MRI without contrast negative.  Past Medical History:  Diagnosis Date  . Chronic pain   . Hepatitis C   . Hypertension   . Peripheral neuropathy   . Schizophrenia [facility])    Past Surgical History:  Procedure Laterality Date  . ABDOMINAL SURGERY     spleen removed.   . ARM AMPUTATION Right   . ELBOW SURGERY    . JOINT REPLACEMENT Bilateral    knee  . SHOULDER FUSION    . SPLENECTOMY    . TOE SURGERY     Social History   Tobacco Use  . Smoking status: Former Research scientist (life sciences)  . Smokeless tobacco: Former Network engineer Use Topics  . 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   . Drug use: No   Family History  Problem Relation Age of Onset  . Cancer Mother        bladder  . Heart disease Mother   . Hyperlipidemia Mother   . Hypertension Mother   . Diabetes Father   . Heart disease Father        MI  . Hyperlipidemia Father   . Hypertension Father   . Alcohol abuse Father     Allergies: No Known Allergies  Current antibiotics: Antibiotics Given (last 72 hours)    Date/Time Action Medication Dose Rate   03/31/17 2351 New Bag/Given   cefTRIAXone (ROCEPHIN) 2 g in dextrose 5 % 50 mL IVPB 2 g 100 mL/hr   04/01/17 1719 New Bag/Given   cefTRIAXone (ROCEPHIN) 2 g in dextrose 5 % 50 mL IVPB 2 g 100 mL/hr      MEDICATIONS: . aspirin  81 mg Oral BID  . atenolol  25 mg Oral Daily   . enoxaparin (LOVENOX) injection  40 mg Subcutaneous Q24H  . multivitamin with minerals  1 tablet Oral Daily  . OLANZapine  40 mg Oral QHS  . pantoprazole  40 mg Oral Daily    Review of Systems - 11 systems reviewed and negative per HPI   OBJECTIVE: Temp:  [97.7 F (36.5 C)-101.5 F (38.6 C)] 97.9 F (36.6 C) (11/14 1242) Pulse Rate:  [72-109] 82 (11/14 1242) Resp:  [18-20] 18 (11/14 0956) BP: (113-143)/(61-87) 113/87 (11/14 1242) SpO2:  [94 %-98 %] 95 % (11/14 1242) Physical Exam  Constitutional: He is alert but somewhat agitated.  HENT: anicteric Mouth/Throat: Oropharynx is clear and moist. No oropharyngeal exudate.  Cardiovascular: Normal rate, regular rhythm and normal heart sounds. Pulmonary/Chest: Effort normal and breath sounds normal. No respiratory distress. He has no wheezes.  Abdominal: Soft. Bowel sounds are normal. He exhibits no distension. There is no tenderness.  Lymphadenopathy: He has no cervical adenopathy.  Neurological: He is alert and oriented to person, place, and time.  Ext R arm amputee, L elbow with large wound over  olecranon bursa, good rom though  Skin: L olecranon region with large wound with purulent base Psychiatric: He is agitated and insisting on getting oob   LABS: Results for orders placed or performed during the hospital encounter of 03/31/17 (from the past 48 hour(s))  Lactic acid, plasma     Status: Abnormal   Collection Time: 03/31/17  6:28 PM  Result Value Ref Range   Lactic Acid, Venous 3.4 (HH) 0.5 - 1.9 mmol/L    Comment: CRITICAL RESULT CALLED TO, READ BACK BY AND VERIFIED WITH RAQUEL Aniyha Tate ON 03/31/17 AT [facility]   Protime-INR     Status: Abnormal   Collection Time: 03/31/17  6:28 PM  Result Value Ref Range   Prothrombin Time 15.3 (H) 11.4 - 15.2 seconds   INR 1.22   Culture, blood (Routine x 2)     Status: None (Preliminary result)   Collection Time: 03/31/17  6:29 PM  Result Value Ref Range   Specimen Description BLOOD  LEFT HAND    Special Requests      BOTTLES DRAWN AEROBIC AND ANAEROBIC Blood Culture adequate volume   Culture NO GROWTH 2 DAYS    Report Status PENDING   Culture, blood (Routine x 2)     Status: None (Preliminary result)   Collection Time: 03/31/17  6:29 PM  Result Value Ref Range   Specimen Description BLOOD LAC    Special Requests      BOTTLES DRAWN AEROBIC AND ANAEROBIC Blood Culture adequate volume   Culture NO GROWTH 2 DAYS    Report Status PENDING   Influenza panel by PCR (type A & B)     Status: None   Collection Time: 03/31/17  6:29 PM  Result Value Ref Range   Influenza A By PCR NEGATIVE NEGATIVE   Influenza B By PCR NEGATIVE NEGATIVE    Comment: (NOTE) The Xpert Xpress Flu assay is intended as an aid in the diagnosis of  influenza and should not be used as a sole basis for treatment.  This  assay is FDA approved for nasopharyngeal swab specimens only. Nasal  washings and aspirates are unacceptable for Xpert Xpress Flu testing.   Folate RBC     Status: Abnormal   Collection Time: 03/31/17  7:47 PM  Result Value Ref Range   Folate, Hemolysate 407.6 Not Estab. ng/mL   Hematocrit 33.4 (L) 37.5 - 51.0 %   Folate, RBC 1,220 >498 ng/mL    Comment: (NOTE) Performed At: [facility] [place], [place] [id number] Rush Farmer MD [id number]   Vitamin B12     Status: None   Collection Time: 03/31/17  7:48 PM  Result Value Ref Range   Vitamin B-12 834 180 - 914 pg/mL    Comment: (NOTE) This assay is not validated for testing neonatal or myeloproliferative syndrome specimens for Vitamin B12 levels. Performed at [facility], [place] [address] [place], [place] [address]   Lactic acid, plasma     Status: Abnormal   Collection Time: 03/31/17  9:20 PM  Result Value Ref Range   Lactic Acid, Venous 2.0 (HH) 0.5 - 1.9 mmol/L    Comment: CRITICAL RESULT CALLED TO, READ BACK BY AND VERIFIED WITH AMY COHEN ON 03/31/17 AT [facility]   Basic metabolic  panel     Status: Abnormal   Collection Time: 04/01/17  4:38 AM  Result Value Ref Range   Sodium 132 (L) 135 - 145 mmol/L   Potassium 3.0 (L) 3.5 -  5.1 mmol/L   Chloride 98 (L) 101 - 111 mmol/L   CO2 25 22 - 32 mmol/L   Glucose, Bld 177 (H) 65 - 99 mg/dL   BUN 12 6 - 20 mg/dL   Creatinine, Ser 0.92 0.61 - 1.24 mg/dL   Calcium 8.3 (L) 8.9 - 10.3 mg/dL   GFR calc non Af Amer >60 >60 mL/min   GFR calc Af Amer >60 >60 mL/min    Comment: (NOTE) The eGFR has been calculated using the CKD EPI equation. This calculation has not been validated in all clinical situations. eGFR's persistently <60 mL/min signify possible Chronic Kidney Disease.    Anion gap 9 5 - 15  CBC     Status: Abnormal   Collection Time: 04/01/17  4:38 AM  Result Value Ref Range   WBC 13.5 (H) 3.8 - 10.6 K/uL   RBC 3.72 (L) 4.40 - 5.90 MIL/uL   Hemoglobin 10.4 (L) 13.0 - 18.0 g/dL   HCT 31.4 (L) 40.0 - 52.0 %   MCV 84.3 80.0 - 100.0 fL   MCH 27.9 26.0 - 34.0 pg   MCHC 33.1 32.0 - 36.0 g/dL   RDW 14.7 (H) 11.5 - 14.5 %   Platelets 471 (H) 150 - 440 K/uL  CBC     Status: Abnormal   Collection Time: 04/02/17  5:02 AM  Result Value Ref Range   WBC 12.7 (H) 3.8 - 10.6 K/uL   RBC 3.90 (L) 4.40 - 5.90 MIL/uL   Hemoglobin 10.9 (L) 13.0 - 18.0 g/dL   HCT 33.0 (L) 40.0 - 52.0 %   MCV 84.5 80.0 - 100.0 fL   MCH 27.9 26.0 - 34.0 pg   MCHC 33.0 32.0 - 36.0 g/dL   RDW 14.8 (H) 11.5 - 14.5 %   Platelets 463 (H) 150 - 440 K/uL  Basic metabolic panel     Status: Abnormal   Collection Time: 04/02/17  5:02 AM  Result Value Ref Range   Sodium 134 (L) 135 - 145 mmol/L   Potassium 3.6 3.5 - 5.1 mmol/L   Chloride 103 101 - 111 mmol/L   CO2 23 22 - 32 mmol/L   Glucose, Bld 166 (H) 65 - 99 mg/dL   BUN 11 6 - 20 mg/dL   Creatinine, Ser 0.87 0.61 - 1.24 mg/dL   Calcium 8.4 (L) 8.9 - 10.3 mg/dL   GFR calc non Af Amer >60 >60 mL/min   GFR calc Af Amer >60 >60 mL/min    Comment: (NOTE) The eGFR has been calculated using the CKD  EPI equation. This calculation has not been validated in all clinical situations. eGFR's persistently <60 mL/min signify possible Chronic Kidney Disease.    Anion gap 8 5 - 15  Influenza panel by PCR (type A & B)     Status: None   Collection Time: 04/02/17 11:36 AM  Result Value Ref Range   Influenza A By PCR NEGATIVE NEGATIVE   Influenza B By PCR NEGATIVE NEGATIVE    Comment: (NOTE) The Xpert Xpress Flu assay is intended as an aid in the diagnosis of  influenza and should not be used as a sole basis for treatment.  This  assay is FDA approved for nasopharyngeal swab specimens only. Nasal  washings and aspirates are unacceptable for Xpert Xpress Flu testing.    No components found for: ESR, C REACTIVE PROTEIN MICRO: Recent Results (from the past 720 hour(s))  Culture, blood (Routine x 2)     Status: None (  Preliminary result)   Collection Time: 03/31/17  6:29 PM  Result Value Ref Range Status   Specimen Description BLOOD LEFT HAND  Final   Special Requests   Final    BOTTLES DRAWN AEROBIC AND ANAEROBIC Blood Culture adequate volume   Culture NO GROWTH 2 DAYS  Final   Report Status PENDING  Incomplete  Culture, blood (Routine x 2)     Status: None (Preliminary result)   Collection Time: 03/31/17  6:29 PM  Result Value Ref Range Status   Specimen Description BLOOD LAC  Final   Special Requests   Final    BOTTLES DRAWN AEROBIC AND ANAEROBIC Blood Culture adequate volume   Culture NO GROWTH 2 DAYS  Final   Report Status PENDING  Incomplete    IMAGING: Dg Chest 2 View  Result Date: 03/31/2017 CLINICAL DATA:  66 year old male with history of chest tightness. EXAM: CHEST  2 VIEW COMPARISON:  Chest x-ray 06/23/2015. FINDINGS: The patient is rotated to the right on today's exam, resulting in distortion of the mediastinal contours and reduced diagnostic sensitivity and specificity for mediastinal pathology. Lung volumes are normal. Mild blunting of the right costophrenic sulcus which  could reflect a trace right pleural effusion or chronic right pleuroparenchymal scarring. No left pleural effusion. No evidence of pulmonary edema. Heart size is upper limits of normal. Right arm amputation above the elbow. IMPRESSION: 1. Possible trace right pleural effusion versus chronic right pleuroparenchymal scarring in the base of the right hemithorax. 2. No other findings to suggest acute cardiopulmonary disease. Electronically Signed   By: Vinnie Langton M.D.   On: 03/31/2017 18:50   Mr Thoracic Spine Wo Contrast  Result Date: 03/31/2017 CLINICAL DATA:  Bilateral foot numbness. Multiple falls. Chronic back pain. EXAM: MRI THORACIC AND LUMBAR SPINE WITHOUT CONTRAST TECHNIQUE: Multiplanar and multiecho pulse sequences of the thoracic and lumbar spine were obtained without intravenous contrast. COMPARISON:  None. FINDINGS: MRI THORACIC SPINE FINDINGS Alignment:  Mild right convex curvature. Vertebrae: Discogenic endplate signal changes at T9-T10. Hemangioma in the posterior T5 vertebral body. No acute fracture. Cord:  Normal signal and morphology. Paraspinal and other soft tissues: Negative. Disc levels: And T9-T10, there is a small diffuse disc bulge without spinal canal stenosis. At T10-T11, there is a right subarticular protrusion without spinal canal stenosis. There is no spinal canal stenosis at any level. MRI LUMBAR SPINE FINDINGS Segmentation:  Standard. Alignment:  Grade 1 anterolisthesis at L5-S1. Vertebrae: Compression deformity of the L1 vertebral body with approximately 50% height loss anteriorly. No edema. Conus medullaris: Extends to the L2 level and appears normal. Paraspinal and other soft tissues: Negative. Disc levels: T12-L1: Small disc bulge without spinal canal stenosis. Mild left neural foraminal stenosis, partially due to retropulsion of the superior corner of L1. L1-L2: Small disc bulge and mild facet hypertrophy without spinal canal stenosis. Moderate bilateral neural foraminal  stenosis. L2-L3: Small disc bulge. No spinal canal or neural foraminal stenosis. L3-L4: Small disc bulge and mild facet hypertrophy without spinal canal stenosis or neural foraminal stenosis. L4-L5: Disc desiccation with left eccentric bulge. Mild bilateral facet hypertrophy. Moderate left neural foraminal stenosis. No spinal canal stenosis. L5-S1: Grade 1 anterolisthesis secondary to left-greater-than-right facet hypertrophy. Pseudo disc bulge with mild-to-moderate bilateral neural foraminal stenosis but no spinal canal stenosis. Visualized sacrum: Normal. IMPRESSION: MR THORACIC SPINE IMPRESSION Mild lower thoracic degenerative disc disease without spinal canal or neural foraminal stenosis. MR LUMBAR SPINE IMPRESSION Moderate diffuse lumbar degenerative disc disease and facet arthrosis with mild-to-moderate  neural foraminal stenosis at multiple levels. No lumbar spinal canal stenosis. Electronically Signed   By: Ulyses Jarred M.D.   On: 03/31/2017 22:51   Mr Lumbar Spine Wo Contrast  Result Date: 03/31/2017 CLINICAL DATA:  Bilateral foot numbness. Multiple falls. Chronic back pain. EXAM: MRI THORACIC AND LUMBAR SPINE WITHOUT CONTRAST TECHNIQUE: Multiplanar and multiecho pulse sequences of the thoracic and lumbar spine were obtained without intravenous contrast. COMPARISON:  None. FINDINGS: MRI THORACIC SPINE FINDINGS Alignment:  Mild right convex curvature. Vertebrae: Discogenic endplate signal changes at T9-T10. Hemangioma in the posterior T5 vertebral body. No acute fracture. Cord:  Normal signal and morphology. Paraspinal and other soft tissues: Negative. Disc levels: And T9-T10, there is a small diffuse disc bulge without spinal canal stenosis. At T10-T11, there is a right subarticular protrusion without spinal canal stenosis. There is no spinal canal stenosis at any level. MRI LUMBAR SPINE FINDINGS Segmentation:  Standard. Alignment:  Grade 1 anterolisthesis at L5-S1. Vertebrae: Compression deformity of  the L1 vertebral body with approximately 50% height loss anteriorly. No edema. Conus medullaris: Extends to the L2 level and appears normal. Paraspinal and other soft tissues: Negative. Disc levels: T12-L1: Small disc bulge without spinal canal stenosis. Mild left neural foraminal stenosis, partially due to retropulsion of the superior corner of L1. L1-L2: Small disc bulge and mild facet hypertrophy without spinal canal stenosis. Moderate bilateral neural foraminal stenosis. L2-L3: Small disc bulge. No spinal canal or neural foraminal stenosis. L3-L4: Small disc bulge and mild facet hypertrophy without spinal canal stenosis or neural foraminal stenosis. L4-L5: Disc desiccation with left eccentric bulge. Mild bilateral facet hypertrophy. Moderate left neural foraminal stenosis. No spinal canal stenosis. L5-S1: Grade 1 anterolisthesis secondary to left-greater-than-right facet hypertrophy. Pseudo disc bulge with mild-to-moderate bilateral neural foraminal stenosis but no spinal canal stenosis. Visualized sacrum: Normal. IMPRESSION: MR THORACIC SPINE IMPRESSION Mild lower thoracic degenerative disc disease without spinal canal or neural foraminal stenosis. MR LUMBAR SPINE IMPRESSION Moderate diffuse lumbar degenerative disc disease and facet arthrosis with mild-to-moderate neural foraminal stenosis at multiple levels. No lumbar spinal canal stenosis. Electronically Signed   By: Ulyses Jarred M.D.   On: 03/31/2017 22:51    Assessment:   Tomas Schamp is a 66 y.o. male admitted with fevers x 1 day, back pain acute on chronic, progressive LE weakness.  He had temp 101.6 and wbc 17. CXR neg, T and L spine MRI neg for infection, BCX pending, UA neg.  He has an infected abrasion over L olecranon bursa which I suspect is the source and have cultured.   Recommendations Add vanco.  Cont ceftriaxone pending cultures of wound and BCX Check LFTs, HIV, esr and CRP Wound care consult Thank you very much for allowing me  to participate in the care of this patient. Please call with questions.   Cheral Marker. Ola Spurr, MD

## 2017-04-02 NOTE — Progress Notes (Signed)
[facility] NAME: Purnell Daigle    MR#:  [id number]  DATE OF BIRTH:  12/24/1950  SUBJECTIVE:  CHIEF COMPLAINT:   Chief Complaint  Patient presents with  . Numbness  Febrile REVIEW OF SYSTEMS:  Review of Systems  Constitutional: Positive for fever. Negative for chills and weight loss.  HENT: Negative for nosebleeds and sore throat.   Eyes: Negative for blurred vision.  Respiratory: Negative for cough, shortness of breath and wheezing.   Cardiovascular: Negative for chest pain, orthopnea, leg swelling and PND.  Gastrointestinal: Negative for abdominal pain, constipation, diarrhea, heartburn, nausea and vomiting.  Genitourinary: Negative for dysuria and urgency.  Musculoskeletal: Negative for back pain.  Skin: Negative for rash.  Neurological: Positive for sensory change. Negative for dizziness, speech change, focal weakness and headaches.  Endo/Heme/Allergies: Does not bruise/bleed easily.  Psychiatric/Behavioral: Negative for depression.    DRUG ALLERGIES:  No Known Allergies VITALS:  Blood pressure 113/87, pulse 82, temperature 97.9 F (36.6 C), temperature source Oral, resp. rate 18, height 5\' 4"  (1.626 m), weight 98 kg (216 lb), SpO2 95 %. PHYSICAL EXAMINATION:  Physical Exam  Constitutional: He is oriented to person, place, and time and well-developed, well-nourished, and in no distress.  HENT:  Head: Normocephalic and atraumatic.  Eyes: Conjunctivae and EOM are normal. Pupils are equal, round, and reactive to light.  Neck: Normal range of motion. Neck supple. No tracheal deviation present. No thyromegaly present.  Cardiovascular: Normal rate, regular rhythm and normal heart sounds.  Pulmonary/Chest: Effort normal and breath sounds normal. No respiratory distress. He has no wheezes. He exhibits no tenderness.  Abdominal: Soft. Bowel sounds are normal. He exhibits no distension. There is no tenderness.    Musculoskeletal: Normal range of motion.  Neurological: He is alert and oriented to person, place, and time. No cranial nerve deficit.  Skin: Skin is warm and dry. Rash noted.  Ext R arm amputee, L elbow with large wound over olecranon bursa  Skin: L olecranon region with large wound with purulent base    Psychiatric: Mood and affect normal.   LABORATORY PANEL:  Male CBC Recent Labs  Lab 04/02/17 0502  WBC 12.7*  HGB 10.9*  HCT 33.0*  PLT 463*   ------------------------------------------------------------------------------------------------------------------ Chemistries  Recent Labs  Lab 04/02/17 0502  NA 134*  K 3.6  CL 103  CO2 23  GLUCOSE 166*  BUN 11  CREATININE 0.87  CALCIUM 8.4*   RADIOLOGY:  No results found. ASSESSMENT AND PLAN:  66 year old male patient with history of hepatitis C, chronic pain, hypertension, schizophrenia, peripheral neuropathy admitted with fever and low back pain.  1. Sepsis: Present on admission, can be due to L olecranon region with large wound with purulent base -Appreciate ID input -Continue Rocephin, add vancomycin per ID  2. Back pain/numbness in the lower extremities: Seem to be chronic, monitor, outpatient orthopedic evaluation 3. Hyponatremia/hypokalemia: Repleted and resolved 4. Dehydration: Continue IV hydration and monitor 5 hepatitis C: Chronic    Physical therapy evaluation -home health with physical therapy   All the records are reviewed and case discussed with Care Management/Social Worker. Management plans discussed with the patient, nursing and they are in agreement.  CODE STATUS: Full Code  TOTAL TIME TAKING CARE OF THIS PATIENT: 25 minutes.   More than 50% of the time was spent in counseling/coordination of care: YES  POSSIBLE D/C IN 1-2 DAYS, DEPENDING ON CLINICAL CONDITION.  And ID evaluation   Max Sane M.D on 04/02/2017 at 5:38 PM  Between 7am to 6pm - Pager - [phone]  After 6pm go to  [email or web address] - Proofreader  [facility]  Office  [phone]  CC: Primary care physician; Kathrine Haddock, NP  Note: This dictation was prepared with Dragon dictation along with smaller phrase technology. Any transcriptional errors that result from this process are unintentional.

## 2017-04-03 ENCOUNTER — Inpatient Hospital Stay: Payer: Medicare Other

## 2017-04-03 ENCOUNTER — Encounter: Payer: Self-pay | Admitting: Nurse Practitioner

## 2017-04-03 DIAGNOSIS — R509 Fever, unspecified: Secondary | ICD-10-CM

## 2017-04-03 DIAGNOSIS — I471 Supraventricular tachycardia: Secondary | ICD-10-CM

## 2017-04-03 DIAGNOSIS — R52 Pain, unspecified: Secondary | ICD-10-CM

## 2017-04-03 DIAGNOSIS — R6510 Systemic inflammatory response syndrome (SIRS) of non-infectious origin without acute organ dysfunction: Secondary | ICD-10-CM

## 2017-04-03 LAB — BASIC METABOLIC PANEL
ANION GAP: 9 (ref 5–15)
BUN: 9 mg/dL (ref 6–20)
CHLORIDE: 99 mmol/L — AB (ref 101–111)
CO2: 26 mmol/L (ref 22–32)
CREATININE: 0.77 mg/dL (ref 0.61–1.24)
Calcium: 8.2 mg/dL — ABNORMAL LOW (ref 8.9–10.3)
GFR calc non Af Amer: >60 mL/min (ref >60)
Glucose, Bld: 91 mg/dL (ref 65–99)
POTASSIUM: 3.4 mmol/L — AB (ref 3.5–5.1)
Sodium: 134 mmol/L — ABNORMAL LOW (ref 135–145)

## 2017-04-03 LAB — RPR: RPR Ser Ql: NONREACTIVE

## 2017-04-03 LAB — CBC
HCT: 32.5 % — ABNORMAL LOW (ref 40.0–52.0)
Hemoglobin: 10.8 g/dL — ABNORMAL LOW (ref 13.0–18.0)
MCH: 27.9 pg (ref 26.0–34.0)
MCHC: 33.1 g/dL (ref 32.0–36.0)
MCV: 84.2 fL (ref 80.0–100.0)
Platelets: 495 10*3/uL — ABNORMAL HIGH (ref 150–440)
RBC: 3.86 MIL/uL — ABNORMAL LOW (ref 4.40–5.90)
RDW: 14.6 % — AB (ref 11.5–14.5)
WBC: 14.9 10*3/uL — ABNORMAL HIGH (ref 3.8–10.6)

## 2017-04-03 MED ORDER — MUPIROCIN 2 % EX OINT
TOPICAL_OINTMENT | Freq: Every day | CUTANEOUS | Status: DC
  Administered 2017-04-03 – 2017-04-04 (×2): via NASAL
  Filled 2017-04-03: qty 22

## 2017-04-03 MED ORDER — POTASSIUM CHLORIDE CRYS ER 20 MEQ PO TBCR
40.0000 meq | EXTENDED_RELEASE_TABLET | Freq: Once | ORAL | Status: AC
  Administered 2017-04-03: 40 meq via ORAL
  Filled 2017-04-03: qty 2

## 2017-04-03 MED ORDER — VANCOMYCIN HCL IN DEXTROSE 1-5 GM/200ML-% IV SOLN
1000.0000 mg | Freq: Three times a day (TID) | INTRAVENOUS | Status: DC
  Administered 2017-04-03 – 2017-04-04 (×2): 1000 mg via INTRAVENOUS
  Filled 2017-04-03 (×4): qty 200

## 2017-04-03 MED ORDER — VANCOMYCIN HCL 10 G IV SOLR
1750.0000 mg | Freq: Once | INTRAVENOUS | Status: AC
  Administered 2017-04-03: 1750 mg via INTRAVENOUS
  Filled 2017-04-03: qty 1000

## 2017-04-03 NOTE — Progress Notes (Signed)
[facility] NAME: Ryan [name]    MR#:  [id number]  DATE OF BIRTH:  1950-05-22  SUBJECTIVE:  CHIEF COMPLAINT:   Chief Complaint  Patient presents with  . Numbness  Febrile REVIEW OF SYSTEMS:  Review of Systems  Constitutional: Positive for fever. Negative for chills and weight loss.  HENT: Negative for nosebleeds and sore throat.   Eyes: Negative for blurred vision.  Respiratory: Negative for cough, shortness of breath and wheezing.   Cardiovascular: Negative for chest pain, orthopnea, leg swelling and PND.  Gastrointestinal: Negative for abdominal pain, constipation, diarrhea, heartburn, nausea and vomiting.  Genitourinary: Negative for dysuria and urgency.  Musculoskeletal: Negative for back pain.  Skin: Negative for rash.  Neurological: Positive for sensory change. Negative for dizziness, speech change, focal weakness and headaches.  Endo/Heme/Allergies: Does not bruise/bleed easily.  Psychiatric/Behavioral: Negative for depression.    DRUG ALLERGIES:  No Known Allergies VITALS:  Blood pressure 120/74, pulse (!) 110, temperature (!) 97.2 F (36.2 C), temperature source Oral, resp. rate (!) 24, height 5\' 4"  (1.626 m), weight 98 kg (216 lb), SpO2 97 %. PHYSICAL EXAMINATION:  Physical Exam  Constitutional: He is oriented to person, place, and time and well-developed, well-nourished, and in no distress.  HENT:  Head: Normocephalic and atraumatic.  Eyes: Conjunctivae and EOM are normal. Pupils are equal, round, and reactive to light.  Neck: Normal range of motion. Neck supple. No tracheal deviation present. No thyromegaly present.  Cardiovascular: Normal rate, regular rhythm and normal heart sounds.  Pulmonary/Chest: Effort normal and breath sounds normal. No respiratory distress. He has no wheezes. He exhibits no tenderness.  Abdominal: Soft. Bowel sounds are normal. He exhibits no distension. There is no  tenderness.  Musculoskeletal: Normal range of motion.  Neurological: He is alert and oriented to person, place, and time. No cranial nerve deficit.  Skin: Skin is warm and dry. Rash noted.  Ext R arm amputee, L elbow with large wound over olecranon bursa  Skin: L olecranon region with large wound with purulent base    Psychiatric: Mood and affect normal.   LABORATORY PANEL:  Male CBC Recent Labs  Lab 04/03/17 0434  WBC 14.9*  HGB 10.8*  HCT 32.5*  PLT 495*   ------------------------------------------------------------------------------------------------------------------ Chemistries  Recent Labs  Lab 04/02/17 1759 04/03/17 0434  NA  --  134*  K  --  3.4*  CL  --  99*  CO2  --  26  GLUCOSE  --  91  BUN  --  9  CREATININE  --  0.77  CALCIUM  --  8.2*  AST 30  --   ALT 28  --   ALKPHOS 44  --   BILITOT 0.8  --    RADIOLOGY:  Dg Elbow 2 Views Left  Result Date: 04/03/2017 CLINICAL DATA: Left elbow wound. EXAM: LEFT ELBOW - 2 VIEW COMPARISON:  06/27/2010 . FINDINGS: Soft tissue swelling . No radiopaque foreign body . Peripheral vascular calcification. No acute bony abnormality. IMPRESSION: 1. No acute bony abnormality identified. No radiopaque foreign body. 2.  Peripheral vascular disease. Electronically Signed   By: Marcello Moores  Register   On: 04/03/2017 13:52   ASSESSMENT AND PLAN:  66 year old male patient with history of hepatitis C, chronic pain, hypertension, schizophrenia, peripheral neuropathy admitted with fever and low back pain.  1. Sepsis: Present on admission, due to L olecranon region with large wound with purulent  base -Appreciate ID input -Continue Rocephin, vancomycin till would c/s, ID following  2. Back pain/numbness in the lower extremities: Seem to be chronic, monitor, outpatient orthopedic evaluation 3. Hyponatremia/hypokalemia: Repleted and resolved 4. Atrial tachycardia Short run, asymptomatic - Continue beta-blocker Echocardiogram pending 5.  Dehydration: Continue IV hydration and monitor 6. hepatitis C: Chronic    Physical therapy evaluation -home health with physical therapy   All the records are reviewed and case discussed with Care Management/Social Worker. Management plans discussed with the patient, nursing and they are in agreement.  CODE STATUS: Full Code  TOTAL TIME TAKING CARE OF THIS PATIENT: 25 minutes.   More than 50% of the time was spent in counseling/coordination of care: YES  POSSIBLE D/C IN 1 DAYS, DEPENDING ON CLINICAL CONDITION.  And ID evaluation   Max Sane M.D on 04/03/2017 at 6:37 PM  Between 7am to 6pm - Pager - [phone]  After 6pm go to [email or web address] - Proofreader  [facility]  Office  [phone]  CC: Primary care physician; Kathrine Haddock, NP  Note: This dictation was prepared with Dragon dictation along with smaller phrase technology. Any transcriptional errors that result from this process are unintentional.

## 2017-04-03 NOTE — Progress Notes (Addendum)
ANTIBIOTIC CONSULT NOTE - INITIAL  Pharmacy Consult for Vancomycin and Ceftriaxone  Indication: sepsis  No Known Allergies  Patient Measurements: Height: 5\' 4"  (162.6 cm) Weight: 216 lb (98 kg) IBW/kg (Calculated) : 59.2 Adjusted Body Weight:   Vital Signs: BP: 97/69 (11/15 0900) Pulse Rate: 108 (11/15 0900) Intake/Output from previous day: 11/14 0701 - 11/15 0700 In: 1989 [P.O.:840; I.V.:1099; IV Piggyback:50] Out: 2275 [Urine:2275] Intake/Output from this shift: Total I/O In: -  Out: 550 [Urine:550]  Labs: Recent Labs    04/01/17 0438 04/02/17 0502 04/03/17 0434  WBC 13.5* 12.7* 14.9*  HGB 10.4* 10.9* 10.8*  PLT 471* 463* 495*  CREATININE 0.92 0.87 0.77   Estimated Creatinine Clearance: 98.6 mL/min (by C-G formula based on SCr of 0.77 mg/dL). No results for input(s): VANCOTROUGH, VANCOPEAK, VANCORANDOM, GENTTROUGH, GENTPEAK, GENTRANDOM, TOBRATROUGH, TOBRAPEAK, TOBRARND, AMIKACINPEAK, AMIKACINTROU, AMIKACIN in the last 72 hours.   Microbiology: Recent Results (from the past 720 hour(s))  Culture, blood (Routine x 2)     Status: None (Preliminary result)   Collection Time: 03/31/17  6:29 PM  Result Value Ref Range Status   Specimen Description BLOOD LEFT HAND  Final   Special Requests   Final    BOTTLES DRAWN AEROBIC AND ANAEROBIC Blood Culture adequate volume   Culture NO GROWTH 3 DAYS  Final   Report Status PENDING  Incomplete  Culture, blood (Routine x 2)     Status: None (Preliminary result)   Collection Time: 03/31/17  6:29 PM  Result Value Ref Range Status   Specimen Description BLOOD LAC  Final   Special Requests   Final    BOTTLES DRAWN AEROBIC AND ANAEROBIC Blood Culture adequate volume   Culture NO GROWTH 3 DAYS  Final   Report Status PENDING  Incomplete  Aerobic Culture (superficial specimen)     Status: None (Preliminary result)   Collection Time: 04/02/17  4:12 PM  Result Value Ref Range Status   Specimen Description WOUND LEFT ARM  Final    Special Requests Normal  Final   Gram Stain   Final    RARE WBC PRESENT, PREDOMINANTLY PMN NO ORGANISMS SEEN Performed at [facility], [address] [place], [place] [address]    Culture PENDING  Incomplete   Report Status PENDING  Incomplete    Medical History: Past Medical History:  Diagnosis Date  . Chronic pain   . Hepatitis C   . Hypertension   . Peripheral neuropathy   . Schizophrenia ([place])     Medications:  Medications Prior to Admission  Medication Sig Dispense Refill Last Dose  . aspirin 81 MG chewable tablet Chew 81 mg by mouth 2 (two) times daily.   03/30/2017 at 2145  . atenolol (TENORMIN) 25 MG tablet Take 1 tablet (25 mg total) by mouth daily. 30 tablet 12 Unknown at Unknown  . meloxicam (MOBIC) 15 MG tablet Take 15 mg by mouth daily.   Unknown at Unknown  . methocarbamol (ROBAXIN) 750 MG tablet Take 1 tablet (750 mg total) by mouth 4 (four) times daily. (Patient taking differently: Take 750 mg every 6 (six) hours as needed by mouth. ) 40 tablet 1 prn at prn  . Multiple Vitamin (MULTIVITAMIN) tablet Take 1 tablet by mouth daily.   Unknown at Unknown  . OLANZapine (ZYPREXA) 20 MG tablet Take 2 tablets (40 mg total) by mouth at bedtime. 60 tablet 0 03/30/2017 at Unknown  . pantoprazole (PROTONIX) 40 MG tablet TAKE 1 TABLET BY MOUTH EVERY DAY  30 tablet 0 Unknown at Unknown   Scheduled:  . aspirin  81 mg Oral BID  . atenolol  25 mg Oral Daily  . enoxaparin (LOVENOX) injection  40 mg Subcutaneous Q24H  . multivitamin with minerals  1 tablet Oral Daily  . mupirocin ointment   Nasal Daily  . OLANZapine  40 mg Oral QHS  . pantoprazole  40 mg Oral Daily  . potassium chloride  40 mEq Oral Once   Assessment: Pharmacy consulted to dose and monitor Vancomycin in this 66 year old male admitted with fever with infected abrasion over L olecranon bursa which suspected to be the source of the acute illness.   Goal of Therapy:  Vancomycin trough level 15-20  mcg/ml  Plan:  Will give Vancomycin 1750 mg IV x 1 dose and will then start Vancomycin 1 g IV q8 hours. Trough level prior to the 4th dose of the q8 hour regimen. Pharmacy to follow and adjust dosing based on renal function changes if deemed necessary.   Ceftriaxone: Will continue Ceftriaxone 1 g daily     Ashle Stief D 04/03/2017,11:11 AM

## 2017-04-03 NOTE — Consult Note (Signed)
[facility] Nurse wound consult note Reason for Consult: Trauma wound to left olecranon region.. This is a trauma wound.  He indicates he "bumped on something" a few weeks ago Wound type:trauma Pressure Injury POA: NA Measurement: 4 cm x 3.6 cm x 0.2 cm  Wound bed: pale pink nongranulating . Drainage (amount, consistency, odor) minimal serosanguinous  No odor Periwound:erythema and some crusted dried effluent Dressing procedure/placement/frequency:[place] wound to left elbow with NS.  Apply pea sized amount of mupirocin ointment to wound bed.  Cover with silicone foam.  Re apply daily.  Can peel back foam dressing to reapply daily.  Change foam every three days.  Will not follow at this time.  Please re-consult if needed.  Domenic Moras RN BSN [place] Pager [phone]

## 2017-04-03 NOTE — Progress Notes (Signed)
[facility] INFECTIOUS DISE[facility] PROGRESS NOTE Date of Admission:  03/31/2017     ID: Ryan [name] is a 66 y.o. male with sepsis Active Problems:   SIRS (systemic inflammatory response syndrome) (HCC)   Subjective: No fevers,   ROS  Eleven systems are reviewed and negative except per hpi  Medications:  Antibiotics Given (last 72 hours)    Date/Time Action Medication Dose Rate   03/31/17 2351 New Bag/Given   cefTRIAXone (ROCEPHIN) 2 g in dextrose 5 % 50 mL IVPB 2 g 100 mL/hr   04/01/17 1719 New Bag/Given   cefTRIAXone (ROCEPHIN) 2 g in dextrose 5 % 50 mL IVPB 2 g 100 mL/hr   04/02/17 1844 New Bag/Given   cefTRIAXone (ROCEPHIN) 2 g in dextrose 5 % 50 mL IVPB 2 g 100 mL/hr     . aspirin  81 mg Oral BID  . atenolol  25 mg Oral Daily  . enoxaparin (LOVENOX) injection  40 mg Subcutaneous Q24H  . multivitamin with minerals  1 tablet Oral Daily  . mupirocin ointment   Nasal Daily  . OLANZapine  40 mg Oral QHS  . pantoprazole  40 mg Oral Daily    Objective: Vital signs in last 24 hours: Temp:  [97.8 F (36.6 C)-97.9 F (36.6 C)] 97.8 F (36.6 C) (11/14 2015) Pulse Rate:  [82-108] 108 (11/15 0900) Resp:  [21] 21 (11/14 2015) BP: (97-120)/(69-90) 97/69 (11/15 0900) SpO2:  [95 %-96 %] 96 % (11/14 2015) Constitutional: He is alert but somewhat agitated.  HENT: anicteric Mouth/Throat: Oropharynx is clear and moist. No oropharyngeal exudate.  Cardiovascular: Normal rate, regular rhythm and normal heart sounds. Pulmonary/Chest: Effort normal and breath sounds normal. No respiratory distress. He has no wheezes.  Abdominal: Soft. Bowel sounds are normal. He exhibits no distension. There is no tenderness.  Lymphadenopathy: He has no cervical adenopathy.  Neurological: He is alert and oriented to person, place, and time.  Ext R arm amputee, L elbow with large wound over olecranon bursa, good rom though  Skin: L olecranon region with large wound with purulent base Psychiatric: He  is agitated and insisting on getting oob    Lab Results Recent Labs    04/02/17 0502 04/03/17 0434  WBC 12.7* 14.9*  HGB 10.9* 10.8*  HCT 33.0* 32.5*  NA 134* 134*  K 3.6 3.4*  CL 103 99*  CO2 23 26  BUN 11 9  CREATININE 0.87 0.77    Microbiology: Results for orders placed or performed during the hospital encounter of 03/31/17  Culture, blood (Routine x 2)     Status: None (Preliminary result)   Collection Time: 03/31/17  6:29 PM  Result Value Ref Range Status   Specimen Description BLOOD LEFT HAND  Final   Special Requests   Final    BOTTLES DRAWN AEROBIC AND ANAEROBIC Blood Culture adequate volume   Culture NO GROWTH 3 DAYS  Final   Report Status PENDING  Incomplete  Culture, blood (Routine x 2)     Status: None (Preliminary result)   Collection Time: 03/31/17  6:29 PM  Result Value Ref Range Status   Specimen Description BLOOD LAC  Final   Special Requests   Final    BOTTLES DRAWN AEROBIC AND ANAEROBIC Blood Culture adequate volume   Culture NO GROWTH 3 DAYS  Final   Report Status PENDING  Incomplete  Aerobic Culture (superficial specimen)     Status: None (Preliminary result)   Collection Time: 04/02/17  4:12 PM  Result  Value Ref Range Status   Specimen Description WOUND LEFT ARM  Final   Special Requests Normal  Final   Gram Stain   Final    RARE WBC PRESENT, PREDOMINANTLY PMN NO ORGANISMS SEEN Performed at [facility], [address] [place], [place] [address]    Culture PENDING  Incomplete   Report Status PENDING  Incomplete    Studies/Results: No results found.  Assessment/Plan: Tandre Conly is a 66 y.o. male admitted with fevers x 1 day, back pain acute on chronic, progressive LE weakness.  He had temp 101.6 and wbc 17. CXR neg, T and L spine MRI neg for infection, BCX pending, UA neg. LFTs nml, CRP 13.1. ESR 114 HIV and RPR negative He has an infected abrasion over L olecranon bursa which I suspect is the source and have cultured.    Recommendations Cont vanco.  Cont ceftriaxone pending cultures of wound and BCX  Wound care consult   Thank you very much for the consult. Will follow with you.  Leonel Ramsay   04/03/2017, 12:19 PM
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
## 2017-04-04 ENCOUNTER — Inpatient Hospital Stay: Admit: 2017-04-04 | Payer: Medicare Other

## 2017-04-04 LAB — CBC
HEMATOCRIT: 32.2 % — AB (ref 40.0–52.0)
Hemoglobin: 10.6 g/dL — ABNORMAL LOW (ref 13.0–18.0)
MCH: 27.6 pg (ref 26.0–34.0)
MCHC: 32.9 g/dL (ref 32.0–36.0)
MCV: 84 fL (ref 80.0–100.0)
PLATELETS: 492 10*3/uL — AB (ref 150–440)
RBC: 3.83 MIL/uL — ABNORMAL LOW (ref 4.40–5.90)
RDW: 14.7 % — AB (ref 11.5–14.5)
WBC: 12.6 10*3/uL — ABNORMAL HIGH (ref 3.8–10.6)

## 2017-04-04 LAB — BASIC METABOLIC PANEL
Anion gap: 10 (ref 5–15)
BUN: 9 mg/dL (ref 6–20)
CALCIUM: 8.3 mg/dL — AB (ref 8.9–10.3)
CO2: 25 mmol/L (ref 22–32)
CREATININE: 0.74 mg/dL (ref 0.61–1.24)
Chloride: 101 mmol/L (ref 101–111)
GFR calc non Af Amer: >60 mL/min (ref >60)
GLUCOSE: 105 mg/dL — AB (ref 65–99)
Potassium: 3.4 mmol/L — ABNORMAL LOW (ref 3.5–5.1)
Sodium: 136 mmol/L (ref 135–145)

## 2017-04-04 MED ORDER — AMOXICILLIN-POT CLAVULANATE 875-125 MG PO TABS: 1.0000 | ORAL_TABLET | Freq: Two times a day (BID) | ORAL | 0 refills | Status: AC

## 2017-04-04 MED ORDER — DOXYCYCLINE HYCLATE 50 MG PO CAPS: 50.0000 mg | ORAL_CAPSULE | Freq: Two times a day (BID) | ORAL | 0 refills | Status: DC

## 2017-04-04 NOTE — Progress Notes (Signed)
[name] to be D/C'd Home per MD order.  Discussed prescriptions and follow up appointments with the patient. Prescriptions given to patient, medication list explained in detail. Pt verbalized understanding.  Allergies as of 04/04/2017   No Known Allergies     Medication List    TAKE these medications   amoxicillin-clavulanate 875-125 MG tablet Commonly known as:  AUGMENTIN Take 1 tablet every 12 (twelve) hours for 7 days by mouth.   aspirin 81 MG chewable tablet Chew 81 mg by mouth 2 (two) times daily.   atenolol 25 MG tablet Commonly known as:  TENORMIN Take 1 tablet (25 mg total) by mouth daily.   doxycycline 50 MG capsule Commonly known as:  VIBRAMYCIN Take 1 capsule (50 mg total) 2 (two) times daily by mouth.   meloxicam 15 MG tablet Commonly known as:  MOBIC Take 15 mg by mouth daily.   methocarbamol 750 MG tablet Commonly known as:  ROBAXIN Take 1 tablet (750 mg total) by mouth 4 (four) times daily. What changed:    when to take this  reasons to take this   multivitamin tablet Take 1 tablet by mouth daily.   OLANZapine 20 MG tablet Commonly known as:  ZYPREXA Take 2 tablets (40 mg total) by mouth at bedtime.   pantoprazole 40 MG tablet Commonly known as:  PROTONIX TAKE 1 TABLET BY MOUTH EVERY DAY       Vitals:   04/04/17 0504 04/04/17 0930  BP: 92/71 (!) 105/55  Pulse: 98 (!) 104  Resp: (!) 24   Temp: 97.7 F (36.5 C)   SpO2: 93%     Skin clean, dry and intact without evidence of skin break down, no evidence of skin tears noted. IV catheter discontinued intact. Site without signs and symptoms of complications. Dressing and pressure applied. Pt denies pain at this time. No complaints noted.  An After Visit Summary was printed and given to the patient. Patient escorted via [place], and D/C home via private auto.  Sharalyn Ink

## 2017-04-04 NOTE — Care Management Important Message (Signed)
Important Message  Patient Details  Name: Ryan [name] MRN: [id number] Date of Birth: [birth year]   Medicare Important Message Given:  Yes    Beverly Sessions, RN 04/04/2017, 10:07 AM

## 2017-04-04 NOTE — Care Management Note (Signed)
Case Management Note  Patient Details  Name: Brit Wernette MRN: [id number] Date of Birth: 1950/07/02   Patient discharging today. Lives at home alone.  PCP Julian Hy.  Friend "Laurey Arrow" provides transportation when needed.  Patient has a cane and Rw in the home.  PT assessed patient and recommends home health PT. Patient in agreement.  Patient does not have a preference of home health agency.  Referral made to [facility] with [facility].  PT recomm elevated toliet seat.  Patient declined.  RNCM signing off.   Subjective/Objective:                    Action/Plan:   Expected Discharge Date:  04/04/17               Expected Discharge Plan:  [place]  In-House Referral:     Discharge planning Services  CM Consult  Post Acute Care Choice:  Home Health Choice offered to:     DME Arranged:    DME Agency:     HH Arranged:  RN, PT, Nurse's Aide, Social Work [employer] Agency:  [employer]  Status of Service:  Completed, signed off  If discussed at [employer], dates discussed:    Additional Comments:  Beverly Sessions, RN 04/04/2017, 1:56 PM

## 2017-04-05 LAB — AEROBIC CULTURE  (SUPERFICIAL SPECIMEN): SPECIAL REQUESTS: NORMAL

## 2017-04-05 LAB — AEROBIC CULTURE W GRAM STAIN (SUPERFICIAL SPECIMEN)

## 2017-04-05 LAB — CULTURE, BLOOD (ROUTINE X 2)
CULTURE: NO GROWTH
Culture: NO GROWTH
SPECIAL REQUESTS: ADEQUATE
Special Requests: ADEQUATE
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
## 2017-04-11 ENCOUNTER — Inpatient Hospital Stay: Payer: Medicare Other | Admitting: Unknown Physician Specialty

## 2017-04-11 ENCOUNTER — Other Ambulatory Visit: Payer: Self-pay | Admitting: Unknown Physician Specialty

## 2017-04-11 DIAGNOSIS — R29898 Other symptoms and signs involving the musculoskeletal system: Secondary | ICD-10-CM

## 2017-04-11 DIAGNOSIS — R296 Repeated falls: Secondary | ICD-10-CM

## 2017-04-24 ENCOUNTER — Telehealth: Payer: Self-pay | Admitting: Unknown Physician Specialty

## 2017-04-24 NOTE — Telephone Encounter (Signed)
Copied from [place]. Topic: General - Other >> Apr 24, 2017 10:04 AM Darl Householder, RMA wrote: Reason for CRM: Hoyle Sauer from [facility] home health calling to request a prescription for Rolator walker please return call to carolyn at [phone]

## 2017-04-24 NOTE — Telephone Encounter (Signed)
Called and spoke to Geraldine. She gave me the fax number of [phone], to fax the prescription to. I told her that the patient's provider was not in today but would be back tomorrow so it will be sent in. Hoyle Sauer stated that was fine.

## 2017-04-25 NOTE — Telephone Encounter (Signed)
Order faxed to [facility].

## 2017-07-08 ENCOUNTER — Telehealth: Payer: Self-pay

## 2017-07-08 NOTE — Telephone Encounter (Signed)
Copied from [place]. Topic: General - Other >> Jul 08, 2017 11:11 AM Carolyn Stare wrote:  Ebbie Latus with [facility] call to report pt has a blister between his 4th and 5 toe on left foot . Red but his afebrile and denies pain, pt left forearm fell on it and has large bruise   [phone]      Routing to provider.

## 2017-09-08 ENCOUNTER — Ambulatory Visit (INDEPENDENT_AMBULATORY_CARE_PROVIDER_SITE_OTHER): Payer: Medicare Other

## 2017-09-08 VITALS — BP 118/68 | HR 70 | Temp 98.3°F | Resp 17 | Ht 61.5 in | Wt 205.0 lb

## 2017-09-08 DIAGNOSIS — Z0000 Encounter for general adult medical examination without abnormal findings: Secondary | ICD-10-CM | POA: Diagnosis not present

## 2017-09-08 NOTE — Patient Instructions (Signed)
Mr. Ryan [name] , Thank you for taking time to come for your Medicare Wellness Visit. I appreciate your ongoing commitment to your health goals. Please review the following plan we discussed and let me know if I can assist you in the future.   Screening recommendations/referrals: Colonoscopy: completed  Recommended yearly ophthalmology/optometry visit for glaucoma screening and checkup Recommended yearly dental visit for hygiene and checkup  Vaccinations: Influenza vaccine: up to date Pneumococcal vaccine: declined Tdap vaccine: up to date Shingles vaccine: declined     Advanced directives: Please bring a copy of your health care power of attorney and living will to the office at your convenience.  Conditions/risks identified: Recommend continue drinking at least 6-8 glasses of water a day   Next appointment: Follow up in one year for your annual wellness exam.   Preventive Care 65 Years and Older, Male Preventive care refers to lifestyle choices and visits with your health care provider that can promote health and wellness. What does preventive care include?  A yearly physical exam. This is also called an annual well check.  Dental exams once or twice a year.  Routine eye exams. Ask your health care provider how often you should have your eyes checked.  Personal lifestyle choices, including:  Daily care of your teeth and gums.  Regular physical activity.  Eating a healthy diet.  Avoiding tobacco and drug use.  Limiting alcohol use.  Practicing safe sex.  Taking low doses of aspirin every day.  Taking vitamin and mineral supplements as recommended by your health care provider. What happens during an annual well check? The services and screenings done by your health care provider during your annual well check will depend on your age, overall health, lifestyle risk factors, and family history of disease. Counseling  Your health care provider may ask you questions about  your:  Alcohol use.  Tobacco use.  Drug use.  Emotional well-being.  Home and relationship well-being.  Sexual activity.  Eating habits.  History of falls.  Memory and ability to understand (cognition).  Work and work Statistician. Screening  You may have the following tests or measurements:  Height, weight, and BMI.  Blood pressure.  Lipid and cholesterol levels. These may be checked every 5 years, or more frequently if you are over 47 years old.  Skin check.  Lung cancer screening. You may have this screening every year starting at age 33 if you have a 30-pack-year history of smoking and currently smoke or have quit within the past 15 years.  Fecal occult blood test (FOBT) of the stool. You may have this test every year starting at age 79.  Flexible sigmoidoscopy or colonoscopy. You may have a sigmoidoscopy every 5 years or a colonoscopy every 10 years starting at age 34.  Prostate cancer screening. Recommendations will vary depending on your family history and other risks.  Hepatitis C blood test.  Hepatitis B blood test.  Sexually transmitted disease (STD) testing.  Diabetes screening. This is done by checking your blood sugar (glucose) after you have not eaten for a while (fasting). You may have this done every 1-3 years.  Abdominal aortic aneurysm (AAA) screening. You may need this if you are a current or former smoker.  Osteoporosis. You may be screened starting at age 68 if you are at high risk. Talk with your health care provider about your test results, treatment options, and if necessary, the need for more tests. Vaccines  Your health care provider may recommend certain vaccines,  such as:  Influenza vaccine. This is recommended every year.  Tetanus, diphtheria, and acellular pertussis (Tdap, Td) vaccine. You may need a Td booster every 10 years.  Zoster vaccine. You may need this after age 63.  Pneumococcal 13-valent conjugate (PCV13) vaccine.  One dose is recommended after age 25.  Pneumococcal polysaccharide (PPSV23) vaccine. One dose is recommended after age 49. Talk to your health care provider about which screenings and vaccines you need and how often you need them. This information is not intended to replace advice given to you by your health care provider. Make sure you discuss any questions you have with your health care provider. Document Released: 06/02/2015 Document Revised: 01/24/2016 Document Reviewed: 03/07/2015 Elsevier Interactive Patient Education  2017 [place] Prevention in the Home Falls can cause injuries. They can happen to people of all ages. There are many things you can do to make your home safe and to help prevent falls. What can I do on the outside of my home?  Regularly fix the edges of walkways and driveways and fix any cracks.  Remove anything that might make you trip as you walk through a door, such as a raised step or threshold.  Trim any bushes or trees on the path to your home.  Use bright outdoor lighting.  Clear any walking paths of anything that might make someone trip, such as rocks or tools.  Regularly check to see if handrails are loose or broken. Make sure that both sides of any steps have handrails.  Any raised decks and porches should have guardrails on the edges.  Have any leaves, snow, or ice cleared regularly.  Use sand or salt on walking paths during winter.  Clean up any spills in your garage right away. This includes oil or grease spills. What can I do in the bathroom?  Use night lights.  Install grab bars by the toilet and in the tub and shower. Do not use towel bars as grab bars.  Use non-skid mats or decals in the tub or shower.  If you need to sit down in the shower, use a plastic, non-slip stool.  Keep the floor dry. Clean up any water that spills on the floor as soon as it happens.  Remove soap buildup in the tub or shower regularly.  Attach bath  mats securely with double-sided non-slip rug tape.  Do not have throw rugs and other things on the floor that can make you trip. What can I do in the bedroom?  Use night lights.  Make sure that you have a light by your bed that is easy to reach.  Do not use any sheets or blankets that are too big for your bed. They should not hang down onto the floor.  Have a firm chair that has side arms. You can use this for support while you get dressed.  Do not have throw rugs and other things on the floor that can make you trip. What can I do in the kitchen?  Clean up any spills right away.  Avoid walking on wet floors.  Keep items that you use a lot in easy-to-reach places.  If you need to reach something above you, use a strong step stool that has a grab bar.  Keep electrical cords out of the way.  Do not use floor polish or wax that makes floors slippery. If you must use wax, use non-skid floor wax.  Do not have throw rugs and other things on  the floor that can make you trip. What can I do with my stairs?  Do not leave any items on the stairs.  Make sure that there are handrails on both sides of the stairs and use them. Fix handrails that are broken or loose. Make sure that handrails are as long as the stairways.  Check any carpeting to make sure that it is firmly attached to the stairs. Fix any carpet that is loose or worn.  Avoid having throw rugs at the top or bottom of the stairs. If you do have throw rugs, attach them to the floor with carpet tape.  Make sure that you have a light switch at the top of the stairs and the bottom of the stairs. If you do not have them, ask someone to add them for you. What else can I do to help prevent falls?  Wear shoes that:  Do not have high heels.  Have rubber bottoms.  Are comfortable and fit you well.  Are closed at the toe. Do not wear sandals.  If you use a stepladder:  Make sure that it is fully opened. Do not climb a closed  stepladder.  Make sure that both sides of the stepladder are locked into place.  Ask someone to hold it for you, if possible.  Clearly mark and make sure that you can see:  Any grab bars or handrails.  First and last steps.  Where the edge of each step is.  Use tools that help you move around (mobility aids) if they are needed. These include:  Canes.  Walkers.  Scooters.  Crutches.  Turn on the lights when you go into a dark area. Replace any light bulbs as soon as they burn out.  Set up your furniture so you have a clear path. Avoid moving your furniture around.  If any of your floors are uneven, fix them.  If there are any pets around you, be aware of where they are.  Review your medicines with your doctor. Some medicines can make you feel dizzy. This can increase your chance of falling. Ask your doctor what other things that you can do to help prevent falls. This information is not intended to replace advice given to you by your health care provider. Make sure you discuss any questions you have with your health care provider. Document Released: 03/02/2009 Document Revised: 10/12/2015 Document Reviewed: 06/10/2014 Elsevier Interactive Patient Education  2017 [employer].
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
## 2017-09-16 ENCOUNTER — Ambulatory Visit (INDEPENDENT_AMBULATORY_CARE_PROVIDER_SITE_OTHER): Payer: Medicare Other | Admitting: Unknown Physician Specialty

## 2017-09-16 ENCOUNTER — Encounter: Payer: Self-pay | Admitting: Unknown Physician Specialty

## 2017-09-16 VITALS — BP 130/85 | HR 64 | Temp 98.7°F | Ht 61.5 in | Wt 204.6 lb

## 2017-09-16 DIAGNOSIS — Z5181 Encounter for therapeutic drug level monitoring: Secondary | ICD-10-CM | POA: Diagnosis not present

## 2017-09-16 DIAGNOSIS — E876 Hypokalemia: Secondary | ICD-10-CM | POA: Diagnosis not present

## 2017-09-16 DIAGNOSIS — D649 Anemia, unspecified: Secondary | ICD-10-CM | POA: Diagnosis not present

## 2017-09-16 DIAGNOSIS — D72829 Elevated white blood cell count, unspecified: Secondary | ICD-10-CM

## 2017-09-16 NOTE — Progress Notes (Signed)
BP 130/85 (BP Location: Left Arm, Cuff Size: Normal)   Pulse 64   Temp 98.7 F (37.1 C) (Oral)   Ht 5' 1.5" (1.562 m)   Wt 204 lb 9.6 oz (92.8 kg)   SpO2 94%   BMI 38.03 kg/m    Subjective:    Patient ID: Ryan [name], male    DOB: [birth year], 67 y.o.   MRN: [id number]  HPI: Ryan [name] is a 67 y.o. male  Chief Complaint  Patient presents with  . Hypertension    6 month f/up   Pt was hospitalized on 11/16 due to sepsis.  He was getting home health but no longer getting it.  Last time I saw him he was complaining of frequent falls but pt states he is now exercising and lost weight.  He does complain of being sleepy and thinks it is due to his Zyprexa.    Last time he had labs done 03/2017, it showed a low H/H, mildly elevated WBC and milldy low potassium,.    Relevant past medical, surgical, family and social history reviewed and updated as indicated. Interim medical history since our last visit reviewed. Allergies and medications reviewed and updated.  Review of Systems  Constitutional: Negative.   HENT: Negative.   Respiratory: Negative.   Cardiovascular: Negative.   Gastrointestinal: Negative.   Genitourinary: Negative.   Musculoskeletal: Negative.   Psychiatric/Behavioral: Negative.     Per HPI unless specifically indicated above     Objective:    BP 130/85 (BP Location: Left Arm, Cuff Size: Normal)   Pulse 64   Temp 98.7 F (37.1 C) (Oral)   Ht 5' 1.5" (1.562 m)   Wt 204 lb 9.6 oz (92.8 kg)   SpO2 94%   BMI 38.03 kg/m   Wt Readings from Last 3 Encounters:  09/16/17 204 lb 9.6 oz (92.8 kg)  09/08/17 205 lb (93 kg)  03/31/17 216 lb (98 kg)    Physical Exam  Constitutional: He is oriented to person, place, and time. He appears well-developed and well-nourished. No distress.  HENT:  Head: Normocephalic and atraumatic.  Eyes: Conjunctivae and lids are normal. Right eye exhibits no discharge. Left eye exhibits no discharge. No scleral icterus.    Neck: Normal range of motion. Neck supple. No JVD present. Carotid bruit is not present.  Cardiovascular: Normal rate, regular rhythm and normal heart sounds.  Pulmonary/Chest: Effort normal and breath sounds normal. No respiratory distress.  Abdominal: Normal appearance. There is no splenomegaly or hepatomegaly.  Musculoskeletal:  Missing right arm  Neurological: He is alert and oriented to person, place, and time.  Skin: Skin is warm, dry and intact. No rash noted. No pallor.  Psychiatric: He has a normal mood and affect. His behavior is normal. Judgment and thought content normal.      Assessment & Plan:   Problem List Items Addressed This Visit    None    Visit Diagnoses    Leukocytosis, unspecified type    -  Primary   Relevant Orders   CBC with Differential/Platelet   Anemia, unspecified type       Relevant Orders   CBC with Differential/Platelet   Hypokalemia       Relevant Orders   Comprehensive metabolic panel   Medication monitoring encounter       Relevant Orders   Comprehensive metabolic panel       Follow up plan: Return in about 6 months (around 03/18/2018).

## 2017-09-17 ENCOUNTER — Encounter: Payer: Self-pay | Admitting: Unknown Physician Specialty

## 2017-09-17 LAB — CBC WITH DIFFERENTIAL/PLATELET
BASOS ABS: 0 10*3/uL (ref 0.0–0.2)
Basos: 0 %
EOS (ABSOLUTE): 0.2 10*3/uL (ref 0.0–0.4)
EOS: 2 %
HEMATOCRIT: 43.9 % (ref 37.5–51.0)
Hemoglobin: 14.8 g/dL (ref 13.0–17.7)
IMMATURE GRANULOCYTES: 0 %
Immature Grans (Abs): 0 10*3/uL (ref 0.0–0.1)
LYMPHS ABS: 1.4 10*3/uL (ref 0.7–3.1)
Lymphs: 20 %
MCH: 29.6 pg (ref 26.6–33.0)
MCHC: 33.7 g/dL (ref 31.5–35.7)
MCV: 88 fL (ref 79–97)
MONOS ABS: 0.8 10*3/uL (ref 0.1–0.9)
Monocytes: 11 %
NEUTROS PCT: 67 %
Neutrophils Absolute: 4.7 10*3/uL (ref 1.4–7.0)
PLATELETS: 328 10*3/uL (ref 150–379)
RBC: 5 x10E6/uL (ref 4.14–5.80)
RDW: 15 % (ref 12.3–15.4)
WBC: 7.1 10*3/uL (ref 3.4–10.8)

## 2017-09-17 LAB — COMPREHENSIVE METABOLIC PANEL
ALK PHOS: 36 IU/L — AB (ref 39–117)
ALT: 48 IU/L — ABNORMAL HIGH (ref 0–44)
AST: 33 IU/L (ref 0–40)
Albumin/Globulin Ratio: 1.8 (ref 1.2–2.2)
Albumin: 4.5 g/dL (ref 3.6–4.8)
BILIRUBIN TOTAL: 0.5 mg/dL (ref 0.0–1.2)
BUN / CREAT RATIO: 14 (ref 10–24)
BUN: 14 mg/dL (ref 8–27)
CHLORIDE: 103 mmol/L (ref 96–106)
CO2: 26 mmol/L (ref 20–29)
CREATININE: 0.99 mg/dL (ref 0.76–1.27)
Calcium: 9.9 mg/dL (ref 8.6–10.2)
GFR calc Af Amer: 92 mL/min/{1.73_m2} (ref >59)
GFR calc non Af Amer: 80 mL/min/{1.73_m2} (ref >59)
GLOBULIN, TOTAL: 2.5 g/dL (ref 1.5–4.5)
GLUCOSE: 88 mg/dL (ref 65–99)
Potassium: 4.9 mmol/L (ref 3.5–5.2)
SODIUM: 143 mmol/L (ref 134–144)
Total Protein: 7 g/dL (ref 6.0–8.5)

## 2017-11-21 ENCOUNTER — Ambulatory Visit: Payer: Self-pay | Admitting: *Deleted
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
## 2017-11-23 ENCOUNTER — Other Ambulatory Visit: Payer: Self-pay | Admitting: Unknown Physician Specialty

## 2017-11-24 ENCOUNTER — Ambulatory Visit (INDEPENDENT_AMBULATORY_CARE_PROVIDER_SITE_OTHER): Payer: Medicare Other | Admitting: Unknown Physician Specialty

## 2017-11-24 ENCOUNTER — Encounter: Payer: Self-pay | Admitting: Unknown Physician Specialty

## 2017-11-24 DIAGNOSIS — M4800 Spinal stenosis, site unspecified: Secondary | ICD-10-CM | POA: Insufficient documentation

## 2017-11-24 DIAGNOSIS — M4807 Spinal stenosis, lumbosacral region: Secondary | ICD-10-CM | POA: Diagnosis not present

## 2017-11-24 MED ORDER — GABAPENTIN 100 MG PO CAPS: 100.0000 mg | ORAL_CAPSULE | Freq: Three times a day (TID) | ORAL | 3 refills | Status: DC

## 2017-11-24 NOTE — Assessment & Plan Note (Addendum)
Per MRI and consistent with symproms.  Recommend PT and will rx Gabapentin 100 mg up to TID.  Recheck in 4 weeks for dose adjustment

## 2017-11-24 NOTE — Progress Notes (Signed)
BP 135/84   Pulse 71   Temp 98 F (36.7 C) (Oral)   Ht 5' 1.5" (1.562 m)   Wt 215 lb 3.2 oz (97.6 kg)   SpO2 95%   BMI 40.00 kg/m    Subjective:    Patient ID: Ryan [name], male    DOB: 1950/06/25, 67 y.o.   MRN: [id number]  HPI: Ryan [name] is a 67 y.o. male  Chief Complaint  Patient presents with  . Back Pain    pt states he has had back pain for 8 years. States he has been using a heating pad and taking tylenol, had some relief but not much    Back Pain  This is a chronic problem. The problem is unchanged. The pain is present in the lumbar spine. The quality of the pain is described as aching. The pain does not radiate. The pain is severe. Exacerbated by: Unable to identify but hurts with a deep breath. Associated symptoms include numbness, paresthesias, perianal numbness and tingling. Pertinent negatives include no abdominal pain, bladder incontinence, bowel incontinence, chest pain, dysuria, fever, headaches, leg pain, paresis, pelvic pain, weakness or weight loss. (Bilateral foot numbness) Risk factors include lack of exercise and obesity. He has tried heat and NSAIDs for the symptoms. The treatment provided mild relief.   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 No lumbar spinal canal stenosis  Relevant past medical, surgical, family and social history reviewed and updated as indicated. Interim medical history since our last visit reviewed. Allergies and medications reviewed and updated.  Review of Systems  Constitutional: Negative for fever and weight loss.  Cardiovascular: Negative for chest pain.  Gastrointestinal: Negative for abdominal pain and bowel incontinence.  Genitourinary: Negative for bladder incontinence, dysuria and pelvic pain.  Musculoskeletal: Positive for back pain.  Neurological: Positive for tingling, numbness and paresthesias.  Negative for weakness and headaches.    Per HPI unless specifically indicated above     Objective:    BP 135/84   Pulse 71   Temp 98 F (36.7 C) (Oral)   Ht 5' 1.5" (1.562 m)   Wt 215 lb 3.2 oz (97.6 kg)   SpO2 95%   BMI 40.00 kg/m   Wt Readings from Last 3 Encounters:  11/24/17 215 lb 3.2 oz (97.6 kg)  09/16/17 204 lb 9.6 oz (92.8 kg)  09/08/17 205 lb (93 kg)    Physical Exam  Constitutional: He is oriented to person, place, and time. He appears well-developed and well-nourished. No distress.  HENT:  Head: Normocephalic and atraumatic.  Eyes: Conjunctivae and lids are normal. Right eye exhibits no discharge. Left eye exhibits no discharge. No scleral icterus.  Cardiovascular: Normal rate.  Pulmonary/Chest: Effort normal.  Abdominal: Normal appearance. There is no splenomegaly or hepatomegaly.  Musculoskeletal:       Lumbar back: He exhibits decreased range of motion and pain. He exhibits no tenderness, no bony tenderness, no swelling, no edema and no deformity.  Neurological: He is alert and oriented to person, place, and time.  Skin: Skin is intact. No rash noted. No pallor.  Psychiatric: He has a normal mood and affect. His behavior is normal. Judgment and thought content normal.    Results for orders placed or performed in visit on 09/16/17  Comprehensive metabolic panel  Result Value Ref Range   Glucose 88 65 - 99 mg/dL   BUN 14 8 - 27 mg/dL  Creatinine, Ser 0.99 0.76 - 1.27 mg/dL   GFR calc non Af Amer 80 >59 mL/min/1.73   GFR calc Af Amer 92 >59 mL/min/1.73   BUN/Creatinine Ratio 14 10 - 24   Sodium 143 134 - 144 mmol/L   Potassium 4.9 3.5 - 5.2 mmol/L   Chloride 103 96 - 106 mmol/L   CO2 26 20 - 29 mmol/L   Calcium 9.9 8.6 - 10.2 mg/dL   Total Protein 7.0 6.0 - 8.5 g/dL   Albumin 4.5 3.6 - 4.8 g/dL   Globulin, Total 2.5 1.5 - 4.5 g/dL   Albumin/Globulin Ratio 1.8 1.2 - 2.2   Bilirubin Total 0.5 0.0 - 1.2 mg/dL   Alkaline Phosphatase 36 (L) 39 - 117  IU/L   AST 33 0 - 40 IU/L   ALT 48 (H) 0 - 44 IU/L  CBC with Differential/Platelet  Result Value Ref Range   WBC 7.1 3.4 - 10.8 x10E3/uL   RBC 5.00 4.14 - 5.80 x10E6/uL   Hemoglobin 14.8 13.0 - 17.7 g/dL   Hematocrit 43.9 37.5 - 51.0 %   MCV 88 79 - 97 fL   MCH 29.6 26.6 - 33.0 pg   MCHC 33.7 31.5 - 35.7 g/dL   RDW 15.0 12.3 - 15.4 %   Platelets 328 150 - 379 x10E3/uL   Neutrophils 67 Not Estab. %   Lymphs 20 Not Estab. %   Monocytes 11 Not Estab. %   Eos 2 Not Estab. %   Basos 0 Not Estab. %   Neutrophils Absolute 4.7 1.4 - 7.0 x10E3/uL   Lymphocytes Absolute 1.4 0.7 - 3.1 x10E3/uL   Monocytes Absolute 0.8 0.1 - 0.9 x10E3/uL   EOS (ABSOLUTE) 0.2 0.0 - 0.4 x10E3/uL   Basophils Absolute 0.0 0.0 - 0.2 x10E3/uL   Immature Granulocytes 0 Not Estab. %   Immature Grans (Abs) 0.0 0.0 - 0.1 x10E3/uL      Assessment & Plan:   Problem List Items Addressed This Visit      Unprioritized   Spinal stenosis    Per MRI and consistent with symproms.  Recommend PT and will rx Gabapentin 100 mg up to TID.  Recheck in 4 weeks for dose adjustment      Relevant Orders   Ambulatory referral to [facility]       Follow up plan: Return in about 1 month (around 12/22/2017).

## 2017-12-16 ENCOUNTER — Other Ambulatory Visit: Payer: Self-pay | Admitting: Unknown Physician Specialty

## 2017-12-17 NOTE — Telephone Encounter (Signed)
gabapentin refill Last Refill:11/24/17 # 90 caps 3 RF Last OV: 11/24/17 PCP: Kathrine Haddock NP Pharmacy:[facility] [address] Pt wants a 90 days prescription

## 2018-03-18 ENCOUNTER — Other Ambulatory Visit: Payer: Self-pay

## 2018-03-18 ENCOUNTER — Encounter: Payer: Self-pay | Admitting: Nurse Practitioner

## 2018-03-18 ENCOUNTER — Ambulatory Visit (INDEPENDENT_AMBULATORY_CARE_PROVIDER_SITE_OTHER): Payer: Medicare Other | Admitting: Nurse Practitioner

## 2018-03-18 ENCOUNTER — Ambulatory Visit: Payer: Self-pay | Admitting: Unknown Physician Specialty

## 2018-03-18 DIAGNOSIS — I129 Hypertensive chronic kidney disease with stage 1 through stage 4 chronic kidney disease, or unspecified chronic kidney disease: Secondary | ICD-10-CM | POA: Diagnosis not present

## 2018-03-18 DIAGNOSIS — D649 Anemia, unspecified: Secondary | ICD-10-CM

## 2018-03-18 DIAGNOSIS — F209 Schizophrenia, unspecified: Secondary | ICD-10-CM

## 2018-03-18 MED ORDER — MELOXICAM 15 MG PO TABS: 15.0000 mg | ORAL_TABLET | Freq: Every day | ORAL | 2 refills | Status: DC

## 2018-03-18 MED ORDER — ATENOLOL 25 MG PO TABS: 25.0000 mg | ORAL_TABLET | Freq: Every day | ORAL | 3 refills | Status: DC

## 2018-03-18 MED ORDER — GABAPENTIN 100 MG PO CAPS: ORAL_CAPSULE | ORAL | 2 refills | Status: DC

## 2018-03-18 NOTE — Assessment & Plan Note (Addendum)
Chronic.  On review of chart baseline HGB in range.  Will check CBC and anemia panel today.

## 2018-03-18 NOTE — Patient Instructions (Signed)
Anemia Anemia is a condition in which you do not have enough red blood cells or hemoglobin. Hemoglobin is a substance in red blood cells that carries oxygen. When you do not have enough red blood cells or hemoglobin (are anemic), your body cannot get enough oxygen and your organs may not work properly. As a result, you may feel very tired or have other problems. What are the causes? Common causes of anemia include:  Excessive bleeding. Anemia can be caused by excessive bleeding inside or outside the body, including bleeding from the intestine or from periods in women.  Poor nutrition.  Long-lasting (chronic) kidney, thyroid, and liver disease.  Bone marrow disorders.  Cancer and treatments for cancer.  HIV (human immunodeficiency virus) and AIDS (acquired immunodeficiency syndrome).  Treatments for HIV and AIDS.  Spleen problems.  Blood disorders.  Infections, medicines, and autoimmune disorders that destroy red blood cells.  What are the signs or symptoms? Symptoms of this condition include:  Minor weakness.  Dizziness.  Headache.  Feeling heartbeats that are irregular or faster than normal (palpitations).  Shortness of breath, especially with exercise.  Paleness.  Cold sensitivity.  Indigestion.  Nausea.  Difficulty sleeping.  Difficulty concentrating.  Symptoms may occur suddenly or develop slowly. If your anemia is mild, you may not have symptoms. How is this diagnosed? This condition is diagnosed based on:  Blood tests.  Your medical history.  A physical exam.  Bone marrow biopsy.  Your health care provider may also check your stool (feces) for blood and may do additional testing to look for the cause of your bleeding. You may also have other tests, including:  Imaging tests, such as a CT scan or MRI.  Endoscopy.  Colonoscopy.  How is this treated? Treatment for this condition depends on the cause. If you continue to lose a lot of blood,  you may need to be treated at a hospital. Treatment may include:  Taking supplements of iron, vitamin B12, or folic acid.  Taking a hormone medicine (erythropoietin) that can help to stimulate red blood cell growth.  Having a blood transfusion. This may be needed if you lose a lot of blood.  Making changes to your diet.  Having surgery to remove your spleen.  Follow these instructions at home:  Take over-the-counter and prescription medicines only as told by your health care provider.  Take supplements only as told by your health care provider.  Follow any diet instructions that you were given.  Keep all follow-up visits as told by your health care provider. This is important. Contact a health care provider if:  You develop new bleeding anywhere in the body. Get help right away if:  You are very weak.  You are short of breath.  You have pain in your abdomen or chest.  You are dizzy or feel faint.  You have trouble concentrating.  You have bloody or black, tarry stools.  You vomit repeatedly or you vomit up blood. Summary  Anemia is a condition in which you do not have enough red blood cells or enough of a substance in your red blood cells that carries oxygen (hemoglobin).  Symptoms may occur suddenly or develop slowly.  If your anemia is mild, you may not have symptoms.  This condition is diagnosed with blood tests as well as a medical history and physical exam. Other tests may be needed.  Treatment for this condition depends on the cause of the anemia. This information is not intended to replace advice   given to you by your health care provider. Make sure you discuss any questions you have with your health care provider. Document Released: 06/13/2004 Document Revised: 06/07/2016 Document Reviewed: 06/07/2016 Elsevier Interactive Patient Education  [employer].

## 2018-03-18 NOTE — Assessment & Plan Note (Signed)
Chronic, Olanzapine use.  Seen by psychiatry, no recent visit noted.  Lipid panel today with medication regimen.

## 2018-03-18 NOTE — Assessment & Plan Note (Signed)
Chronic, stable.  Continue current medication.
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
## 2018-03-19 LAB — COMPREHENSIVE METABOLIC PANEL
ALK PHOS: 42 IU/L (ref 39–117)
ALT: 118 IU/L — ABNORMAL HIGH (ref 0–44)
AST: 136 IU/L — ABNORMAL HIGH (ref 0–40)
Albumin/Globulin Ratio: 1.8 (ref 1.2–2.2)
Albumin: 4.6 g/dL (ref 3.6–4.8)
BUN/Creatinine Ratio: 14 (ref 10–24)
BUN: 15 mg/dL (ref 8–27)
Bilirubin Total: 0.7 mg/dL (ref 0.0–1.2)
CO2: 23 mmol/L (ref 20–29)
CREATININE: 1.05 mg/dL (ref 0.76–1.27)
Calcium: 10.3 mg/dL — ABNORMAL HIGH (ref 8.6–10.2)
Chloride: 102 mmol/L (ref 96–106)
GFR calc Af Amer: 86 mL/min/{1.73_m2} (ref >59)
GFR calc non Af Amer: 74 mL/min/{1.73_m2} (ref >59)
GLUCOSE: 100 mg/dL — AB (ref 65–99)
Globulin, Total: 2.5 g/dL (ref 1.5–4.5)
Potassium: 4.4 mmol/L (ref 3.5–5.2)
Sodium: 141 mmol/L (ref 134–144)
TOTAL PROTEIN: 7.1 g/dL (ref 6.0–8.5)

## 2018-03-19 LAB — CBC WITH DIFFERENTIAL/PLATELET
BASOS ABS: 0.1 10*3/uL (ref 0.0–0.2)
BASOS: 1 %
EOS (ABSOLUTE): 0.1 10*3/uL (ref 0.0–0.4)
EOS: 1 %
Hemoglobin: 16.1 g/dL (ref 13.0–17.7)
IMMATURE GRANULOCYTES: 0 %
Immature Grans (Abs): 0 10*3/uL (ref 0.0–0.1)
LYMPHS ABS: 1.5 10*3/uL (ref 0.7–3.1)
Lymphs: 16 %
MCH: 31 pg (ref 26.6–33.0)
MCHC: 34.9 g/dL (ref 31.5–35.7)
MCV: 89 fL (ref 79–97)
MONOCYTES: 11 %
MONOS ABS: 1 10*3/uL — AB (ref 0.1–0.9)
NEUTROS ABS: 6.9 10*3/uL (ref 1.4–7.0)
NEUTROS PCT: 71 %
Platelets: 339 10*3/uL (ref 150–450)
RBC: 5.19 x10E6/uL (ref 4.14–5.80)
RDW: 12.7 % (ref 12.3–15.4)
WBC: 9.7 10*3/uL (ref 3.4–10.8)

## 2018-03-19 LAB — ANEMIA PANEL
Ferritin: 490 ng/mL — ABNORMAL HIGH (ref 30–400)
Folate, Hemolysate: 468.2 ng/mL
Folate, RBC: 1016 ng/mL (ref >498)
HEMATOCRIT: 46.1 % (ref 37.5–51.0)
Iron Saturation: 35 % (ref 15–55)
Iron: 95 ug/dL (ref 38–169)
Retic Ct Pct: 1.7 % (ref 0.6–2.6)
TIBC: 268 ug/dL (ref 250–450)
UIBC: 173 ug/dL (ref 111–343)
Vitamin B-12: 1067 pg/mL (ref 232–1245)

## 2018-03-19 LAB — LIPID PANEL
CHOLESTEROL TOTAL: 192 mg/dL (ref 100–199)
Chol/HDL Ratio: 7.1 ratio — ABNORMAL HIGH (ref 0.0–5.0)
HDL: 27 mg/dL — AB (ref >39)
LDL CALC: 135 mg/dL — AB (ref 0–99)
Triglycerides: 152 mg/dL — ABNORMAL HIGH (ref 0–149)
VLDL CHOLESTEROL CAL: 30 mg/dL (ref 5–40)

## 2018-04-20 ENCOUNTER — Ambulatory Visit (INDEPENDENT_AMBULATORY_CARE_PROVIDER_SITE_OTHER): Payer: Medicare Other | Admitting: Nurse Practitioner

## 2018-04-20 ENCOUNTER — Other Ambulatory Visit: Payer: Self-pay

## 2018-04-20 ENCOUNTER — Encounter: Payer: Self-pay | Admitting: Nurse Practitioner

## 2018-04-20 VITALS — BP 121/81 | HR 70 | Temp 97.8°F | Ht 65.0 in | Wt 216.0 lb

## 2018-04-20 DIAGNOSIS — E7800 Pure hypercholesterolemia, unspecified: Secondary | ICD-10-CM | POA: Diagnosis not present

## 2018-04-20 DIAGNOSIS — R945 Abnormal results of liver function studies: Secondary | ICD-10-CM

## 2018-04-20 DIAGNOSIS — R7989 Other specified abnormal findings of blood chemistry: Secondary | ICD-10-CM

## 2018-04-20 DIAGNOSIS — B182 Chronic viral hepatitis C: Secondary | ICD-10-CM

## 2018-04-20 DIAGNOSIS — E785 Hyperlipidemia, unspecified: Secondary | ICD-10-CM | POA: Insufficient documentation

## 2018-04-20 NOTE — Progress Notes (Signed)
BP 121/81   Pulse 70   Temp 97.8 F (36.6 C) (Oral)   Ht _0  (1.651 m)   Wt 216 lb (98 kg)   SpO2 90%   BMI 35.94 kg/m    Subjective:    Patient ID: Ryan [name], male    DOB: November 21, 1950, 67 y.o.   MRN: [id number]  HPI: Ryan [name] is a 67 y.o. male presents for follow-up  Chief Complaint  Patient presents with  . Hyperlipidemia    f/u   HYPERLIPIDEMIA + ELEVATED LFT WITH CHRONIC HEP C Recent labs noted TCHOL 192, LDL 135, TRIG 152, and HDL 27.  CMP noted AST/ALT 136/118, previous in April 2019 were 33/48.  He denies Tylenol use.  Previously followed by GI for Hep C treatment, but last visit was 01/18/16, he was supposed to f/u with labs 6 months later but none noted on chart.  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  This morning he is refusing labs, discussed importance of lab work repeat d/t elevations noted on recent labs and he states "my liver is just fine and I just need to start on Lipitor for my cholesterol".  Discussed with him that Lipitor can not be started d/t elevation in LFT's and we need labs to reassess, he stated "that is fine then I will just work on my diet and cut down on ice cream".  Inquired as to what he ate for breakfast this morning he stated "sausage and egg biscuit from [employer]".  Educated him on low cholesterol diet, he states that the "sausage is not the issue, it is the ice cream".  Discussed with him returning to GI provider for f/u and he states "I refuse to go back to the stomach doctors, I don't need them because my liver is fine".  He refuses to start any other medications, other than Lipitor which is not recommended at this time d/t elevation in LFTs, at this time for cholesterol and he "will come back for labs in 6 months"; but "will not do them today".  Discussed current cardiac and GI risks with him and importance of discussion of POC, but he states "my heart and liver are fine, I will come back  in 6 months and you can look at me". Hyperlipidemia status: poor compliance Satisfied with current treatment?  no current treatment Side effects:  no Medication compliance: no current medications, refuses anything other than Lipitor and refuses to have LFTs rechecked today Past cholesterol meds: none Supplements: none Aspirin:  yes The 10-year ASCVD risk score [name], et al., 2013) is: 18.4%   Values used to calculate the score:     Age: 79 years     Sex: Male     Is Non-Hispanic African American: No     Diabetic: No     Tobacco smoker: No     Systolic Blood Pressure: 062 mmHg     Is BP treated: Yes     HDL Cholesterol: 27 mg/dL     Total Cholesterol: 192 mg/dL Chest pain:  no Coronary artery disease:  no Family history CAD:  no Family history early CAD:  no  Relevant past medical, surgical, family and social history reviewed and updated as indicated. Interim medical history since our last visit reviewed. Allergies and medications reviewed and updated.  Review of Systems  Constitutional: Negative for activity change, diaphoresis, fatigue and fever.  Respiratory: Negative for cough, chest tightness, shortness of  breath and wheezing.   Cardiovascular: Negative for chest pain, palpitations and leg swelling.  Gastrointestinal: Negative for abdominal distention, abdominal pain, constipation, diarrhea, nausea and vomiting.  Endocrine: Negative for cold intolerance, heat intolerance, polydipsia, polyphagia and polyuria.  Musculoskeletal: Negative.   Skin: Negative.   Neurological: Negative for dizziness, syncope, weakness, light-headedness, numbness and headaches.  Psychiatric/Behavioral: Negative.     Per HPI unless specifically indicated above     Objective:    BP 121/81   Pulse 70   Temp 97.8 F (36.6 C) (Oral)   Ht _0  (1.651 m)   Wt 216 lb (98 kg)   SpO2 90%   BMI 35.94 kg/m   Wt Readings from Last 3 Encounters:  04/20/18 216 lb (98 kg)  03/18/18 217 lb  (98.4 kg)  11/24/17 215 lb 3.2 oz (97.6 kg)    Physical Exam  Constitutional: He is oriented to person, place, and time. He appears well-developed and well-nourished.  HENT:  Head: Normocephalic and atraumatic.  Right Ear: Hearing normal. No drainage.  Left Ear: Hearing normal. No drainage.  Mouth/Throat: Uvula is midline and mucous membranes are normal.  Eyes: Pupils are equal, round, and reactive to light. Conjunctivae, EOM and lids are normal. Right eye exhibits no discharge. Left eye exhibits no discharge.  Neck: Trachea normal and normal range of motion. Neck supple. No JVD present. Carotid bruit is not present. No thyromegaly present.  Cardiovascular: Normal rate, regular rhythm, S1 normal, S2 normal and normal heart sounds. Exam reveals no gallop.  No murmur heard. Pulmonary/Chest: Effort normal and breath sounds normal.  Abdominal: Soft. Bowel sounds are normal. There is no splenomegaly or hepatomegaly.  Genitourinary: Rectum normal and penis normal.  Musculoskeletal: Normal range of motion.  Neurological: He is alert and oriented to person, place, and time. He has normal reflexes.  Skin: Skin is warm, dry and intact. Capillary refill takes less than 2 seconds. No rash noted.  Psychiatric: His behavior is normal. Judgment and thought content normal.  Slightly agitated and brisk demeanor this morning with provider.  Refusing all labs and discussion of treatment plan + referral to GI.  Nursing note and vitals reviewed.   Results for orders placed or performed in visit on 03/18/18  CBC w/Diff  Result Value Ref Range   WBC 9.7 3.4 - 10.8 x10E3/uL   RBC 5.19 4.14 - 5.80 x10E6/uL   Hemoglobin 16.1 13.0 - 17.7 g/dL   MCV 89 79 - 97 fL   MCH 31.0 26.6 - 33.0 pg   MCHC 34.9 31.5 - 35.7 g/dL   RDW 12.7 12.3 - 15.4 %   Platelets 339 150 - 450 x10E3/uL   Neutrophils 71 Not Estab. %   Lymphs 16 Not Estab. %   Monocytes 11 Not Estab. %   Eos 1 Not Estab. %   Basos 1 Not Estab. %    Neutrophils Absolute 6.9 1.4 - 7.0 x10E3/uL   Lymphocytes Absolute 1.5 0.7 - 3.1 x10E3/uL   Monocytes Absolute 1.0 (H) 0.1 - 0.9 x10E3/uL   EOS (ABSOLUTE) 0.1 0.0 - 0.4 x10E3/uL   Basophils Absolute 0.1 0.0 - 0.2 x10E3/uL   Immature Granulocytes 0 Not Estab. %   Immature Grans (Abs) 0.0 0.0 - 0.1 x10E3/uL  Anemia panel  Result Value Ref Range   Total Iron Binding Capacity 268 250 - 450 ug/dL   UIBC 173 111 - 343 ug/dL   Iron 95 38 - 169 ug/dL   Iron Saturation 35 15 - 55 %  Vitamin B-12 1,067 232 - 1,245 pg/mL   Folate, Hemolysate 468.2 Not Estab. ng/mL   Hematocrit 46.1 37.5 - 51.0 %   Folate, RBC 1,016 >498 ng/mL   Ferritin 490 (H) 30 - 400 ng/mL   Retic Ct Pct 1.7 0.6 - 2.6 %  Comp Met (CMET)  Result Value Ref Range   Glucose 100 (H) 65 - 99 mg/dL   BUN 15 8 - 27 mg/dL   Creatinine, Ser 1.05 0.76 - 1.27 mg/dL   GFR calc non Af Amer 74 >59 mL/min/1.73   GFR calc Af Amer 86 >59 mL/min/1.73   BUN/Creatinine Ratio 14 10 - 24   Sodium 141 134 - 144 mmol/L   Potassium 4.4 3.5 - 5.2 mmol/L   Chloride 102 96 - 106 mmol/L   CO2 23 20 - 29 mmol/L   Calcium 10.3 (H) 8.6 - 10.2 mg/dL   Total Protein 7.1 6.0 - 8.5 g/dL   Albumin 4.6 3.6 - 4.8 g/dL   Globulin, Total 2.5 1.5 - 4.5 g/dL   Albumin/Globulin Ratio 1.8 1.2 - 2.2   Bilirubin Total 0.7 0.0 - 1.2 mg/dL   Alkaline Phosphatase 42 39 - 117 IU/L   AST 136 (H) 0 - 40 IU/L   ALT 118 (H) 0 - 44 IU/L  Lipid Profile  Result Value Ref Range   Cholesterol, Total 192 100 - 199 mg/dL   Triglycerides 152 (H) 0 - 149 mg/dL   HDL 27 (L) >39 mg/dL   VLDL Cholesterol Cal 30 5 - 40 mg/dL   LDL Calculated 135 (H) 0 - 99 mg/dL   Chol/HDL Ratio 7.1 (H) 0.0 - 5.0 ratio      Assessment & Plan:   Problem List Items Addressed This Visit      Digestive   Hepatitis C    Last GI visit 01/18/16, during which it appears he did not f/u for 6 mos labs.  Recent AST/ALT in October elevated 136/118, previous April 2019 33/48.  He refuses to return  to GI and refuses labs or treatment discussion today.  He reports he will come back in 6 months for lab work.  Advised him to return sooner if symptoms present.        Other   Elevated LFTs    October 2019 AST/ALT 136/118, previous April 2019 33/48.  He refuses lab repeat today and refuses referral to GI or discussion of treatment options today.  He states he will return in 6 months for repeat labs.  Instructed him to return sooner if symptoms present.      Hyperlipidemia    Refuses lab repeat today or treatment initiation, other than Lipitor (which provider informed him d/t elevation in LFT this is not recommended).  He reports he will focus on diet then, however he refused to have education on diet provided to him today.  Reports he will come back in 6 months for labs.          Follow up plan: Return in about 6 months (around 10/20/2018) for lipid panel check and .

## 2018-04-20 NOTE — Assessment & Plan Note (Signed)
Last GI visit 01/18/16, during which it appears he did not f/u for 6 mos labs.  Recent AST/ALT in October elevated 136/118, previous April 2019 33/48.  He refuses to return to GI and refuses labs or treatment discussion today.  He reports he will come back in 6 months for lab work.  Advised him to return sooner if symptoms present.

## 2018-04-20 NOTE — Assessment & Plan Note (Signed)
October 2019 AST/ALT 136/118, previous April 2019 33/48.  He refuses lab repeat today and refuses referral to GI or discussion of treatment options today.  He states he will return in 6 months for repeat labs.  Instructed him to return sooner if symptoms present.

## 2018-04-20 NOTE — Assessment & Plan Note (Signed)
Refuses lab repeat today or treatment initiation, other than Lipitor (which provider informed him d/t elevation in LFT this is not recommended).  He reports he will focus on diet then, however he refused to have education on diet provided to him today.  Reports he will come back in 6 months for labs.

## 2018-04-20 NOTE — Patient Instructions (Signed)
Fat and Cholesterol Restricted Diet Getting too much fat and cholesterol in your diet may cause health problems. Following this diet helps keep your fat and cholesterol at normal levels. This can keep you from getting sick. What types of fat should I choose?  Choose monosaturated and polyunsaturated fats. These are found in foods such as olive oil, canola oil, flaxseeds, walnuts, almonds, and seeds.  Eat more omega-3 fats. Good choices include salmon, mackerel, sardines, tuna, flaxseed oil, and ground flaxseeds.  Limit saturated fats. These are in animal products such as meats, butter, and cream. They can also be in plant products such as palm oil, palm kernel oil, and coconut oil.  Avoid foods with partially hydrogenated oils in them. These contain trans fats. Examples of foods that have trans fats are stick margarine, some tub margarines, cookies, crackers, and other baked goods. What general guidelines do I need to follow?  Check food labels. Look for the words "trans fat" and "saturated fat."  When preparing a meal: ? Fill half of your plate with vegetables and green salads. ? Fill one fourth of your plate with whole grains. Look for the word "whole" as the first word in the ingredient list. ? Fill one fourth of your plate with lean protein foods.  Eat more foods that have fiber, like apples, carrots, beans, peas, and barley.  Eat more home-cooked foods. Eat less at restaurants and buffets.  Limit or avoid alcohol.  Limit foods high in starch and sugar.  Limit fried foods.  Cook foods without frying them. Baking, boiling, grilling, and broiling are all great options.  Lose weight if you are overweight. Losing even a small amount of weight can help your overall health. It can also help prevent diseases such as diabetes and heart disease. What foods can I eat? Grains Whole grains, such as whole wheat or whole grain breads, crackers, cereals, and pasta. Unsweetened oatmeal,  bulgur, barley, quinoa, or brown rice. Corn or whole wheat flour tortillas. Vegetables Fresh or frozen vegetables (raw, steamed, roasted, or grilled). Green salads. Fruits All fresh, canned (in natural juice), or frozen fruits. Meat and Other Protein Products Ground beef (85% or leaner), grass-fed beef, or beef trimmed of fat. Skinless chicken or turkey. Ground chicken or turkey. Pork trimmed of fat. All fish and seafood. Eggs. Dried beans, peas, or lentils. Unsalted nuts or seeds. Unsalted canned or dry beans. Dairy Low-fat dairy products, such as skim or 1% milk, 2% or reduced-fat cheeses, low-fat ricotta or cottage cheese, or plain low-fat yogurt. Fats and Oils Tub margarines without trans fats. Light or reduced-fat mayonnaise and salad dressings. Avocado. Olive, canola, sesame, or safflower oils. Natural peanut or almond butter (choose ones without added sugar and oil). The items listed above may not be a complete list of recommended foods or beverages. Contact your dietitian for more options. What foods are not recommended? Grains White bread. White pasta. White rice. Cornbread. Bagels, pastries, and croissants. Crackers that contain trans fat. Vegetables White potatoes. Corn. Creamed or fried vegetables. Vegetables in a cheese sauce. Fruits Dried fruits. Canned fruit in light or heavy syrup. Fruit juice. Meat and Other Protein Products Fatty cuts of meat. Ribs, chicken wings, bacon, sausage, bologna, salami, chitterlings, fatback, hot dogs, bratwurst, and packaged luncheon meats. Liver and organ meats. Dairy Whole or 2% milk, cream, half-and-half, and cream cheese. Whole milk cheeses. Whole-fat or sweetened yogurt. Full-fat cheeses. Nondairy creamers and whipped toppings. Processed cheese, cheese spreads, or cheese curds. Sweets and Desserts Corn   syrup, sugars, honey, and molasses. Candy. Jam and jelly. Syrup. Sweetened cereals. Cookies, pies, cakes, donuts, muffins, and ice  cream. Fats and Oils Butter, stick margarine, lard, shortening, ghee, or bacon fat. Coconut, palm kernel, or palm oils. Beverages Alcohol. Sweetened drinks (such as sodas, lemonade, and fruit drinks or punches). The items listed above may not be a complete list of foods and beverages to avoid. Contact your dietitian for more information. This information is not intended to replace advice given to you by your health care provider. Make sure you discuss any questions you have with your health care provider. Document Released: 11/05/2011 Document Revised: 01/11/2016 Document Reviewed: 08/05/2013 Elsevier Interactive Patient Education  2018 [employer]  

## 2018-07-05 ENCOUNTER — Other Ambulatory Visit: Payer: Self-pay | Admitting: Nurse Practitioner

## 2018-07-06 NOTE — Telephone Encounter (Signed)
Requested Prescriptions  Pending Prescriptions Disp Refills  . meloxicam (MOBIC) 15 MG tablet [Pharmacy Med Name: MELOXICAM 15 MG TABLET] 90 tablet 0    Sig: TAKE 1 TABLET BY MOUTH EVERY DAY     Analgesics:  COX2 Inhibitors Passed - 07/05/2018  1:19 AM      Passed - HGB in normal range and within 360 days    Hemoglobin  Date Value Ref Range Status  03/18/2018 16.1 13.0 - 17.7 g/dL Final         Passed - Cr in normal range and within 360 days    Creatinine  Date Value Ref Range Status  03/10/2014 0.79 0.60 - 1.30 mg/dL Final   Creatinine, Ser  Date Value Ref Range Status  03/18/2018 1.05 0.76 - 1.27 mg/dL Final         Passed - Patient is not pregnant      Passed - Valid encounter within last 12 months    Recent Outpatient Visits          2 months ago Chronic hepatitis C without hepatic coma ([place])   [place], Boothwyn T, NP   3 months ago Hypertensive chronic kidney disease, unspecified CKD stage   [facility] [place], [name] T, NP   7 months ago Spinal stenosis of lumbosacral region   [facility] [place], [place], NP   9 months ago Leukocytosis, unspecified type   [facility] Kathrine Haddock, NP   1 year ago Undifferentiated schizophrenia ([place])   [place] Kathrine Haddock, NP      Future Appointments            In 2 months  [place], [place]   In 2 months [place], [name], NP [facility], [place]   In 3 months [place], [name], NP [facility], [facility]

## 2018-09-10 ENCOUNTER — Ambulatory Visit: Payer: Self-pay

## 2018-09-11 ENCOUNTER — Ambulatory Visit: Payer: Self-pay

## 2018-09-16 ENCOUNTER — Ambulatory Visit: Payer: Medicare Other | Admitting: Nurse Practitioner

## 2018-10-02 ENCOUNTER — Other Ambulatory Visit: Payer: Self-pay | Admitting: Nurse Practitioner

## 2018-10-02 NOTE — Telephone Encounter (Signed)
Requested medication (s) are due for refill today:  Yes  Requested medication (s) are on the active medication list:   Yes  Future visit scheduled:   No.   He has cancelled his last 4 scheduled appts.   Last ordered: 07/06/2018 by [name]  #90  0 refills   Requested Prescriptions  Pending Prescriptions Disp Refills   meloxicam (MOBIC) 15 MG tablet [Pharmacy Med Name: MELOXICAM 15 MG TABLET] 90 tablet 0    Sig: TAKE 1 TABLET BY MOUTH EVERY DAY     Analgesics:  COX2 Inhibitors Passed - 10/02/2018  9:57 AM      Passed - HGB in normal range and within 360 days    Hemoglobin  Date Value Ref Range Status  03/18/2018 16.1 13.0 - 17.7 g/dL Final         Passed - Cr in normal range and within 360 days    Creatinine  Date Value Ref Range Status  03/10/2014 0.79 0.60 - 1.30 mg/dL Final   Creatinine, Ser  Date Value Ref Range Status  03/18/2018 1.05 0.76 - 1.27 mg/dL Final         Passed - Patient is not pregnant      Passed - Valid encounter within last 12 months    Recent Outpatient Visits          5 months ago Chronic hepatitis C without hepatic coma ([place])   [place], [name] T, NP   6 months ago Hypertensive chronic kidney disease, unspecified CKD stage   [facility] [place], [name] T, NP   10 months ago Spinal stenosis of lumbosacral region   [facility] Kathrine Haddock, NP   1 year ago Leukocytosis, unspecified type   [facility] Kathrine Haddock, NP   1 year ago Undifferentiated schizophrenia [facility])   [place] Kathrine Haddock, NP      Future Appointments            In 1 month [place], [facility]

## 2018-10-20 ENCOUNTER — Ambulatory Visit: Payer: Self-pay | Admitting: Nurse Practitioner

## 2018-11-19 ENCOUNTER — Ambulatory Visit (INDEPENDENT_AMBULATORY_CARE_PROVIDER_SITE_OTHER): Payer: Medicare Other

## 2018-11-19 DIAGNOSIS — Z0000 Encounter for general adult medical examination without abnormal findings: Secondary | ICD-10-CM

## 2018-11-19 NOTE — Patient Instructions (Signed)
Ryan [name] , Thank you for taking time to come for your Medicare Wellness Visit. I appreciate your ongoing commitment to your health goals. Please review the following plan we discussed and let me know if I can assist you in the future.   Screening recommendations/referrals: Colonoscopy: completed 09/19/2009, due 2021 Recommended yearly ophthalmology/optometry visit for glaucoma screening and checkup Recommended yearly dental visit for hygiene and checkup  Vaccinations: Influenza vaccine: up to date Pneumococcal vaccine: please bring [facility] Tdap vaccine: up to date  Shingles vaccine: shingrix eligible, check with your insurance for coverage   Advanced directives: Please bring a copy of your health care power of attorney and living will to the office at your convenience   Next appointment: Follow up in one year for your annual wellness exam.   Preventive Care 65 Years and Older, Male Preventive care refers to lifestyle choices and visits with your health care provider that can promote health and wellness. What does preventive care include?  A yearly physical exam. This is also called an annual well check.  Dental exams once or twice a year.  Routine eye exams. Ask your health care provider how often you should have your eyes checked.  Personal lifestyle choices, including:  Daily care of your teeth and gums.  Regular physical activity.  Eating a healthy diet.  Avoiding tobacco and drug use.  Limiting alcohol use.  Practicing safe sex.  Taking low doses of aspirin every day.  Taking vitamin and mineral supplements as recommended by your health care provider. What happens during an annual well check? The services and screenings done by your health care provider during your annual well check will depend on your age, overall health, lifestyle risk factors, and family history of disease. Counseling  Your health care provider may ask you questions about  your:  Alcohol use.  Tobacco use.  Drug use.  Emotional well-being.  Home and relationship well-being.  Sexual activity.  Eating habits.  History of falls.  Memory and ability to understand (cognition).  Work and work Statistician. Screening  You may have the following tests or measurements:  Height, weight, and BMI.  Blood pressure.  Lipid and cholesterol levels. These may be checked every 5 years, or more frequently if you are over 23 years old.  Skin check.  Lung cancer screening. You may have this screening every year starting at age 52 if you have a 30-pack-year history of smoking and currently smoke or have quit within the past 15 years.  Fecal occult blood test (FOBT) of the stool. You may have this test every year starting at age 59.  Flexible sigmoidoscopy or colonoscopy. You may have a sigmoidoscopy every 5 years or a colonoscopy every 10 years starting at age 83.  Prostate cancer screening. Recommendations will vary depending on your family history and other risks.  Hepatitis C blood test.  Hepatitis B blood test.  Sexually transmitted disease (STD) testing.  Diabetes screening. This is done by checking your blood sugar (glucose) after you have not eaten for a while (fasting). You may have this done every 1-3 years.  Abdominal aortic aneurysm (AAA) screening. You may need this if you are a current or former smoker.  Osteoporosis. You may be screened starting at age 44 if you are at high risk. Talk with your health care provider about your test results, treatment options, and if necessary, the need for more tests. Vaccines  Your health care provider may recommend certain  vaccines, such as:  Influenza vaccine. This is recommended every year.  Tetanus, diphtheria, and acellular pertussis (Tdap, Td) vaccine. You may need a Td booster every 10 years.  Zoster vaccine. You may need this after age 26.  Pneumococcal 13-valent conjugate (PCV13) vaccine.  One dose is recommended after age 46.  Pneumococcal polysaccharide (PPSV23) vaccine. One dose is recommended after age 29. Talk to your health care provider about which screenings and vaccines you need and how often you need them. This information is not intended to replace advice given to you by your health care provider. Make sure you discuss any questions you have with your health care provider. Document Released: 06/02/2015 Document Revised: 01/24/2016 Document Reviewed: 03/07/2015 Elsevier Interactive Patient Education  2017 [place] Prevention in the Home Falls can cause injuries. They can happen to people of all ages. There are many things you can do to make your home safe and to help prevent falls. What can I do on the outside of my home?  Regularly fix the edges of walkways and driveways and fix any cracks.  Remove anything that might make you trip as you walk through a door, such as a raised step or threshold.  Trim any bushes or trees on the path to your home.  Use bright outdoor lighting.  Clear any walking paths of anything that might make someone trip, such as rocks or tools.  Regularly check to see if handrails are loose or broken. Make sure that both sides of any steps have handrails.  Any raised decks and porches should have guardrails on the edges.  Have any leaves, snow, or ice cleared regularly.  Use sand or salt on walking paths during winter.  Clean up any spills in your garage right away. This includes oil or grease spills. What can I do in the bathroom?  Use night lights.  Install grab bars by the toilet and in the tub and shower. Do not use towel bars as grab bars.  Use non-skid mats or decals in the tub or shower.  If you need to sit down in the shower, use a plastic, non-slip stool.  Keep the floor dry. Clean up any water that spills on the floor as soon as it happens.  Remove soap buildup in the tub or shower regularly.  Attach bath  mats securely with double-sided non-slip rug tape.  Do not have throw rugs and other things on the floor that can make you trip. What can I do in the bedroom?  Use night lights.  Make sure that you have a light by your bed that is easy to reach.  Do not use any sheets or blankets that are too big for your bed. They should not hang down onto the floor.  Have a firm chair that has side arms. You can use this for support while you get dressed.  Do not have throw rugs and other things on the floor that can make you trip. What can I do in the kitchen?  Clean up any spills right away.  Avoid walking on wet floors.  Keep items that you use a lot in easy-to-reach places.  If you need to reach something above you, use a strong step stool that has a grab bar.  Keep electrical cords out of the way.  Do not use floor polish or wax that makes floors slippery. If you must use wax, use non-skid floor wax.  Do not have throw rugs and other things  on the floor that can make you trip. What can I do with my stairs?  Do not leave any items on the stairs.  Make sure that there are handrails on both sides of the stairs and use them. Fix handrails that are broken or loose. Make sure that handrails are as long as the stairways.  Check any carpeting to make sure that it is firmly attached to the stairs. Fix any carpet that is loose or worn.  Avoid having throw rugs at the top or bottom of the stairs. If you do have throw rugs, attach them to the floor with carpet tape.  Make sure that you have a light switch at the top of the stairs and the bottom of the stairs. If you do not have them, ask someone to add them for you. What else can I do to help prevent falls?  Wear shoes that:  Do not have high heels.  Have rubber bottoms.  Are comfortable and fit you well.  Are closed at the toe. Do not wear sandals.  If you use a stepladder:  Make sure that it is fully opened. Do not climb a closed  stepladder.  Make sure that both sides of the stepladder are locked into place.  Ask someone to hold it for you, if possible.  Clearly mark and make sure that you can see:  Any grab bars or handrails.  First and last steps.  Where the edge of each step is.  Use tools that help you move around (mobility aids) if they are needed. These include:  Canes.  Walkers.  Scooters.  Crutches.  Turn on the lights when you go into a dark area. Replace any light bulbs as soon as they burn out.  Set up your furniture so you have a clear path. Avoid moving your furniture around.  If any of your floors are uneven, fix them.  If there are any pets around you, be aware of where they are.  Review your medicines with your doctor. Some medicines can make you feel dizzy. This can increase your chance of falling. Ask your doctor what other things that you can do to help prevent falls. This information is not intended to replace advice given to you by your health care provider. Make sure you discuss any questions you have with your health care provider. Document Released: 03/02/2009 Document Revised: 10/12/2015 Document Reviewed: 06/10/2014 Elsevier Interactive Patient Education  2017 [employer].
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
## 2018-12-28 ENCOUNTER — Other Ambulatory Visit: Payer: Self-pay | Admitting: Nurse Practitioner

## 2019-02-12 ENCOUNTER — Ambulatory Visit: Payer: Self-pay | Admitting: Nurse Practitioner

## 2019-03-26 ENCOUNTER — Other Ambulatory Visit: Payer: Self-pay | Admitting: Nurse Practitioner

## 2019-06-17 ENCOUNTER — Telehealth: Payer: Self-pay

## 2019-06-17 NOTE — Telephone Encounter (Signed)
Patient calling in stating that they feel knots in their throat and it is hard to swallow. Pt waiting on call back to see what can be done. Please advise.

## 2019-06-17 NOTE — Telephone Encounter (Signed)
If having chest pain, should be seen at [facility], otherwise OK to wait for tomorrow

## 2019-06-17 NOTE — Telephone Encounter (Signed)
Can this patient wait until tomorrow to have a visit? Copied from [place] [id number]. Topic: General - Other >> Jun 17, 2019  9:13 AM Ryan [name] wrote: Reason for CRM: Pt called saying he was with his aunt at Christmas and she has had covid.  He says he has been having some noon specific chest pains, a little tightness in his throat other than that he says he is feeling fine... He does not have a license but can get a ride to an appt if needed.  He has not had a test for covid.  He said he called a couple weeks ago but no one called him back.  Please call him back to discuss his symptoms.  [phone] >> Jun 17, 2019  9:41 AM Ryan [name] wrote: Pt ok to wait for appt?  Appt on hold with Dr Wynetta Emery for tomorrow at 8:45.

## 2019-06-17 NOTE — Telephone Encounter (Signed)
done

## 2019-06-17 NOTE — Telephone Encounter (Signed)
Please schedule patient for 845 in the morning as just a telephone visit.

## 2019-06-18 ENCOUNTER — Ambulatory Visit: Payer: Medicare Other | Admitting: Family Medicine

## 2019-06-21 ENCOUNTER — Ambulatory Visit (INDEPENDENT_AMBULATORY_CARE_PROVIDER_SITE_OTHER): Payer: Medicare Other | Admitting: Family Medicine

## 2019-06-21 ENCOUNTER — Encounter: Payer: Self-pay | Admitting: Family Medicine

## 2019-06-21 ENCOUNTER — Other Ambulatory Visit: Payer: Self-pay

## 2019-06-21 VITALS — BP 129/82 | HR 91 | Temp 98.1°F

## 2019-06-21 DIAGNOSIS — D649 Anemia, unspecified: Secondary | ICD-10-CM

## 2019-06-21 DIAGNOSIS — B182 Chronic viral hepatitis C: Secondary | ICD-10-CM

## 2019-06-21 DIAGNOSIS — Z5321 Procedure and treatment not carried out due to patient leaving prior to being seen by health care provider: Secondary | ICD-10-CM

## 2019-06-21 DIAGNOSIS — Z125 Encounter for screening for malignant neoplasm of prostate: Secondary | ICD-10-CM

## 2019-06-21 DIAGNOSIS — I129 Hypertensive chronic kidney disease with stage 1 through stage 4 chronic kidney disease, or unspecified chronic kidney disease: Secondary | ICD-10-CM

## 2019-06-21 DIAGNOSIS — R7989 Other specified abnormal findings of blood chemistry: Secondary | ICD-10-CM

## 2019-06-21 DIAGNOSIS — E7800 Pure hypercholesterolemia, unspecified: Secondary | ICD-10-CM

## 2019-06-21 DIAGNOSIS — R739 Hyperglycemia, unspecified: Secondary | ICD-10-CM

## 2019-06-21 LAB — BAYER DCA HB A1C WAIVED: HB A1C (BAYER DCA - WAIVED): 6.3 % (ref <7.0)

## 2019-06-21 NOTE — Progress Notes (Signed)
Patient came in today for follow up. Had vitals done and went to the lab for labs draw, but then left prior to speaking with provider. Called to convert to virtual visit and he did not answer the phone. Will no-charge appointment today. Will need follow up appointment with PCP ASAP for refills/to discuss chronic issues and to go over results.

## 2019-06-21 NOTE — Telephone Encounter (Signed)
Pt called back and I saw Dr Wynetta Emery note that pt needs appt for refills and follow up.  Pt states he has a new phone which I have updated.  Pt has been scheduled for 06/22/19.  Pt states he is unable to come in for an appt.

## 2019-06-21 NOTE — Patient Instructions (Signed)
We are recommending the vaccine to everyone who has not had an allergic reaction to any of the components of the vaccine. If you have specific questions about the vaccine, please bring them up with your health care provider to discuss them.   We will likely not be getting the vaccine in the office for the first rounds of vaccinations. The way they are releasing the vaccines is going to be through the health systems (like [facility], [facility], [facility], [facility]) or through your county health department.   The [facility] is giving vaccines to those 75+ starting 05/26/19  M-F 7AM to 4PM [facility] [address], [place], [place] [facility] in a drive through tent  If you are 65+ you can get a vaccine through [place] by signing up for an appointment.  You can sign up by going to: [email or web address].  You can get more information by going to: [email or web address].gov/vaccines 

## 2019-06-22 ENCOUNTER — Ambulatory Visit (INDEPENDENT_AMBULATORY_CARE_PROVIDER_SITE_OTHER): Payer: Medicare Other | Admitting: Nurse Practitioner

## 2019-06-22 ENCOUNTER — Encounter: Payer: Self-pay | Admitting: Nurse Practitioner

## 2019-06-22 DIAGNOSIS — Z5321 Procedure and treatment not carried out due to patient leaving prior to being seen by health care provider: Secondary | ICD-10-CM

## 2019-06-22 NOTE — Progress Notes (Signed)
Attempted to call patient at 75, he initially answered, stated hello and then no further words spoken, line disconnected.  Attempted to call back x 2 at [phone] and [phone] with no response, left message.  He called back at 1605 to front desk, but PCP was in with another patient.  Attempted to call back at 1621 and no answer, left general HIPPA complaint message.  Due to patient health history would benefit from in office visit vs phone.  Will attempt to reschedule for in office.

## 2019-06-23 ENCOUNTER — Encounter: Payer: Self-pay | Admitting: Nurse Practitioner

## 2019-06-23 LAB — COMPREHENSIVE METABOLIC PANEL WITH GFR
ALT: 102 IU/L — ABNORMAL HIGH (ref 0–44)
AST: 100 IU/L — ABNORMAL HIGH (ref 0–40)
Albumin/Globulin Ratio: 1.6 (ref 1.2–2.2)
Albumin: 4.2 g/dL (ref 3.8–4.8)
Alkaline Phosphatase: 49 IU/L (ref 39–117)
BUN/Creatinine Ratio: 15 (ref 10–24)
BUN: 14 mg/dL (ref 8–27)
Bilirubin Total: 0.7 mg/dL (ref 0.0–1.2)
CO2: 22 mmol/L (ref 20–29)
Calcium: 9.5 mg/dL (ref 8.6–10.2)
Chloride: 101 mmol/L (ref 96–106)
Creatinine, Ser: 0.91 mg/dL (ref 0.76–1.27)
GFR calc Af Amer: 101 mL/min/1.73 (ref >59)
GFR calc non Af Amer: 88 mL/min/1.73 (ref >59)
Globulin, Total: 2.6 g/dL (ref 1.5–4.5)
Glucose: 115 mg/dL — ABNORMAL HIGH (ref 65–99)
Potassium: 4.4 mmol/L (ref 3.5–5.2)
Sodium: 138 mmol/L (ref 134–144)
Total Protein: 6.8 g/dL (ref 6.0–8.5)

## 2019-06-23 LAB — CBC WITH DIFFERENTIAL/PLATELET
Basophils Absolute: 0.1 10*3/uL (ref 0.0–0.2)
Basos: 1 %
EOS (ABSOLUTE): 0.2 10*3/uL (ref 0.0–0.4)
Eos: 2 %
Hematocrit: 43.6 % (ref 37.5–51.0)
Hemoglobin: 14.9 g/dL (ref 13.0–17.7)
Immature Grans (Abs): 0 10*3/uL (ref 0.0–0.1)
Immature Granulocytes: 0 %
Lymphocytes Absolute: 1.7 10*3/uL (ref 0.7–3.1)
Lymphs: 19 %
MCH: 30.4 pg (ref 26.6–33.0)
MCHC: 34.2 g/dL (ref 31.5–35.7)
MCV: 89 fL (ref 79–97)
Monocytes Absolute: 0.9 10*3/uL (ref 0.1–0.9)
Monocytes: 10 %
Neutrophils Absolute: 5.9 10*3/uL (ref 1.4–7.0)
Neutrophils: 68 %
Platelets: 320 10*3/uL (ref 150–450)
RBC: 4.9 x10E6/uL (ref 4.14–5.80)
RDW: 13.3 % (ref 11.6–15.4)
WBC: 8.8 10*3/uL (ref 3.4–10.8)

## 2019-06-23 LAB — LIPID PANEL W/O CHOL/HDL RATIO
Cholesterol, Total: 183 mg/dL (ref 100–199)
HDL: 30 mg/dL — ABNORMAL LOW (ref >39)
LDL Chol Calc (NIH): 128 mg/dL — ABNORMAL HIGH (ref 0–99)
Triglycerides: 137 mg/dL (ref 0–149)
VLDL Cholesterol Cal: 25 mg/dL (ref 5–40)

## 2019-06-23 LAB — HCV RNA QUANT: Hepatitis C Quantitation: NOT DETECTED IU/mL

## 2019-06-23 LAB — PSA: Prostate Specific Ag, Serum: 0.5 ng/mL (ref 0.0–4.0)

## 2019-06-23 LAB — TSH: TSH: 1.57 u[IU]/mL (ref 0.450–4.500)

## 2019-06-24 ENCOUNTER — Encounter: Payer: Self-pay | Admitting: Family Medicine

## 2019-07-02 ENCOUNTER — Other Ambulatory Visit: Payer: Self-pay

## 2019-07-02 ENCOUNTER — Ambulatory Visit (INDEPENDENT_AMBULATORY_CARE_PROVIDER_SITE_OTHER): Payer: Medicare Other | Admitting: Nurse Practitioner

## 2019-07-02 ENCOUNTER — Encounter: Payer: Self-pay | Admitting: Nurse Practitioner

## 2019-07-02 VITALS — BP 133/82 | HR 69 | Temp 98.1°F

## 2019-07-02 DIAGNOSIS — B182 Chronic viral hepatitis C: Secondary | ICD-10-CM

## 2019-07-02 DIAGNOSIS — E7800 Pure hypercholesterolemia, unspecified: Secondary | ICD-10-CM

## 2019-07-02 DIAGNOSIS — I10 Essential (primary) hypertension: Secondary | ICD-10-CM

## 2019-07-02 DIAGNOSIS — F200 Paranoid schizophrenia: Secondary | ICD-10-CM

## 2019-07-02 DIAGNOSIS — D649 Anemia, unspecified: Secondary | ICD-10-CM

## 2019-07-02 MED ORDER — ATENOLOL 25 MG PO TABS: 25.0000 mg | ORAL_TABLET | Freq: Every day | ORAL | 4 refills | Status: DC

## 2019-07-02 MED ORDER — GABAPENTIN 100 MG PO CAPS: ORAL_CAPSULE | ORAL | 3 refills | Status: DC

## 2019-07-02 NOTE — Progress Notes (Signed)
BP 133/82   Pulse 69   Temp 98.1 F (36.7 C) (Oral)   SpO2 94%    Subjective:    Patient ID: Ryan [name], adult    DOB: [birth year], 69 y.o.   MRN: [id number]  HPI: Ryan [name] is a 69 y.o. adult  Chief Complaint  Patient presents with  . Hypertension  . Schizophrenia    HYPERTENSION Has been on Atenolol for years. Denies syncope, dizziness, or headaches.  Has history of Hep C, recent labs showed HCV not detected and LFT on downward trend previously 136/118 and recent 100/102.  He continues to refuse GI return, was followed in past (2017), and refuses abdominal imaging.  Recent lipid panel LDL 128, continues to work on diet and refuses all medication.  Reports "I am doing just find and do not want all that stuff". Hypertension status: controlled  Satisfied with current treatment? yes Duration of hypertension: chronic BP monitoring frequency:  not checking BP range:  BP medication side effects:  no Medication compliance: excellent compliance Previous BP meds: does not recall Aspirin: yes Recurrent headaches: no Visual changes: no Palpitations: no Dyspnea: no Chest pain: no Lower extremity edema: no Dizzy/lightheaded: no The 10-year ASCVD risk score [name], et al., 2013) is: 20.9%   Values used to calculate the score:     Age: 69 years     Sex: Male     Is Non-Hispanic African American: No     Diabetic: No     Tobacco smoker: No     Systolic Blood Pressure: [id number] mmHg     Is BP treated: Yes     HDL Cholesterol: 30 mg/dL     Total Cholesterol: 183 mg/dL   ELEVATED A1C Recent A1C 6.3%, he reports wishes to work on diet changes. States he eats too much ice cream and Cheerios. Polydipsia/polyuria: no Visual disturbance: no Chest pain: no Paresthesias: no  ANEMIA: Chronic, ongoing.  On review of chart HGB often in 10 range.  He reports he does "not need anymore medication".  Reports he has lots of greens "spinach, beans" and "cabinets are overflowing" in  house.  This was improved on recent labs with H/H 14.9/43.6 and MCV 89.  SCHIZOPHRENIA: Chronic, ongoing.  Continues on Olanzapine.  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  He reports being followed by [facility] at this time and takes Olanzapine 20 MG, not 40 MG, he reports he told psychiatry it made him too tired and they reduced.  Denies any auditory or visual hallucinations.  No SI/HI.  Reports "I do good every day".  Relevant past medical, surgical, family and social history reviewed and updated as indicated. Interim medical history since our last visit reviewed. Allergies and medications reviewed and updated.  Review of Systems  Constitutional: Negative for activity change, diaphoresis, fatigue and fever.  Respiratory: Negative for cough, chest tightness, shortness of breath and wheezing.   Cardiovascular: Negative for chest pain, palpitations and leg swelling.  Endocrine: Negative for polydipsia, polyphagia and polyuria.  Psychiatric/Behavioral: Negative.     Per HPI unless specifically indicated above     Objective:    BP 133/82   Pulse 69   Temp 98.1 F (36.7 C) (Oral)   SpO2 94%   Wt Readings from Last 3 Encounters:  04/20/18 216 lb (98 kg)  03/18/18 217 lb (98.4 kg)  11/24/17 215 lb 3.2 oz (97.6 kg)    Physical Exam Vitals and nursing note reviewed.  Constitutional:  General: She is awake. She is not in acute distress.    Appearance: She is well-developed. She is obese. She is not ill-appearing.  HENT:     Head: Normocephalic and atraumatic.     Right Ear: Hearing normal. No drainage.     Left Ear: Hearing normal. No drainage.  Eyes:     General: Lids are normal.        Right eye: No discharge.        Left eye: No discharge.     Conjunctiva/sclera: Conjunctivae normal.     Pupils: Pupils are equal, round, and reactive to light.  Neck:     Thyroid: No thyromegaly.     Vascular: No carotid bruit or JVD.  Cardiovascular:     Rate and Rhythm: Normal rate and regular  rhythm.     Heart sounds: Normal heart sounds, S1 normal and S2 normal. No murmur. No gallop.   Pulmonary:     Effort: Pulmonary effort is normal. No accessory muscle usage or respiratory distress.     Breath sounds: Normal breath sounds.  Abdominal:     General: Bowel sounds are normal. There is no distension.     Palpations: Abdomen is soft.     Tenderness: There is no abdominal tenderness.  Genitourinary:    Penis: Normal.      Rectum: Normal.  Musculoskeletal:        General: Normal range of motion.     Cervical back: Normal range of motion and neck supple.  Skin:    General: Skin is warm and dry.     Capillary Refill: Capillary refill takes less than 2 seconds.  Neurological:     Mental Status: She is alert and oriented to person, place, and time.     Deep Tendon Reflexes: Reflexes are normal and symmetric.  Psychiatric:        Attention and Perception: Attention normal.        Mood and Affect: Mood normal.        Behavior: Behavior normal. Behavior is cooperative.        Thought Content: Thought content is not paranoid (No paranoia today).     Comments: Pleasant demeanor today.     Results for orders placed or performed in visit on 06/21/19  Bayer DCA Hb A1c Waived  Result Value Ref Range   HB A1C (BAYER DCA - WAIVED) 6.3 <7.0 %  CBC with Differential/Platelet  Result Value Ref Range   WBC 8.8 3.4 - 10.8 x10E3/uL   RBC 4.90 4.14 - 5.80 x10E6/uL   Hemoglobin 14.9 13.0 - 17.7 g/dL   Hematocrit 43.6 37.5 - 51.0 %   MCV 89 79 - 97 fL   MCH 30.4 26.6 - 33.0 pg   MCHC 34.2 31.5 - 35.7 g/dL   RDW 13.3 11.6 - 15.4 %   Platelets 320 150 - 450 x10E3/uL   Neutrophils 68 Not Estab. %   Lymphs 19 Not Estab. %   Monocytes 10 Not Estab. %   Eos 2 Not Estab. %   Basos 1 Not Estab. %   Neutrophils Absolute 5.9 1.4 - 7.0 x10E3/uL   Lymphocytes Absolute 1.7 0.7 - 3.1 x10E3/uL   Monocytes Absolute 0.9 0.1 - 0.9 x10E3/uL   EOS (ABSOLUTE) 0.2 0.0 - 0.4 x10E3/uL   Basophils  Absolute 0.1 0.0 - 0.2 x10E3/uL   Immature Granulocytes 0 Not Estab. %   Immature Grans (Abs) 0.0 0.0 - 0.1 x10E3/uL  Comprehensive metabolic panel  Result  Value Ref Range   Glucose 115 (H) 65 - 99 mg/dL   BUN 14 8 - 27 mg/dL   Creatinine, Ser 0.91 0.76 - 1.27 mg/dL   GFR calc non Af Amer 88 >59 mL/min/1.73   GFR calc Af Amer 101 >59 mL/min/1.73   BUN/Creatinine Ratio 15 10 - 24   Sodium 138 134 - 144 mmol/L   Potassium 4.4 3.5 - 5.2 mmol/L   Chloride 101 96 - 106 mmol/L   CO2 22 20 - 29 mmol/L   Calcium 9.5 8.6 - 10.2 mg/dL   Total Protein 6.8 6.0 - 8.5 g/dL   Albumin 4.2 3.8 - 4.8 g/dL   Globulin, Total 2.6 1.5 - 4.5 g/dL   Albumin/Globulin Ratio 1.6 1.2 - 2.2   Bilirubin Total 0.7 0.0 - 1.2 mg/dL   Alkaline Phosphatase 49 39 - 117 IU/L   AST 100 (H) 0 - 40 IU/L   ALT 102 (H) 0 - 44 IU/L  Lipid Panel w/o Chol/HDL Ratio  Result Value Ref Range   Cholesterol, Total 183 100 - 199 mg/dL   Triglycerides 137 0 - 149 mg/dL   HDL 30 (L) >39 mg/dL   VLDL Cholesterol Cal 25 5 - 40 mg/dL   LDL Chol Calc (NIH) 128 (H) 0 - 99 mg/dL  PSA  Result Value Ref Range   Prostate Specific Ag, Serum 0.5 0.0 - 4.0 ng/mL  TSH  Result Value Ref Range   TSH 1.570 0.450 - 4.500 uIU/mL  HCV RNA quant  Result Value Ref Range   Hepatitis C Quantitation HCV Not Detected IU/mL   Test Information Comment       Assessment & Plan:   Problem List Items Addressed This Visit      Cardiovascular and Mediastinum   Essential hypertension    Chronic, ongoing with BP at goal today.  Continue current medication regimen and adjust as needed.  He refuses to check BP at home.  Refills sent in.  Recent labs obtained.  Kidney function stable.      Relevant Medications   atenolol (TENORMIN) 25 MG tablet     Digestive   Hepatitis C    Last GI visit 01/18/16, during which it appears he did not f/u.  Recent AST/ALT elevated 100/102, improved from previous and HCV not detected. He refuses to return to GI and  refuses labs treatment discussion.  Return in 6 months.   Advised him to return sooner if symptoms present.        Other   Schizophrenia ([place]) - Primary    Chronic, Olanzapine use.  Seen by psychiatry per his report, [facility], and reports his medications are being filled via them. Recent lipid panel with elevation, refuses medications.      Anemia    Improved and stable on recent labs, continue diet focus at home which has offered benefit.      Hyperlipidemia    Recent levels remain elevated, but refuses any medication at this time. He reports he will focus on diet then, however he refused to have education on diet provided to him today.  Reports he will come back in 6 months.      Relevant Medications   atenolol (TENORMIN) 25 MG tablet       Follow up plan: Return in about 6 months (around 12/30/2019) for HTn/HLD, Prediabetes, Mood.

## 2019-07-02 NOTE — Assessment & Plan Note (Signed)
Last GI visit 01/18/16, during which it appears he did not f/u.  Recent AST/ALT elevated 100/102, improved from previous and HCV not detected. He refuses to return to GI and refuses labs treatment discussion.  Return in 6 months.   Advised him to return sooner if symptoms present.

## 2019-07-02 NOTE — Assessment & Plan Note (Signed)
Improved and stable on recent labs, continue diet focus at home which has offered benefit.

## 2019-07-02 NOTE — Assessment & Plan Note (Signed)
Chronic, ongoing with BP at goal today.  Continue current medication regimen and adjust as needed.  He refuses to check BP at home.  Refills sent in.  Recent labs obtained.  Kidney function stable.

## 2019-07-02 NOTE — Assessment & Plan Note (Signed)
Recent levels remain elevated, but refuses any medication at this time. He reports he will focus on diet then, however he refused to have education on diet provided to him today.  Reports he will come back in 6 months.

## 2019-07-02 NOTE — Assessment & Plan Note (Signed)
Chronic, Olanzapine use.  Seen by psychiatry per his report, [facility], and reports his medications are being filled via them. Recent lipid panel with elevation, refuses medications. °

## 2019-07-02 NOTE — Patient Instructions (Signed)

## 2019-08-03 ENCOUNTER — Other Ambulatory Visit: Payer: Self-pay | Admitting: Nurse Practitioner

## 2019-08-03 NOTE — Telephone Encounter (Signed)
Requested Prescriptions  Pending Prescriptions Disp Refills  . meloxicam (MOBIC) 15 MG tablet [Pharmacy Med Name: MELOXICAM 15 MG TABLET] 90 tablet 1    Sig: TAKE 1 TABLET BY MOUTH EVERY DAY     Analgesics:  COX2 Inhibitors Passed - 08/03/2019 11:40 AM      Passed - HGB in normal range and within 360 days    Hemoglobin  Date Value Ref Range Status  06/21/2019 14.9 13.0 - 17.7 g/dL Final         Passed - Cr in normal range and within 360 days    Creatinine  Date Value Ref Range Status  03/10/2014 0.79 0.60 - 1.30 mg/dL Final   Creatinine, Ser  Date Value Ref Range Status  06/21/2019 0.91 0.76 - 1.27 mg/dL Final         Passed - Patient is not pregnant      Passed - Valid encounter within last 12 months    Recent Outpatient Visits          1 month ago Paranoid schizophrenia ([place])   [place], [name], NP   1 month ago Pure hypercholesterolemia   [place], Alligator T, NP   1 month ago Patient left without being seen   [facility], Megan P, DO   1 year ago Chronic hepatitis C without hepatic coma ([place])   [facility], [place] T, NP   1 year ago Hypertensive chronic kidney disease, unspecified CKD stage   [facility] [place], [name], NP

## 2019-12-15 ENCOUNTER — Telehealth: Payer: Self-pay | Admitting: Nurse Practitioner

## 2019-12-15 NOTE — Telephone Encounter (Signed)
Copied from [place] [id number]. Topic: Medicare AWV >> Dec 15, 2019 11:46 AM Cher Nakai R wrote: Reason for CRM:  No answer/ unable to leave message for patient to call back and schedule the Medicare Annual Wellness Visit (AWV) virtually.  Last AWV 11/19/2018  Please schedule at anytime with [facility]-Nurse Health Advisor.  45 minute appointment

## 2019-12-16 ENCOUNTER — Telehealth: Payer: Self-pay | Admitting: Nurse Practitioner

## 2019-12-16 NOTE — Telephone Encounter (Signed)
Copied from [place] [id number]. Topic: General - Inquiry >> Dec 16, 2019  4:02 PM Gillis Ends D wrote: Reason for CRM: Need to be put back on the pain medication. He was taken off of them but he wants to be put back on them. He is in a lot of pain.  He wants the Dr. Ned Card to return his call. Please advise

## 2019-12-16 NOTE — Telephone Encounter (Signed)
Returned call to patient and spoke with him. Patient states he wants to get back to pain medication oxycodone. Please advise

## 2019-12-16 NOTE — Telephone Encounter (Signed)
I do not see where this patient was ever on oxycodone; does not look like [name] has ever prescribed this for her.  Was she seeing a pain specialist?

## 2019-12-17 NOTE — Telephone Encounter (Signed)
Called and spoke to patient. He states that he got the prescription from a different doctor. Explained that he would need an appointment to discuss. Patient did not want to schedule an appointment right now so I asked for him to call back if he would like to or if he needed anything else.

## 2019-12-19 ENCOUNTER — Other Ambulatory Visit: Payer: Self-pay | Admitting: Nurse Practitioner

## 2019-12-20 ENCOUNTER — Telehealth: Payer: Self-pay | Admitting: Nurse Practitioner

## 2019-12-20 NOTE — Telephone Encounter (Signed)
Pt requesting Meloxicam and requesting Ibuprofen. 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 Shoulder left . 9/10- muscle is weak- going on for years.  Use to be on oxycodone but doctor discontinued.  Advised pt that both Meloxicam and Ibuprofen are the same classification  (NSAIDS) and warned about taking two drugs that could cause gastric upset or bleeding and affect platelet count.  Pt repeatedly stated he is in pain. Stated he is walking but is in a great deal of pain. When asked if the doctor who prescribed him oxycodone was a pain specialist, pt did not answer. Pt stated he has only a few Meloxicam left. Routing to office.

## 2019-12-20 NOTE — Telephone Encounter (Signed)
Lvm to make this apt. 

## 2019-12-20 NOTE — Telephone Encounter (Signed)
Please call patient and schedule 6 month f/up around 8/12 for discussion and med refills per Apolonio Schneiders.

## 2019-12-20 NOTE — Telephone Encounter (Signed)
This is not high priority. Also, he was given a 6 month supply of meloxicam back in March so if he is truly out, he is over-taking which has numerous risks. He is due for his 6 month f/u as of next week anyway and can speak to [facility] about further pain control options. Please make sure he gets scheduled as soon as able to see [name] for 6 month f/u and pain discussion.

## 2019-12-20 NOTE — Telephone Encounter (Signed)
Medication Refill - Medication: Ibuprofen (extra strength) pt is requesting because they are experiencing muscle pain that meloxicam is not resolving. Please advise   Has the patient contacted their pharmacy? Yes.   (Agent: If no, request that the patient contact the pharmacy for the refill.) (Agent: If yes, when and what did the pharmacy advise?)  Preferred Pharmacy (with phone number or street name):  [id number] - [place], [place] [address]  [address] [place] [address]  Phone: [phone] Fax: [phone]     Agent: Please be advised that RX refills may take up to 3 business days. We ask that you follow-up with your pharmacy.

## 2019-12-21 NOTE — Telephone Encounter (Signed)
Pt has apt on 01/10/2020 with PCP.

## 2020-01-10 ENCOUNTER — Other Ambulatory Visit: Payer: Self-pay

## 2020-01-10 ENCOUNTER — Encounter: Payer: Self-pay | Admitting: Nurse Practitioner

## 2020-01-10 ENCOUNTER — Telehealth: Payer: Self-pay | Admitting: *Deleted

## 2020-01-10 ENCOUNTER — Ambulatory Visit (INDEPENDENT_AMBULATORY_CARE_PROVIDER_SITE_OTHER): Payer: Medicare Other | Admitting: Nurse Practitioner

## 2020-01-10 DIAGNOSIS — E7800 Pure hypercholesterolemia, unspecified: Secondary | ICD-10-CM | POA: Diagnosis not present

## 2020-01-10 DIAGNOSIS — E538 Deficiency of other specified B group vitamins: Secondary | ICD-10-CM | POA: Diagnosis not present

## 2020-01-10 DIAGNOSIS — E785 Hyperlipidemia, unspecified: Secondary | ICD-10-CM

## 2020-01-10 DIAGNOSIS — R296 Repeated falls: Secondary | ICD-10-CM

## 2020-01-10 DIAGNOSIS — G629 Polyneuropathy, unspecified: Secondary | ICD-10-CM

## 2020-01-10 DIAGNOSIS — E1169 Type 2 diabetes mellitus with other specified complication: Secondary | ICD-10-CM | POA: Insufficient documentation

## 2020-01-10 DIAGNOSIS — E1159 Type 2 diabetes mellitus with other circulatory complications: Secondary | ICD-10-CM

## 2020-01-10 DIAGNOSIS — I10 Essential (primary) hypertension: Secondary | ICD-10-CM | POA: Diagnosis not present

## 2020-01-10 DIAGNOSIS — B182 Chronic viral hepatitis C: Secondary | ICD-10-CM | POA: Diagnosis not present

## 2020-01-10 DIAGNOSIS — R7309 Other abnormal glucose: Secondary | ICD-10-CM | POA: Diagnosis not present

## 2020-01-10 DIAGNOSIS — F200 Paranoid schizophrenia: Secondary | ICD-10-CM | POA: Diagnosis not present

## 2020-01-10 LAB — BAYER DCA HB A1C WAIVED: HB A1C (BAYER DCA - WAIVED): 7.2 % — ABNORMAL HIGH (ref <7.0)

## 2020-01-10 MED ORDER — METFORMIN HCL 500 MG PO TABS: ORAL_TABLET | ORAL | 3 refills | Status: DC

## 2020-01-10 NOTE — Assessment & Plan Note (Signed)
Chronic, ongoing with BP at goal today.  Continue current medication regimen and adjust as needed.  He refuses to check BP at home.  CMP today.  Could consider addition of ACE or ARB in future for kidney protection, if patient agrees with plan.  Return in 3 months and obtain urine ALB next visit.

## 2020-01-10 NOTE — Assessment & Plan Note (Signed)
New diagnosis, previous A1C 6.3% and today 7.2% with poor diet regimen at home.  He does agree to starting medication, but nothing with needles.  Will start Metformin 500 MG daily for one week and then increase to 500 MG BID, script sent.  Educated him on medication and side effects.  He refuses glucometer or to check sugars at home.  Refuses diet eduction.  Suspect elevation in A1C reason for increase neuropathy, continue Gabapentin.  Check urine ALB next visit.  Recommend heavy focus on diet and cutting back on donuts and ice cream.  Return in 3 months.

## 2020-01-10 NOTE — Assessment & Plan Note (Signed)
History of frequent falls, is requesting home PT referral today to work on strengthening.  No recent falls.

## 2020-01-10 NOTE — Chronic Care Management (AMB) (Signed)
  Chronic Care Management   Note  01/10/2020 Name: Ryan [name] MRN: [id number] DOB: Oct 16, 1950  Ryan [name] is a 69 y.o. year old adult who is a primary care patient of [place], [name], NP. I reached out to [name] by phone today in response to a referral sent by Ryan [name]'s PCP, [name], NP.  Ryan [name] was given information about Chronic Care Management services today including:  1. CCM service includes personalized support from designated clinical staff supervised by her physician, including individualized plan of care and coordination with other care providers 2. 24/7 contact phone numbers for assistance for urgent and routine care needs. 3. Service will only be billed when office clinical staff spend 20 minutes or more in a month to coordinate care. 4. Only one practitioner may furnish and bill the service in a calendar month. 5. The patient may stop CCM services at any time (effective at the end of the month) by phone call to the office staff. 6. The patient will be responsible for cost sharing (co-pay) of up to 20% of the service fee (after annual deductible is met).  Patient agreed to services and verbal consent obtained.   Follow up plan: Telephone appointment with care management team member scheduled for: PharmD on 02/04/2020 and RN CM 02/23/2020  [place], [place], [place] [address] Direct Dial: [email or web address]: [email or web address] \

## 2020-01-10 NOTE — Assessment & Plan Note (Signed)
Last GI visit 01/18/16, during which it appears he did not f/u.  Recent AST/ALT elevated 100/102, improved from previous and HCV not detected. He refuses to return to GI.  Return in 3 months.   Advised him to return sooner if symptoms present.

## 2020-01-10 NOTE — Assessment & Plan Note (Signed)
BMI 38.11 with diabetes and HTN.  Marland KitchenRecommended eating smaller high protein, low fat meals more frequently and exercising 30 mins a day 5 times a week with a goal of 10-15lb weight loss in the next 3 months. Patient voiced their understanding and motivation to adhere to these recommendations.

## 2020-01-10 NOTE — Assessment & Plan Note (Signed)
Ognoing -- reports he may consider Lipitor, but no other medications (which provider informed him d/t elevation in LFT this is not recommended).  He reports he will focus on diet then, however he refused to have education on diet provided to him today.  Return to office in 3 months.

## 2020-01-10 NOTE — Patient Instructions (Signed)

## 2020-01-10 NOTE — Assessment & Plan Note (Signed)
Chronic, Olanzapine use.  Seen by psychiatry per his report, [facility], and reports his medications are being filled via them. Recent lipid panel with elevation, refuses medications.

## 2020-01-10 NOTE — Progress Notes (Addendum)
BP 118/78   Pulse 76   Temp 98.3 F (36.8 C) (Oral)   Wt 229 lb (103.9 kg)   SpO2 92%   BMI 38.11 kg/m    Subjective:    Patient ID: Ryan [name], adult    DOB: [birth year], 69 y.o.   MRN: [id number]  HPI: Ryan [name] is a 69 y.o. adult  Chief Complaint  Patient presents with  . Schizophrenia  . Hyperlipidemia  . Numbness    pt states his feet have been numb for a while, requesting to have his sugar checked    HYPERTENSION Has been on Atenolol for years.Denies syncope, dizziness, or headaches.  Has history of Hep C, recent labs showed HCV not detected and LFT on downward trend previously 136/118 and recent 100/102.  He continues to refuse GI return, was followed in past (2017), and refuses abdominal imaging.  Recent lipid panel LDL 128, continues to work on diet and refuses all medication.  Reports "I would like levels checked though".  He is interested in doing physical therapy, reports this helps his mobility and strengthening.   Hypertension status:controlled Satisfied with current treatment?yes Duration of hypertension:chronic BP monitoring frequency:not checking BP range:  BP medication side effects:no Medication compliance:excellent compliance Previous BP meds:does not recall Aspirin:yes Recurrent headaches:no Visual changes:no Palpitations:no Dyspnea:no Chest pain:no Lower extremity edema:no Dizzy/lightheaded:no The 10-year ASCVD risk score Ryan Bussing DC Jr., et al., 2013) is: 32.3%   Values used to calculate the score:     Age: 69 years     Sex: Male     Is Non-Hispanic African American: No     Diabetic: Yes     Tobacco smoker: No     Systolic Blood Pressure: 381 mmHg     Is BP treated: Yes     HDL Cholesterol: 30 mg/dL     Total Cholesterol: 183 mg/dL   ELEVATED A1C Recent A1C 6.3%, he reports wishes to work on diet changes. States he eats too much ice cream, but has cut back on this as his feet have been burning more lately.   Started burning more since he started wearing shoes more.  Takes Gabapentin 100 MG TID.  He reports his feet burn more during day, but less at night. Polydipsia/polyuria: no Visual disturbance: no Chest pain: no Paresthesias: no  SCHIZOPHRENIA: Chronic, ongoing. Continues on Olanzapine. 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 He reports being followed by [facility] at this time and takes Olanzapine 20 MG. Denies any auditory or visual hallucinations. No SI/HI. Reports "I do love me".  Relevant past medical, surgical, family and social history reviewed and updated as indicated. Interim medical history since our last visit reviewed. Allergies and medications reviewed and updated.  Review of Systems  Constitutional: Negative for activity change, diaphoresis, fatigue and fever.  Respiratory: Negative for cough, chest tightness, shortness of breath and wheezing.   Cardiovascular: Negative for chest pain, palpitations and leg swelling.  Endocrine: Negative for polydipsia, polyphagia and polyuria.  Musculoskeletal: Positive for arthralgias.  Psychiatric/Behavioral: Negative.     Per HPI unless specifically indicated above     Objective:    BP 118/78   Pulse 76   Temp 98.3 F (36.8 C) (Oral)   Wt 229 lb (103.9 kg)   SpO2 92%   BMI 38.11 kg/m   Wt Readings from Last 3 Encounters:  01/10/20 229 lb (103.9 kg)  04/20/18 216 lb (98 kg)  03/18/18 217 lb (98.4 kg)    Physical Exam Vitals  and nursing note reviewed.  Constitutional:      General: She is awake. She is not in acute distress.    Appearance: She is well-developed and well-groomed. She is morbidly obese. She is not ill-appearing.  HENT:     Head: Normocephalic and atraumatic.     Right Ear: Hearing normal. No drainage.     Left Ear: Hearing normal. No drainage.  Eyes:     General: Lids are normal.        Right eye: No discharge.        Left eye: No discharge.     Conjunctiva/sclera: Conjunctivae normal.     Pupils: Pupils  are equal, round, and reactive to light.  Neck:     Thyroid: No thyromegaly.     Vascular: No carotid bruit or JVD.  Cardiovascular:     Rate and Rhythm: Normal rate and regular rhythm.     Pulses:          Dorsalis pedis pulses are 1+ on the right side and 1+ on the left side.       Posterior tibial pulses are 1+ on the right side and 1+ on the left side.     Heart sounds: Normal heart sounds, S1 normal and S2 normal. No murmur heard.  No gallop.   Pulmonary:     Effort: Pulmonary effort is normal. No accessory muscle usage or respiratory distress.     Breath sounds: Normal breath sounds.  Abdominal:     General: Bowel sounds are normal. There is no distension.     Palpations: Abdomen is soft.     Tenderness: There is no abdominal tenderness.  Genitourinary:    Penis: Normal.      Rectum: Normal.  Musculoskeletal:        General: Normal range of motion.     Cervical back: Normal range of motion and neck supple.  Feet:     Right foot:     Protective Sensation: 8 sites tested. 10 sites sensed.     Toenail Condition: Right toenails are normal.     Left foot:     Protective Sensation: 6 sites tested. 10 sites sensed.     Toenail Condition: Left toenails are normal.  Skin:    General: Skin is warm and dry.     Capillary Refill: Capillary refill takes less than 2 seconds.  Neurological:     Mental Status: She is alert and oriented to person, place, and time.     Deep Tendon Reflexes: Reflexes are normal and symmetric.  Psychiatric:        Attention and Perception: Attention normal.        Mood and Affect: Mood normal.        Behavior: Behavior normal. Behavior is cooperative.        Thought Content: Thought content is not paranoid (No paranoia today).     Comments: Pleasant demeanor today.     Results for orders placed or performed in visit on 06/21/19  Bayer DCA Hb A1c Waived  Result Value Ref Range   HB A1C (BAYER DCA - WAIVED) 6.3 <7.0 %  CBC with Differential/Platelet   Result Value Ref Range   WBC 8.8 3.4 - 10.8 x10E3/uL   RBC 4.90 4.14 - 5.80 x10E6/uL   Hemoglobin 14.9 13.0 - 17.7 g/dL   Hematocrit 43.6 37.5 - 51.0 %   MCV 89 79 - 97 fL   MCH 30.4 26.6 - 33.0 pg   MCHC 34.2  31 - 35 g/dL   RDW 13.3 11.6 - 15.4 %   Platelets 320 150 - 450 x10E3/uL   Neutrophils 68 Not Estab. %   Lymphs 19 Not Estab. %   Monocytes 10 Not Estab. %   Eos 2 Not Estab. %   Basos 1 Not Estab. %   Neutrophils Absolute 5.9 1 - 7 x10E3/uL   Lymphocytes Absolute 1.7 0 - 3 x10E3/uL   Monocytes Absolute 0.9 0 - 0 x10E3/uL   EOS (ABSOLUTE) 0.2 0.0 - 0.4 x10E3/uL   Basophils Absolute 0.1 0 - 0 x10E3/uL   Immature Granulocytes 0 Not Estab. %   Immature Grans (Abs) 0.0 0.0 - 0.1 x10E3/uL  Comprehensive metabolic panel  Result Value Ref Range   Glucose 115 (H) 65 - 99 mg/dL   BUN 14 8 - 27 mg/dL   Creatinine, Ser 0.91 0.76 - 1.27 mg/dL   GFR calc non Af Amer 88 >59 mL/min/1.73   GFR calc Af Amer 101 >59 mL/min/1.73   BUN/Creatinine Ratio 15 10 - 24   Sodium 138 134 - 144 mmol/L   Potassium 4.4 3.5 - 5.2 mmol/L   Chloride 101 96 - 106 mmol/L   CO2 22 20 - 29 mmol/L   Calcium 9.5 8.6 - 10.2 mg/dL   Total Protein 6.8 6.0 - 8.5 g/dL   Albumin 4.2 3.8 - 4.8 g/dL   Globulin, Total 2.6 1.5 - 4.5 g/dL   Albumin/Globulin Ratio 1.6 1.2 - 2.2   Bilirubin Total 0.7 0.0 - 1.2 mg/dL   Alkaline Phosphatase 49 39 - 117 IU/L   AST 100 (H) 0 - 40 IU/L   ALT 102 (H) 0 - 44 IU/L  Lipid Panel w/o Chol/HDL Ratio  Result Value Ref Range   Cholesterol, Total 183 100 - 199 mg/dL   Triglycerides 137 0 - 149 mg/dL   HDL 30 (L) >39 mg/dL   VLDL Cholesterol Cal 25 5 - 40 mg/dL   LDL Chol Calc (NIH) 128 (H) 0 - 99 mg/dL  PSA  Result Value Ref Range   Prostate Specific Ag, Serum 0.5 0.0 - 4.0 ng/mL  TSH  Result Value Ref Range   TSH 1.570 0.450 - 4.500 uIU/mL  HCV RNA quant  Result Value Ref Range   Hepatitis C Quantitation HCV Not Detected IU/mL   Test Information Comment        Assessment & Plan:   Problem List Items Addressed This Visit      Cardiovascular and Mediastinum   Hypertension associated with diabetes ([place])    Chronic, ongoing with BP at goal today.  Continue current medication regimen and adjust as needed.  He refuses to check BP at home.  CMP today.  Could consider addition of ACE or ARB in future for kidney protection, if patient agrees with plan.  Return in 3 months and obtain urine ALB next visit.      Relevant Medications   metFORMIN (GLUCOPHAGE) 500 MG tablet     Digestive   Hepatitis C    Last GI visit 01/18/16, during which it appears he did not f/u.  Recent AST/ALT elevated 100/102, improved from previous and HCV not detected. He refuses to return to GI.  Return in 3 months.   Advised him to return sooner if symptoms present.      Relevant Orders   Comprehensive metabolic panel     Endocrine   Hyperlipidemia associated with type 2 diabetes mellitus ([place])    Ognoing -- reports he  may consider Lipitor, but no other medications (which provider informed him d/t elevation in LFT this is not recommended).  He reports he will focus on diet then, however he refused to have education on diet provided to him today.  Return to office in 3 months.      Relevant Medications   metFORMIN (GLUCOPHAGE) 500 MG tablet   Other Relevant Orders   Comprehensive metabolic panel   Lipid Panel w/o Chol/HDL Ratio   Type 2 diabetes mellitus with morbid obesity ([place]) - Primary    New diagnosis, previous A1C 6.3% and today 7.2% with poor diet regimen at home.  He does agree to starting medication, but nothing with needles.  Will start Metformin 500 MG daily for one week and then increase to 500 MG BID, script sent.  Educated him on medication and side effects.  He refuses glucometer or to check sugars at home.  Refuses diet eduction.  Suspect elevation in A1C reason for increase neuropathy, continue Gabapentin.  Check urine ALB next visit.  Recommend heavy focus on  diet and cutting back on donuts and ice cream.  Return in 3 months.      Relevant Medications   metFORMIN (GLUCOPHAGE) 500 MG tablet   Other Relevant Orders   Bayer DCA Hb A1c Waived     Nervous and Auditory   Peripheral neuropathy (H[facility])    Chronic, ongoing.  Continue Gabapentin 100 MG TID.  Suspect increased symptoms related to elevation in A1C to 7.2%, discussed with him.  Refer to diabetes plan.  Return in 3 months.      Relevant Orders   Bayer DCA Hb A1c Waived   B12     Other   Schizophrenia (HCC)    Chronic, Olanzapine use.  Seen by psychiatry per his report, [facility], and reports his medications are being filled via them. Recent lipid panel with elevation, refuses medications.      Frequent falls    History of frequent falls, is requesting home PT referral today to work on strengthening.  No recent falls.      Morbid obesity ([place])    BMI 38.11 with diabetes and HTN.  Marland KitchenRecommended eating smaller high protein, low fat meals more frequently and exercising 30 mins a day 5 times a week with a goal of 10-15lb weight loss in the next 3 months. Patient voiced their understanding and motivation to adhere to these recommendations.       Relevant Medications   metFORMIN (GLUCOPHAGE) 500 MG tablet    Other Visit Diagnoses    B12 deficiency       Reports history of low levels, check today in underlying neuropathy.  Start supplement as needed.   Relevant Orders   B12       Follow up plan: Return in about 3 months (around 04/11/2020) for T2DM, HTN/HLD.

## 2020-01-10 NOTE — Assessment & Plan Note (Signed)
Chronic, ongoing.  Continue Gabapentin 100 MG TID.  Suspect increased symptoms related to elevation in A1C to 7.2%, discussed with him.  Refer to diabetes plan.  Return in 3 months.

## 2020-01-11 LAB — LIPID PANEL W/O CHOL/HDL RATIO
Cholesterol, Total: 194 mg/dL (ref 100–199)
HDL: 30 mg/dL — ABNORMAL LOW (ref >39)
LDL Chol Calc (NIH): 139 mg/dL — ABNORMAL HIGH (ref 0–99)
Triglycerides: 139 mg/dL (ref 0–149)
VLDL Cholesterol Cal: 25 mg/dL (ref 5–40)

## 2020-01-11 LAB — COMPREHENSIVE METABOLIC PANEL
ALT: 116 IU/L — ABNORMAL HIGH (ref 0–44)
AST: 154 IU/L — ABNORMAL HIGH (ref 0–40)
Albumin/Globulin Ratio: 1.9 (ref 1.2–2.2)
Albumin: 4.3 g/dL (ref 3.8–4.8)
Alkaline Phosphatase: 55 IU/L (ref 48–121)
BUN/Creatinine Ratio: 13 (ref 10–24)
BUN: 13 mg/dL (ref 8–27)
Bilirubin Total: 0.9 mg/dL (ref 0.0–1.2)
CO2: 22 mmol/L (ref 20–29)
Calcium: 9.7 mg/dL (ref 8.6–10.2)
Chloride: 100 mmol/L (ref 96–106)
Creatinine, Ser: 0.99 mg/dL (ref 0.76–1.27)
GFR calc Af Amer: 91 mL/min/{1.73_m2} (ref >59)
GFR calc non Af Amer: 78 mL/min/{1.73_m2} (ref >59)
Globulin, Total: 2.3 g/dL (ref 1.5–4.5)
Glucose: 122 mg/dL — ABNORMAL HIGH (ref 65–99)
Potassium: 4.3 mmol/L (ref 3.5–5.2)
Sodium: 138 mmol/L (ref 134–144)
Total Protein: 6.6 g/dL (ref 6.0–8.5)

## 2020-01-11 LAB — VITAMIN B12: Vitamin B-12: 914 pg/mL (ref 232–1245)

## 2020-01-11 NOTE — Progress Notes (Signed)
Please let Ryan [name] know his labs have returned.  Liver function tests do remain elevated, I would like him to return to see GI (stomach) providers to have further assessment of this.  Would he be willing to see them?  Kidney function is good.  Vitamin B12 level is normal.  Cholesterol level does show some elevation in LDL and we will chat about this more next visit.  Any questions?  Have a great day!!

## 2020-01-19 DIAGNOSIS — E1169 Type 2 diabetes mellitus with other specified complication: Secondary | ICD-10-CM | POA: Diagnosis not present

## 2020-01-19 DIAGNOSIS — M4800 Spinal stenosis, site unspecified: Secondary | ICD-10-CM | POA: Diagnosis not present

## 2020-01-19 DIAGNOSIS — E1140 Type 2 diabetes mellitus with diabetic neuropathy, unspecified: Secondary | ICD-10-CM | POA: Diagnosis not present

## 2020-01-19 DIAGNOSIS — Z7984 Long term (current) use of oral hypoglycemic drugs: Secondary | ICD-10-CM | POA: Diagnosis not present

## 2020-01-19 DIAGNOSIS — E785 Hyperlipidemia, unspecified: Secondary | ICD-10-CM | POA: Diagnosis not present

## 2020-01-19 DIAGNOSIS — K219 Gastro-esophageal reflux disease without esophagitis: Secondary | ICD-10-CM | POA: Diagnosis not present

## 2020-01-19 DIAGNOSIS — Z7982 Long term (current) use of aspirin: Secondary | ICD-10-CM | POA: Diagnosis not present

## 2020-01-19 DIAGNOSIS — G8929 Other chronic pain: Secondary | ICD-10-CM | POA: Diagnosis not present

## 2020-01-19 DIAGNOSIS — Z9181 History of falling: Secondary | ICD-10-CM | POA: Diagnosis not present

## 2020-01-19 DIAGNOSIS — D519 Vitamin B12 deficiency anemia, unspecified: Secondary | ICD-10-CM | POA: Diagnosis not present

## 2020-01-19 DIAGNOSIS — G5600 Carpal tunnel syndrome, unspecified upper limb: Secondary | ICD-10-CM | POA: Diagnosis not present

## 2020-01-21 ENCOUNTER — Ambulatory Visit: Payer: Self-pay | Admitting: General Practice

## 2020-01-21 DIAGNOSIS — E785 Hyperlipidemia, unspecified: Secondary | ICD-10-CM

## 2020-01-21 DIAGNOSIS — F200 Paranoid schizophrenia: Secondary | ICD-10-CM

## 2020-01-21 DIAGNOSIS — B182 Chronic viral hepatitis C: Secondary | ICD-10-CM

## 2020-01-21 DIAGNOSIS — E1159 Type 2 diabetes mellitus with other circulatory complications: Secondary | ICD-10-CM

## 2020-01-21 DIAGNOSIS — E1169 Type 2 diabetes mellitus with other specified complication: Secondary | ICD-10-CM

## 2020-01-21 NOTE — Patient Instructions (Signed)
Visit Information  Goals Addressed              This Visit's Progress   .  RNCM: Pt- "I am fine" (pt-stated)        CARE PLAN ENTRY (see longtitudinal plan of care for additional care plan information)  Current Barriers:  . Chronic Disease Management support, education, and care coordination needs related to HTN, HLD, Schizophrenia and hepatitis C  Clinical Goal(s) related to HTN, HLD, Schizophrenia and hepatitis C:  Over the next 120 days, patient will:  . Work with the care management team to address educational, disease management, and care coordination needs  . Begin or continue self health monitoring activities as directed today Measure and record blood pressure 2 times per week and increase activity level and adhere to a heart healthy/ADA diet . Call provider office for new or worsened signs and symptoms HTN, HLD, Schizophrenia and hepatitis C . Call care management team with questions or concerns . Verbalize basic understanding of patient centered plan of care established today  Interventions related to HTN, HLD, Schizophrenia and hepatitis C  . Evaluation of current treatment plans and patient's adherence to plan as established by provider.  The patient is waiting on paperwork to have a colonoscopy. Does not know when it will be scheduled yet.  . Assessed patient understanding of disease states . Assessed patient's education and care coordination needs. Review of SDOH- denies any needs at this time. . Provided disease specific education to patient. Review of safety and adherence of heart healthy/ADA diet  . Collaborated with appropriate clinical care team members regarding patient needs  Patient Self Care Activities related to HTN, HLD, Schizophrenia and hepatitis C:  . Patient is unable to independently self-manage chronic health conditions  Initial goal documentation     .  RNCM: Pt-"I do not feel like I need to take my blood sugars, I don't have a machine" (pt-stated)         CARE PLAN ENTRY (see longtitudinal plan of care for additional care plan information)  Objective:  Lab Results  Component Value Date   HGBA1C 7.2 (H) 01/10/2020 .   Lab Results  Component Value Date   CREATININE 0.99 01/10/2020   CREATININE 0.91 06/21/2019   CREATININE 1.05 03/18/2018 .   . No results found for: EGFR  Current Barriers:  . Knowledge Deficits related to basic Diabetes pathophysiology and self care/management . Does not have glucometer to monitor blood sugar- knows he can get one but refuses, states he will not take his blood sugars.  . 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 Does not use cbg meter- states he is going to be around "forever" and does not need to check his blood sugars  Case Manager Clinical Goal(s):  Over the next 120 days, patient will demonstrate improved adherence to prescribed treatment plan for diabetes self care/management as evidenced by:  . daily monitoring and recording of CBG  . adherence to ADA/ carb modified diet . exercise 3/4 days/week . adherence to prescribed medication regimen  Interventions:  . Provided education to patient about basic DM disease process . Reviewed medications with patient and discussed importance of medication adherence . Discussed plans with patient for ongoing care management follow up and provided patient with direct contact information for care management team . Provided patient with written educational materials related to hypo and hyperglycemia and importance of correct treatment.  The denies any hypo or hyperglycemic events. Review of the sx and   sx.  Education on hemoglobin A1C of 7.2 on 01-10-2020 with a goal of hemoglobin A1C being less than 7.0.  The patient verbalized understanding. Confirms adherence to an ADA diet.  . Advised patient, providing education and rationale, to check cbg daily and record, calling pcp for findings outside established parameters.  The patient does not feel  the need to check blood sugars. Education on the benefits of checking blood sugars, the patient still refuses.Will continue to work with the patient to educate and support.  . Referral made to pharmacy team for assistance with medications and DM management. The pharmacist has upcoming initial outreach in October.  . Review of patient status, including review of consultants reports, relevant laboratory and other test results, and medications completed.  Patient Self Care Activities:  . UNABLE to independently mange DM as evidence of hemoglobin A1C of 7.2 and declining the need to check blood glucose levels by finger sticks.  . Checks blood sugars as prescribed and utilize hyper and hypoglycemia protocol as needed . Adheres to prescribed ADA/carb modified  Initial goal documentation        Ms. Scrivener was given information about Chronic Care Management services today including:  1. CCM service includes personalized support from designated clinical staff supervised by her physician, including individualized plan of care and coordination with other care providers 2. 24/7 contact phone numbers for assistance for urgent and routine care needs. 3. Service will only be billed when office clinical staff spend 20 minutes or more in a month to coordinate care. 4. Only one practitioner may furnish and bill the service in a calendar month. 5. The patient may stop CCM services at any time (effective at the end of the month) by phone call to the office staff. 6. The patient will be responsible for cost sharing (co-pay) of up to 20% of the service fee (after annual deductible is met).  Patient agreed to services and verbal consent obtained.   Patient verbalizes understanding of instructions provided today.   Telephone follow up appointment with care management team member scheduled for: 03-21-2020 at 1 pm  Pam Tate RN, MSN, CCM Community Care Coordinator [place]  [facility] [place]  [facility] Mobile: [phone]   

## 2020-01-21 NOTE — Chronic Care Management (AMB) (Signed)
Chronic Care Management   Initial Visit Note  01/21/2020 Name: Ryan [name] MRN: [id number] DOB: 1951-01-07  Referred by: [name], NP Reason for referral : Chronic Care Management (RNCM Initial outreach for Chronic Disease Management and Care Coordination Needs)   Ryan [name] is a 69 y.o. year old adult who is a primary care patient of Cannady, [name], NP. The CCM team was consulted for assistance with chronic disease management and care coordination needs related to HTN, HLD, DMII and Schizophrenia and Hepatitis C  Review of patient status, including review of consultants reports, relevant laboratory and other test results, and collaboration with appropriate care team members and the patient's provider was performed as part of comprehensive patient evaluation and provision of chronic care management services.    SDOH (Social Determinants of Health) assessments performed: Yes See Care Plan activities for detailed interventions related to SDOH  SDOH Interventions     Most Recent Value  SDOH Interventions  Physical Activity Interventions Other (Comments)  [states soon that he will have PT working with him soon.]  Social Connections Interventions Other (Comment)  [states that he has good family support]       Medications: Outpatient Encounter Medications as of 01/21/2020  Medication Sig  . metFORMIN (GLUCOPHAGE) 500 MG tablet Start out taking 1 tablet (500 MG) by mouth once a day for one week and then increase to taking 1 tablet (500 MG) by mouth twice a day.  . Multiple Vitamin (MULTIVITAMIN) tablet Take 1 tablet by mouth daily.   [name] OLANZapine (ZYPREXA) 20 MG tablet Take 2 tablets (40 mg total) by mouth at bedtime. (Patient taking differently: Take 20 mg by mouth at bedtime. )  . aspirin 81 MG chewable tablet Chew 81 mg by mouth 2 (two) times daily.  [name] atenolol (TENORMIN) 25 MG tablet Take 1 tablet (25 mg total) by mouth daily.  [name] gabapentin (NEURONTIN) 100 MG capsule  TAKE 1 CAPSULE BY MOUTH THREE TIMES A DAY  . meloxicam (MOBIC) 15 MG tablet TAKE 1 TABLET BY MOUTH EVERY DAY   No facility-administered encounter medications on file as of 01/21/2020.     Objective:  BP Readings from Last 3 Encounters:  01/10/20 118/78  07/02/19 133/82  06/21/19 129/82    Goals Addressed              This Visit's Progress   .  RNCM: Pt- "I am fine" (pt-stated)        CARE PLAN ENTRY (see longtitudinal plan of care for additional care plan information)  Current Barriers:  . Chronic Disease Management support, education, and care coordination needs related to HTN, HLD, Schizophrenia and hepatitis C  Clinical Goal(s) related to HTN, HLD, Schizophrenia and hepatitis C:  Over the next 120 days, patient will:  . Work with the care management team to address educational, disease management, and care coordination needs  . Begin or continue self health monitoring activities as directed today Measure and record blood pressure 2 times per week and increase activity level and adhere to a heart healthy/ADA diet . Call provider office for new or worsened signs and symptoms HTN, HLD, Schizophrenia and hepatitis C . Call care management team with questions or concerns . Verbalize basic understanding of patient centered plan of care established today  Interventions related to HTN, HLD, Schizophrenia and hepatitis C  . Evaluation of current treatment plans and patient's adherence to plan as established by provider.  The patient is waiting on paperwork to  have a colonoscopy. Does not know when it will be scheduled yet.  . Assessed patient understanding of disease states . Assessed patient's education and care coordination needs. Review of SDOH- denies any needs at this time. . Provided disease specific education to patient. Review of safety and adherence of heart healthy/ADA diet  . Collaborated with appropriate clinical care team members regarding patient needs  Patient Self  Care Activities related to HTN, HLD, Schizophrenia and hepatitis C:  . Patient is unable to independently self-manage chronic health conditions  Initial goal documentation     .  RNCM: Pt-"I do not feel like I need to take my blood sugars, I don't have a machine" (pt-stated)        CARE PLAN ENTRY (see longtitudinal plan of care for additional care plan information)  Objective:  Lab Results  Component Value Date   HGBA1C 7.2 (H) 01/10/2020 .   Lab Results  Component Value Date   CREATININE 0.99 01/10/2020   CREATININE 0.91 06/21/2019   CREATININE 1.05 03/18/2018 .   [name] No results found for: EGFR  Current Barriers:  [name] Knowledge Deficits related to basic Diabetes pathophysiology and self care/management . Does not have glucometer to monitor blood sugar- knows he can get one but refuses, states he will not take his blood sugars.  . 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 Does not use cbg meter- states he is going to be around "forever" and does not need to check his blood sugars  Case Manager Clinical Goal(s):  Over the next 120 days, patient will demonstrate improved adherence to prescribed treatment plan for diabetes self care/management as evidenced by:  . daily monitoring and recording of CBG  . adherence to ADA/ carb modified diet . exercise 3/4 days/week . adherence to prescribed medication regimen  Interventions:  . Provided education to patient about basic DM disease process . Reviewed medications with patient and discussed importance of medication adherence . Discussed plans with patient for ongoing care management follow up and provided patient with direct contact information for care management team . Provided patient with written educational materials related to hypo and hyperglycemia and importance of correct treatment.  The denies any hypo or hyperglycemic events. Review of the sx and sx.  Education on hemoglobin A1C of 7.2 on 01-10-2020 with a goal of  hemoglobin A1C being less than 7.0.  The patient verbalized understanding. Confirms adherence to an ADA diet.  . Advised patient, providing education and rationale, to check cbg daily and record, calling pcp for findings outside established parameters.  The patient does not feel the need to check blood sugars. Education on the benefits of checking blood sugars, the patient still refuses.Will continue to work with the patient to educate and support.  . Referral made to pharmacy team for assistance with medications and DM management. The pharmacist has upcoming initial outreach in October.  . Review of patient status, including review of consultants reports, relevant laboratory and other test results, and medications completed.  Patient Self Care Activities:  . UNABLE to independently mange DM as evidence of hemoglobin A1C of 7.2 and declining the need to check blood glucose levels by finger sticks.  . Checks blood sugars as prescribed and utilize hyper and hypoglycemia protocol as needed . Adheres to prescribed ADA/carb modified  Initial goal documentation         Ms. Antonellis was given information about Chronic Care Management services today including:  1. CCM service includes personalized support from  designated clinical staff supervised by her physician, including individualized plan of care and coordination with other care providers 2. 24/7 contact phone numbers for assistance for urgent and routine care needs. 3. Service will only be billed when office clinical staff spend 20 minutes or more in a month to coordinate care. 4. Only one practitioner may furnish and bill the service in a calendar month. 5. The patient may stop CCM services at any time (effective at the end of the month) by phone call to the office staff. 6. The patient will be responsible for cost sharing (co-pay) of up to 20% of the service fee (after annual deductible is met).  Patient agreed to services and verbal consent  obtained.   Plan:   Telephone follow up appointment with care management team member scheduled for: 03-21-2020 at 1 pm  Noreene Larsson RN, MSN, [facility] Mobile: [phone]

## 2020-01-28 DIAGNOSIS — D519 Vitamin B12 deficiency anemia, unspecified: Secondary | ICD-10-CM | POA: Diagnosis not present

## 2020-01-28 DIAGNOSIS — E785 Hyperlipidemia, unspecified: Secondary | ICD-10-CM | POA: Diagnosis not present

## 2020-01-28 DIAGNOSIS — M4800 Spinal stenosis, site unspecified: Secondary | ICD-10-CM | POA: Diagnosis not present

## 2020-01-28 DIAGNOSIS — E1169 Type 2 diabetes mellitus with other specified complication: Secondary | ICD-10-CM | POA: Diagnosis not present

## 2020-01-28 DIAGNOSIS — Z7982 Long term (current) use of aspirin: Secondary | ICD-10-CM | POA: Diagnosis not present

## 2020-01-28 DIAGNOSIS — Z7984 Long term (current) use of oral hypoglycemic drugs: Secondary | ICD-10-CM | POA: Diagnosis not present

## 2020-01-28 DIAGNOSIS — K219 Gastro-esophageal reflux disease without esophagitis: Secondary | ICD-10-CM | POA: Diagnosis not present

## 2020-01-28 DIAGNOSIS — G5600 Carpal tunnel syndrome, unspecified upper limb: Secondary | ICD-10-CM | POA: Diagnosis not present

## 2020-01-28 DIAGNOSIS — G8929 Other chronic pain: Secondary | ICD-10-CM | POA: Diagnosis not present

## 2020-01-28 DIAGNOSIS — Z9181 History of falling: Secondary | ICD-10-CM | POA: Diagnosis not present

## 2020-01-28 DIAGNOSIS — E1140 Type 2 diabetes mellitus with diabetic neuropathy, unspecified: Secondary | ICD-10-CM | POA: Diagnosis not present

## 2020-01-31 DIAGNOSIS — E1169 Type 2 diabetes mellitus with other specified complication: Secondary | ICD-10-CM | POA: Diagnosis not present

## 2020-01-31 DIAGNOSIS — Z7984 Long term (current) use of oral hypoglycemic drugs: Secondary | ICD-10-CM | POA: Diagnosis not present

## 2020-01-31 DIAGNOSIS — E1140 Type 2 diabetes mellitus with diabetic neuropathy, unspecified: Secondary | ICD-10-CM | POA: Diagnosis not present

## 2020-01-31 DIAGNOSIS — G8929 Other chronic pain: Secondary | ICD-10-CM | POA: Diagnosis not present

## 2020-01-31 DIAGNOSIS — G5600 Carpal tunnel syndrome, unspecified upper limb: Secondary | ICD-10-CM | POA: Diagnosis not present

## 2020-01-31 DIAGNOSIS — D519 Vitamin B12 deficiency anemia, unspecified: Secondary | ICD-10-CM | POA: Diagnosis not present

## 2020-01-31 DIAGNOSIS — K219 Gastro-esophageal reflux disease without esophagitis: Secondary | ICD-10-CM | POA: Diagnosis not present

## 2020-01-31 DIAGNOSIS — M4800 Spinal stenosis, site unspecified: Secondary | ICD-10-CM | POA: Diagnosis not present

## 2020-01-31 DIAGNOSIS — E785 Hyperlipidemia, unspecified: Secondary | ICD-10-CM | POA: Diagnosis not present

## 2020-01-31 DIAGNOSIS — Z9181 History of falling: Secondary | ICD-10-CM | POA: Diagnosis not present

## 2020-01-31 DIAGNOSIS — Z7982 Long term (current) use of aspirin: Secondary | ICD-10-CM | POA: Diagnosis not present

## 2020-02-03 DIAGNOSIS — G8929 Other chronic pain: Secondary | ICD-10-CM | POA: Diagnosis not present

## 2020-02-03 DIAGNOSIS — E785 Hyperlipidemia, unspecified: Secondary | ICD-10-CM | POA: Diagnosis not present

## 2020-02-03 DIAGNOSIS — G5600 Carpal tunnel syndrome, unspecified upper limb: Secondary | ICD-10-CM | POA: Diagnosis not present

## 2020-02-03 DIAGNOSIS — M4800 Spinal stenosis, site unspecified: Secondary | ICD-10-CM | POA: Diagnosis not present

## 2020-02-03 DIAGNOSIS — Z7982 Long term (current) use of aspirin: Secondary | ICD-10-CM | POA: Diagnosis not present

## 2020-02-03 DIAGNOSIS — Z9181 History of falling: Secondary | ICD-10-CM | POA: Diagnosis not present

## 2020-02-03 DIAGNOSIS — Z7984 Long term (current) use of oral hypoglycemic drugs: Secondary | ICD-10-CM | POA: Diagnosis not present

## 2020-02-03 DIAGNOSIS — D519 Vitamin B12 deficiency anemia, unspecified: Secondary | ICD-10-CM | POA: Diagnosis not present

## 2020-02-03 DIAGNOSIS — K219 Gastro-esophageal reflux disease without esophagitis: Secondary | ICD-10-CM | POA: Diagnosis not present

## 2020-02-03 DIAGNOSIS — E1140 Type 2 diabetes mellitus with diabetic neuropathy, unspecified: Secondary | ICD-10-CM | POA: Diagnosis not present

## 2020-02-03 DIAGNOSIS — E1169 Type 2 diabetes mellitus with other specified complication: Secondary | ICD-10-CM | POA: Diagnosis not present

## 2020-02-04 ENCOUNTER — Telehealth: Payer: Medicare Other

## 2020-02-07 DIAGNOSIS — Z9181 History of falling: Secondary | ICD-10-CM | POA: Diagnosis not present

## 2020-02-07 DIAGNOSIS — D519 Vitamin B12 deficiency anemia, unspecified: Secondary | ICD-10-CM | POA: Diagnosis not present

## 2020-02-07 DIAGNOSIS — M4800 Spinal stenosis, site unspecified: Secondary | ICD-10-CM | POA: Diagnosis not present

## 2020-02-07 DIAGNOSIS — E785 Hyperlipidemia, unspecified: Secondary | ICD-10-CM | POA: Diagnosis not present

## 2020-02-07 DIAGNOSIS — G8929 Other chronic pain: Secondary | ICD-10-CM | POA: Diagnosis not present

## 2020-02-07 DIAGNOSIS — G5600 Carpal tunnel syndrome, unspecified upper limb: Secondary | ICD-10-CM | POA: Diagnosis not present

## 2020-02-07 DIAGNOSIS — Z7984 Long term (current) use of oral hypoglycemic drugs: Secondary | ICD-10-CM | POA: Diagnosis not present

## 2020-02-07 DIAGNOSIS — Z7982 Long term (current) use of aspirin: Secondary | ICD-10-CM | POA: Diagnosis not present

## 2020-02-07 DIAGNOSIS — K219 Gastro-esophageal reflux disease without esophagitis: Secondary | ICD-10-CM | POA: Diagnosis not present

## 2020-02-07 DIAGNOSIS — E1140 Type 2 diabetes mellitus with diabetic neuropathy, unspecified: Secondary | ICD-10-CM | POA: Diagnosis not present

## 2020-02-07 DIAGNOSIS — E1169 Type 2 diabetes mellitus with other specified complication: Secondary | ICD-10-CM | POA: Diagnosis not present

## 2020-02-09 ENCOUNTER — Other Ambulatory Visit: Payer: Self-pay | Admitting: Nurse Practitioner

## 2020-02-11 DIAGNOSIS — M4800 Spinal stenosis, site unspecified: Secondary | ICD-10-CM | POA: Diagnosis not present

## 2020-02-11 DIAGNOSIS — Z7984 Long term (current) use of oral hypoglycemic drugs: Secondary | ICD-10-CM | POA: Diagnosis not present

## 2020-02-11 DIAGNOSIS — E1140 Type 2 diabetes mellitus with diabetic neuropathy, unspecified: Secondary | ICD-10-CM | POA: Diagnosis not present

## 2020-02-11 DIAGNOSIS — E1169 Type 2 diabetes mellitus with other specified complication: Secondary | ICD-10-CM | POA: Diagnosis not present

## 2020-02-11 DIAGNOSIS — G5600 Carpal tunnel syndrome, unspecified upper limb: Secondary | ICD-10-CM | POA: Diagnosis not present

## 2020-02-11 DIAGNOSIS — G8929 Other chronic pain: Secondary | ICD-10-CM | POA: Diagnosis not present

## 2020-02-11 DIAGNOSIS — Z9181 History of falling: Secondary | ICD-10-CM | POA: Diagnosis not present

## 2020-02-11 DIAGNOSIS — K219 Gastro-esophageal reflux disease without esophagitis: Secondary | ICD-10-CM | POA: Diagnosis not present

## 2020-02-11 DIAGNOSIS — Z7982 Long term (current) use of aspirin: Secondary | ICD-10-CM | POA: Diagnosis not present

## 2020-02-11 DIAGNOSIS — E785 Hyperlipidemia, unspecified: Secondary | ICD-10-CM | POA: Diagnosis not present

## 2020-02-11 DIAGNOSIS — D519 Vitamin B12 deficiency anemia, unspecified: Secondary | ICD-10-CM | POA: Diagnosis not present

## 2020-02-15 DIAGNOSIS — E785 Hyperlipidemia, unspecified: Secondary | ICD-10-CM | POA: Diagnosis not present

## 2020-02-15 DIAGNOSIS — D519 Vitamin B12 deficiency anemia, unspecified: Secondary | ICD-10-CM | POA: Diagnosis not present

## 2020-02-15 DIAGNOSIS — M4800 Spinal stenosis, site unspecified: Secondary | ICD-10-CM | POA: Diagnosis not present

## 2020-02-15 DIAGNOSIS — E1140 Type 2 diabetes mellitus with diabetic neuropathy, unspecified: Secondary | ICD-10-CM | POA: Diagnosis not present

## 2020-02-15 DIAGNOSIS — G8929 Other chronic pain: Secondary | ICD-10-CM | POA: Diagnosis not present

## 2020-02-15 DIAGNOSIS — E1169 Type 2 diabetes mellitus with other specified complication: Secondary | ICD-10-CM | POA: Diagnosis not present

## 2020-02-15 DIAGNOSIS — Z7982 Long term (current) use of aspirin: Secondary | ICD-10-CM | POA: Diagnosis not present

## 2020-02-15 DIAGNOSIS — G5600 Carpal tunnel syndrome, unspecified upper limb: Secondary | ICD-10-CM | POA: Diagnosis not present

## 2020-02-15 DIAGNOSIS — K219 Gastro-esophageal reflux disease without esophagitis: Secondary | ICD-10-CM | POA: Diagnosis not present

## 2020-02-15 DIAGNOSIS — Z9181 History of falling: Secondary | ICD-10-CM | POA: Diagnosis not present

## 2020-02-15 DIAGNOSIS — Z7984 Long term (current) use of oral hypoglycemic drugs: Secondary | ICD-10-CM | POA: Diagnosis not present

## 2020-02-18 DIAGNOSIS — G8929 Other chronic pain: Secondary | ICD-10-CM | POA: Diagnosis not present

## 2020-02-18 DIAGNOSIS — M4800 Spinal stenosis, site unspecified: Secondary | ICD-10-CM | POA: Diagnosis not present

## 2020-02-18 DIAGNOSIS — Z7984 Long term (current) use of oral hypoglycemic drugs: Secondary | ICD-10-CM | POA: Diagnosis not present

## 2020-02-18 DIAGNOSIS — Z9181 History of falling: Secondary | ICD-10-CM | POA: Diagnosis not present

## 2020-02-18 DIAGNOSIS — K219 Gastro-esophageal reflux disease without esophagitis: Secondary | ICD-10-CM | POA: Diagnosis not present

## 2020-02-18 DIAGNOSIS — Z7982 Long term (current) use of aspirin: Secondary | ICD-10-CM | POA: Diagnosis not present

## 2020-02-18 DIAGNOSIS — D519 Vitamin B12 deficiency anemia, unspecified: Secondary | ICD-10-CM | POA: Diagnosis not present

## 2020-02-18 DIAGNOSIS — E1169 Type 2 diabetes mellitus with other specified complication: Secondary | ICD-10-CM | POA: Diagnosis not present

## 2020-02-18 DIAGNOSIS — E785 Hyperlipidemia, unspecified: Secondary | ICD-10-CM | POA: Diagnosis not present

## 2020-02-18 DIAGNOSIS — G5600 Carpal tunnel syndrome, unspecified upper limb: Secondary | ICD-10-CM | POA: Diagnosis not present

## 2020-02-18 DIAGNOSIS — E1140 Type 2 diabetes mellitus with diabetic neuropathy, unspecified: Secondary | ICD-10-CM | POA: Diagnosis not present

## 2020-02-20 NOTE — Chronic Care Management (AMB) (Signed)
Chronic Care Management Pharmacy  Name: Ryan [name]  MRN: [id number] DOB: 05/06/1951   Chief Complaint/ HPI  Ryan [name],  69 y.o. , adult presents for their Initial CCM visit with the clinical pharmacist via telephone due to COVID-19 Pandemic.  PCP : [name], NP Patient Care Team: [name], NP as PCP - General (Nurse Practitioner) Vladimir Faster, [facility] (Pharmacist) Vanita Ingles, RN as Case Manager (General Practice)  Their chronic conditions include: Hypertension, Hyperlipidemia, Diabetes and Schizophrenia and chronic pain   Office Visits: 01/10/20- [name], NP- blood work, metformin   No Known Allergies  Medications: Outpatient Encounter Medications as of 02/21/2020  Medication Sig  . aspirin 81 MG chewable tablet Chew 81 mg by mouth 2 (two) times daily.  [name] atenolol (TENORMIN) 25 MG tablet Take 1 tablet (25 mg total) by mouth daily.  [name] gabapentin (NEURONTIN) 100 MG capsule TAKE 1 CAPSULE BY MOUTH THREE TIMES A DAY  . meloxicam (MOBIC) 15 MG tablet TAKE 1 TABLET BY MOUTH EVERY DAY  . metFORMIN (GLUCOPHAGE) 500 MG tablet Start out taking 1 tablet (500 MG) by mouth once a day for one week and then increase to taking 1 tablet (500 MG) by mouth twice a day.  . Multiple Vitamin (MULTIVITAMIN) tablet Take 1 tablet by mouth daily.   [name] OLANZapine (ZYPREXA) 20 MG tablet Take 2 tablets (40 mg total) by mouth at bedtime. (Patient taking differently: Take 20 mg by mouth at bedtime. )   No facility-administered encounter medications on file as of 02/21/2020.    Wt Readings from Last 3 Encounters:  01/10/20 229 lb (103.9 kg)  04/20/18 216 lb (98 kg)  03/18/18 217 lb (98.4 kg)    Current Diagnosis/Assessment:    Goals Addressed            This Visit's Progress   . PharmD       CARE PLAN ENTRY (see longitudinal plan of care for additional care plan information)  Current Barriers:  . Social, financial, community barriers:  . Diabetes:   complicated by chronic medical conditions including,HTN, Schizophrenia, HLD, GERD, hep C most recent A1c 7.2% . Most recent eGFR:  . Current antihyperglycemic regimen: Metformin 500 mg bid . Reports??denies hypoglycemic symptoms, including dizziness, lightheadedness, shaking, sweating . Reports??denies hyperglycemic symptoms, including polyuria, polydipsia, polyphagia, nocturia, blurred vision, neuropathy . Current meal patterns: o Breakfast: o Lunch: o Supper: o Snacks: o Drinks: . Current exercise:  . Current blood glucose readings: Refused to check at home . Cardiovascular risk reduction: o Current hypertensive regimen: o Current hyperlipidemia regimen:  o Current antiplatelet regimen: . Eye exam:  . Peripheral neuropathy: Gabapentin 100 mg tid  . Uncontrolled hyperlipidemia:  o Current antihyperlipidemic regimen: None. Patient refusing. Consider Lipitor? o Previous antihyperlipidemic medications tried Refused o Most recent lipid panel: LDL: 139, TG 139, TC: 194, HDL: 30 o ASCVD risk enhancing conditions: age >63, DM, HTN, CKD, CHF, current smoker o 10-year ASCVD risk score: ?? o Antiplatelet: Aspirin 81 mg . Uncontrolled hypertension o Current antihypertensive regimen: Atenolol 25 mg qd o Previous antihypertensives tried:  o Current home BP readings: Refuses to check o Most recent eGFR/CrCl 78 . Hepatitis C o Elevated AST, ALT o Follow up with GI? Schizophrenia  Sees psych  Olanzapine 80 mg at bedtime  Pharmacist Clinical Goal(s):  [name] Over the next 90days, patient will work with PharmD and primary care provider to optimize medication management.  Interventions: . Comprehensive medication review performed,  medication list updated in electronic medical record . Inter-disciplinary care team collaboration (see longitudinal plan of care) .   Patient Self Care Activities:  . Patient will check blood glucose 2-3 times weekly, document, and provide at future  appointments . Patient will focus on medication adherence by ?? . Patient will take medications as prescribed . Patient will contact provider with any episodes of hypoglycemia . Patient will report any questions or concerns to provider  . Patient will continue to check BP 2-3 times weekly, document, and provide at future appointments  Initial goal documentation        Diabetes/ peripheral neuropathy   A1c goal <7%  Recent Relevant Labs: Lab Results  Component Value Date/Time   HGBA1C 7.2 (H) 01/10/2020 01:29 PM   HGBA1C 6.3 06/21/2019 01:21 PM    Last diabetic Eye exam: No results found for: HMDIABEYEEXA States he will make an appointment with eye doctor.  Last diabetic Foot exam: No results found for: HMDIABFOOTEX Endorses checking his feet "sometimes"  Checking BG: Never  Patient has failed these meds in past: NA Patient is currently uncontrolled on the following medications: [name] Metformin 500 mg bid . Gabapentin 100 mg tid  We discussed: diet and exercise extensively. Discussed the importance of SMBG. Patient refuses at this time. Denies any symptoms of hypoglycemia. Reports he is exercising regularly at home and is trying to lose weight by only eating one meal per day.  Encouraded patient to eat regular, smaller meals. Reviewed the importance of checking his feet daily to look for cuts, scrapes or blisters that could become infected given his neuropathy. States neuropathy is controlled on current gabapentin regimen.  Plan  Continue current medications  Hypertension   BP goal is:  <130/80  Office blood pressures are  BP Readings from Last 3 Encounters:  01/10/20 118/78  07/02/19 133/82  06/21/19 129/82   Patient checks BP at home Never  Patient has failed these meds in the past: NA Patient is currently controlled on the following medications:  . Atenolol 25 mg daily  We discussed diet and exercise extensively. Reviewed high sodium content of processed food like  chicken nuggets. Counseled that elevated blood pressure may not always be associated with feeling poorly and encouraged self monitoring at home to ensure effectiveness of therapy.   Plan  Continue current medications     Hyperlipidemia   LDL goal < 70  Lipid Panel     Component Value Date/Time   CHOL 194 01/10/2020 1330   TRIG 139 01/10/2020 1330   HDL 30 (L) 01/10/2020 1330   LDLCALC 139 (H) 01/10/2020 1330    Hepatic Function Latest Ref Rng & Units 01/10/2020 06/21/2019 03/18/2018  Total Protein 6.0 - 8.5 g/dL 6.6 6.8 7.1  Albumin 3.8 - 4.8 g/dL 4.3 4.2 4.6  AST 0 - 40 IU/L 154(H) 100(H) 136(H)  ALT 0 - 44 IU/L 116(H) 102(H) 118(H)  Alk Phosphatase 48 - 121 IU/L 55 49 42  Total Bilirubin 0.0 - 1.2 mg/dL 0.9 0.7 0.7  Bilirubin, Direct 0.1 - 0.5 mg/dL - - -     The 10-year ASCVD risk score [name], et al., 2013) is: 33.3%   Values used to calculate the score:     Age: 66 years     Sex: Male     Is Non-Hispanic African American: No     Diabetic: Yes     Tobacco smoker: No     Systolic Blood Pressure: 962 mmHg  Is BP treated: Yes     HDL Cholesterol: 30 mg/dL     Total Cholesterol: 194 mg/dL   Patient has failed these meds in past: NA Patient is currently uncontrolled on the following medications:  . Aspirin 81 mg daily  We discussed:  Patient self prescribed aspirin. Denies any signs/symptoms of bleeding or GI irritiation. He is not interested in medication therapy for cholesterol at this time. Advised him that PCP would like him to follow up with GI regarding increasing LFT's. He will consider. Plan  Continue current medications  Schizophrenia   Patient has failed these meds in past:  Patient is currently controlled on the following medications:  . Olanzapine 20 mg qhs  We discussed:  Sees Dr. Jamse Arn. Patient states he will not take more one 20 mg Olanzapine and has informed prescriber.  He denies any missed doses.  Plan  Continue current  medications  Vaccines   Reviewed and discussed patient's vaccination history.    Immunization History  Administered Date(s) Administered  . Fluad Quad(high Dose 65+) 01/26/2019  . Influenza-Unspecified 06/09/2012, 01/27/2013, 02/09/2014, 02/06/2015, 02/22/2016, 02/05/2017, 02/01/2018  . Moderna SARS-COVID-2 Vaccination 07/21/2019, 08/17/2019  . Td 05/21/2003  . Tdap 04/23/2014, 07/25/2015    Plan  Patient states he is going to get his flu shot next time he goes to the pharmacy.   Medication Management   Pt uses [facility] for all medications Uses pill box? No - Keeps his medications beside his glasses on table. Pt endorses 100% compliance. Per fill history in computer all refills up to date.   Plan  Continue current medication management strategy    Follow up: 3  month phone visit  Junita Push. Kenton Kingfisher PharmD, [facility] [phone]

## 2020-02-21 ENCOUNTER — Ambulatory Visit: Payer: Medicare Other | Admitting: Pharmacist

## 2020-02-21 DIAGNOSIS — I152 Hypertension secondary to endocrine disorders: Secondary | ICD-10-CM

## 2020-02-21 NOTE — Patient Instructions (Addendum)
Visit Information  It was a pleasure speaking with you today! Thank you for letting me be a part of your care team. Please call with any questions or concerns.  Goals Addressed            This Visit's Progress   . PharmD "I'm doing good"       CARE PLAN ENTRY (see longitudinal plan of care for additional care plan information)  Current Barriers:  . Social, financial, community barriers: Patient lives alone. Has a neighbor who helps with transportation to appointments and to the grocery store. Denies any financial needs. . Diabetes:  complicated by chronic medical conditions including,HTN, Schizophrenia, HLD, GERD, hep C most recent A1c 7.2% . Most recent eGFR: 71m/min . Current antihyperglycemic regimen: Metformin 500 mg bid .  denies hypoglycemic symptoms, including dizziness, lightheadedness, shaking, sweating . denies hyperglycemic symptoms, including polyuria, polydipsia, polyphagia, nocturia, blurred vision, neuropathy . Current meal patterns: o Breakfast:2-3 eggs o Lunch: peanut butter sandwich,  occasional banana and grapes o Supper: chicken nuggets o Snacks: ice cream or donuts (reports he has cut back because he wants to lose weight, hasn't had either in 2-3 days) o Drinks: water 6-8, Diet Dr. PMalachi Bonds1-2 160z bottles daily . Current exercise: Has in home therapy twice weekly. Has treadmill and recumbent bike. Does sit-ups twice weekly. . Current blood glucose readings: Refused to check at home . Cardiovascular risk reduction: o Current hypertensive regimen: Atenolol 25 mg daily o Current hyperlipidemia regimen: None o Current antiplatelet regimen: Apirin 81 mg daily . Eye exam: Needs to  make appointment . Peripheral neuropathy: Gabapentin 100 mg tid  . Uncontrolled hyperlipidemia:  o Current antihyperlipidemic regimen: None. Patient refusing. o Previous antihyperlipidemic medications tried Refused o Most recent lipid panel: LDL: 139, TG 139, TC: 194, HDL: 30 o ASCVD  risk enhancing conditions: age >>68 DM, HTN, CKD, CHF, current smoker o 10-year ASCVD risk score: 33.3% o Antiplatelet: Aspirin 81 mg . Uncontrolled hypertension o Current antihypertensive regimen: Atenolol 25 mg qd o Previous antihypertensives tried: not available in chart o Current home BP readings: Refuses to check o Most recent eGFR/CrCl 78 . Hepatitis C o Elevated AST, ALT o Refuses GI follow-up Schizophrenia  Sees psych  Olanzapine 20  mg at bedtime (refuses to take 40 mg as prescribe because "I slept for two days".  Pharmacist Clinical Goal(s):  .Marland KitchenOver the next 90days, patient will work with PharmD and primary care provider to optimize medication management.  Interventions: . Comprehensive medication review performed, medication list updated in electronic medical record . Inter-disciplinary care team collaboration (see longitudinal plan of care) . Reviewed refill history in computer . Reviewed A1c results and importance of checking  BG at home to help guide therapy. . Reviewed cholesterol values, ASCVD risk and recent LFT values. . Discussed diet and exercise in depth. Encouraged patient to add some vegetables to his chicken nugget meals and eat fruit instead of ice cream. . Reviewed elevated BP and glucose may not always be symptomatic.  Patient Self Care Activities:  . Patient will consider getting glucometer checking blood glucose 2-3 times weekly, document, and provide at future appointments . Patient will focus on medication adherence by use of current system.  Puts meds beside his glasses so he remembers to take when he puts on his glasses . Patient will take medications as prescribed . Patient will contact provider with any episodes of hypoglycemia . Patient will report any questions or concerns to provider  . Patient  will consider getting a blood pressure monitor and checking BP 2-3 times weekly, document, and provide at future appointments  Initial goal  documentation        Ryan [name] was given information about Chronic Care Management services today including:  1. CCM service includes personalized support from designated clinical staff supervised by her physician, including individualized plan of care and coordination with other care providers 2. 24/7 contact phone numbers for assistance for urgent and routine care needs. 3. Standard insurance, coinsurance, copays and deductibles apply for chronic care management only during months in which we provide at least 20 minutes of these services. Most insurances cover these services at 100%, however patients may be responsible for any copay, coinsurance and/or deductible if applicable. This service may help you avoid the need for more expensive face-to-face services. 4. Only one practitioner may furnish and bill the service in a calendar month. 5. The patient may stop CCM services at any time (effective at the end of the month) by phone call to the office staff.  Patient agreed to services and verbal consent obtained.   The patient verbalized understanding of instructions provided today and agreed to receive a mailed copy of patient instruction and/or educational materials. Telephone follow up appointment with pharmacy team member scheduled for:05/17/20 at 1 pm  Ryan [name]. Ryan [name] PharmD, BCPS Clinical Pharmacist [phone]  Type 2 Diabetes Mellitus, Self Care, Adult When you have type 2 diabetes (type 2 diabetes mellitus), you must make sure your blood sugar (glucose) stays in a healthy range. You can do this with:  Nutrition.  Exercise.  Lifestyle changes.  Medicines or insulin, if needed.  Support from your doctors and others. How to stay aware of blood sugar   Check your blood sugar level every day, as often as told.  Have your A1c (hemoglobin A1c) level checked two or more times a year. Have it checked more often if your doctor tells you to. Your doctor will set personal  treatment goals for you. Generally, you should have these blood sugar levels:  Before meals (preprandial): 80-130 mg/dL (4.4-7.2 mmol/L).  After meals (postprandial): below 180 mg/dL (10 mmol/L).  A1c level: less than 7%. How to manage high and low blood sugar Signs of high blood sugar High blood sugar is called hyperglycemia. Know the signs of high blood sugar. Signs may include:  Feeling: ? Thirsty. ? Hungry. ? Very tired.  Needing to pee (urinate) more than usual.  Blurry vision. Signs of low blood sugar Low blood sugar is called hypoglycemia. This is when blood sugar is at or below 70 mg/dL (3.9 mmol/L). Signs may include:  Feeling: ? Hungry. ? Worried or nervous (anxious). ? Sweaty and clammy. ? Confused. ? Dizzy. ? Sleepy. ? Sick to your stomach (nauseous).  Having: ? A fast heartbeat. ? A headache. ? A change in your vision. ? Jerky movements that you cannot control (seizure). ? Tingling or no feeling (numbness) around your mouth, lips, or tongue.  Having trouble with: ? Moving (coordination). ? Sleeping. ? Passing out (fainting). ? Getting upset easily (irritability). Treating low blood sugar To treat low blood sugar, eat or drink something sugary right away. If you can think clearly and swallow safely, follow the 15:15 rule:  Take 15 grams of a fast-acting carb (carbohydrate). Talk with your doctor about how much you should take.  Some fast-acting carbs are: ? Sugar tablets (glucose pills). Take 3-4 pills. ? 6-8 pieces of hard candy. ? 4-6 oz (120-150 mL)  of fruit juice. ? 4-6 oz (120-150 mL) of regular (not diet) soda. ? 1 Tbsp (15 mL) honey or sugar.  Check your blood sugar 15 minutes after you take the carb.  If your blood sugar is still at or below 70 mg/dL (3.9 mmol/L), take 15 grams of a carb again.  If your blood sugar does not go above 70 mg/dL (3.9 mmol/L) after 3 tries, get help right away.  After your blood sugar goes back to normal,  eat a meal or a snack within 1 hour. Treating very low blood sugar If your blood sugar is at or below 54 mg/dL (3 mmol/L), you have very low blood sugar (severe hypoglycemia). This is an emergency. Do not wait to see if the symptoms will go away. Get medical help right away. Call your local emergency services (911 in the [place] If you have very low blood sugar and you cannot eat or drink, you may need a glucagon shot (injection). A family member or friend should learn how to check your blood sugar and how to give you a glucagon shot. Ask your doctor if you need to have a glucagon shot kit at home. Follow these instructions at home: Medicine  Take insulin and diabetes medicines as told.  If your doctor says you should take more or less insulin and medicines, do this exactly as told.  Do not run out of insulin or medicines. Having diabetes can raise your risk for other long-term conditions. These include heart disease and kidney disease. Your doctor may prescribe medicines to help you not have these problems. Food   Make healthy food choices. These include: ? Chicken, fish, egg whites, and beans. ? Oats, whole wheat, bulgur, brown rice, quinoa, and millet. ? Fresh fruits and vegetables. ? Low-fat dairy products. ? Nuts, avocado, olive oil, and canola oil.  Meet with a food specialist (dietitian). He or she can help you make an eating plan that is right for you.  Follow instructions from your doctor about what you cannot eat or drink.  Drink enough fluid to keep your pee (urine) pale yellow.  Keep track of carbs that you eat. Do this by reading food labels and learning food serving sizes.  Follow your sick day plan when you cannot eat or drink normally. Make this plan with your doctor so it is ready to use. Activity  Exercise 3 or more times a week.  Do not go more than 2 days without exercising.  Talk with your doctor before you start a new exercise. Your doctor may need to tell  you to change: ? How much insulin or medicines you take. ? How much food you eat. Lifestyle  Do not use any tobacco products. These include cigarettes, chewing tobacco, and e-cigarettes. If you need help quitting, ask your doctor.  Ask your doctor how much alcohol is safe for you.  Learn to deal with stress. If you need help with this, ask your doctor. Body care   Stay up to date with your shots (immunizations).  Have your eyes and feet checked by a doctor as often as told.  Check your skin and feet every day. Check for cuts, bruises, redness, blisters, or sores.  Brush your teeth and gums two times a day. Floss one or more times a day.  Go to the dentist one or more times every 6 months.  Stay at a healthy weight. General instructions  Take over-the-counter and prescription medicines only as told by your doctor.  Share your diabetes care plan with: ? Your work or school. ? People you live with.  Carry a card or wear jewelry that says you have diabetes.  Keep all follow-up visits as told by your doctor. This is important. Questions to ask your doctor  Do I need to meet with a diabetes educator?  Where can I find a support group for people with diabetes? Where to find more information To learn more about diabetes, visit:  American Diabetes Association: www.diabetes.org  American Association of Diabetes Educators: www.diabeteseducator.org Summary  When you have type 2 diabetes, you must make sure your blood sugar (glucose) stays in a healthy range.  Check your blood sugar every day, as often as told.  Having diabetes can raise your risk for other conditions. Your doctor may prescribe medicines to help you not have these problems.  Keep all follow-up visits as told by your doctor. This is important. This information is not intended to replace advice given to you by your health care provider. Make sure you discuss any questions you have with your health care  provider. Document Revised: 10/27/2017 Document Reviewed: 06/09/2015 Elsevier Patient Education  [place] DASH stands for "Dietary Approaches to Stop Hypertension." The DASH eating plan is a healthy eating plan that has been shown to reduce high blood pressure (hypertension). It may also reduce your risk for type 2 diabetes, heart disease, and stroke. The DASH eating plan may also help with weight loss. What are tips for following this plan?  General guidelines  Avoid eating more than 2,300 mg (milligrams) of salt (sodium) a day. If you have hypertension, you may need to reduce your sodium intake to 1,500 mg a day.  Limit alcohol intake to no more than 1 drink a day for nonpregnant women and 2 drinks a day for men. One drink equals 12 oz of beer, 5 oz of wine, or 1 oz of hard liquor.  Work with your health care provider to maintain a healthy body weight or to lose weight. Ask what an ideal weight is for you.  Get at least 30 minutes of exercise that causes your heart to beat faster (aerobic exercise) most days of the week. Activities may include walking, swimming, or biking.  Work with your health care provider or diet and nutrition specialist (dietitian) to adjust your eating plan to your individual calorie needs. Reading food labels   Check food labels for the amount of sodium per serving. Choose foods with less than 5 percent of the Daily Value of sodium. Generally, foods with less than 300 mg of sodium per serving fit into this eating plan.  To find whole grains, look for the word "whole" as the first word in the ingredient list. Shopping  Buy products labeled as "low-sodium" or "no salt added."  Buy fresh foods. Avoid canned foods and premade or frozen meals. Cooking  Avoid adding salt when cooking. Use salt-free seasonings or herbs instead of table salt or sea salt. Check with your health care provider or pharmacist before using salt  substitutes.  Do not fry foods. Cook foods using healthy methods such as baking, boiling, grilling, and broiling instead.  Cook with heart-healthy oils, such as olive, canola, soybean, or sunflower oil. Meal planning  Eat a balanced diet that includes: ? 5 or more servings of fruits and vegetables each day. At each meal, try to fill half of your plate with fruits and vegetables. ? Up to 6-8 servings of  whole grains each day. ? Less than 6 oz of lean meat, poultry, or fish each day. A 3-oz serving of meat is about the same size as a deck of cards. One egg equals 1 oz. ? 2 servings of low-fat dairy each day. ? A serving of nuts, seeds, or beans 5 times each week. ? Heart-healthy fats. Healthy fats called Omega-3 fatty acids are found in foods such as flaxseeds and coldwater fish, like sardines, salmon, and mackerel.  Limit how much you eat of the following: ? Canned or prepackaged foods. ? Food that is high in trans fat, such as fried foods. ? Food that is high in saturated fat, such as fatty meat. ? Sweets, desserts, sugary drinks, and other foods with added sugar. ? Full-fat dairy products.  Do not salt foods before eating.  Try to eat at least 2 vegetarian meals each week.  Eat more home-cooked food and less restaurant, buffet, and fast food.  When eating at a restaurant, ask that your food be prepared with less salt or no salt, if possible. What foods are recommended? The items listed may not be a complete list. Talk with your dietitian about what dietary choices are best for you. Grains Whole-grain or whole-wheat bread. Whole-grain or whole-wheat pasta. Brown rice. [place]. Bulgur. Whole-grain and low-sodium cereals. Pita bread. Low-fat, low-sodium crackers. Whole-wheat flour tortillas. Vegetables Fresh or frozen vegetables (raw, steamed, roasted, or grilled). Low-sodium or reduced-sodium tomato and vegetable juice. Low-sodium or reduced-sodium tomato sauce and tomato  paste. Low-sodium or reduced-sodium canned vegetables. Fruits All fresh, dried, or frozen fruit. Canned fruit in natural juice (without added sugar). Meat and other protein foods Skinless chicken or [place]. Ground chicken or [place]. Pork with fat trimmed off. Fish and seafood. Egg whites. Dried beans, peas, or lentils. Unsalted nuts, nut butters, and seeds. Unsalted canned beans. Lean cuts of beef with fat trimmed off. Low-sodium, lean deli meat. Dairy Low-fat (1%) or fat-free (skim) milk. Fat-free, low-fat, or reduced-fat cheeses. Nonfat, low-sodium ricotta or cottage cheese. Low-fat or nonfat yogurt. Low-fat, low-sodium cheese. Fats and oils Soft margarine without trans fats. Vegetable oil. Low-fat, reduced-fat, or light mayonnaise and salad dressings (reduced-sodium). Canola, safflower, olive, soybean, and sunflower oils. Avocado. Seasoning and other foods Herbs. Spices. Seasoning mixes without salt. Unsalted popcorn and pretzels. Fat-free sweets. What foods are not recommended? The items listed may not be a complete list. Talk with your dietitian about what dietary choices are best for you. Grains Baked goods made with fat, such as croissants, muffins, or some breads. Dry pasta or rice meal packs. Vegetables Creamed or fried vegetables. Vegetables in a cheese sauce. Regular canned vegetables (not low-sodium or reduced-sodium). Regular canned tomato sauce and paste (not low-sodium or reduced-sodium). Regular tomato and vegetable juice (not low-sodium or reduced-sodium). Angie Fava. Olives. Fruits Canned fruit in a light or heavy syrup. Fried fruit. Fruit in cream or butter sauce. Meat and other protein foods Fatty cuts of meat. Ribs. Fried meat. Berniece Salines. Sausage. Bologna and other processed lunch meats. Salami. Fatback. Hotdogs. Bratwurst. Salted nuts and seeds. Canned beans with added salt. Canned or smoked fish. Whole eggs or egg yolks. Chicken or [place] with skin. Dairy Whole or 2% milk,  cream, and half-and-half. Whole or full-fat cream cheese. Whole-fat or sweetened yogurt. Full-fat cheese. Nondairy creamers. Whipped toppings. Processed cheese and cheese spreads. Fats and oils Butter. Stick margarine. Lard. Shortening. Ghee. Bacon fat. Tropical oils, such as coconut, palm kernel, or palm oil. Seasoning and other foods Salted  popcorn and pretzels. Onion salt, garlic salt, seasoned salt, table salt, and sea salt. Worcestershire sauce. Tartar sauce. Barbecue sauce. Teriyaki sauce. Soy sauce, including reduced-sodium. Steak sauce. Canned and packaged gravies. Fish sauce. Oyster sauce. Cocktail sauce. Horseradish that you find on the shelf. Ketchup. Mustard. Meat flavorings and tenderizers. Bouillon cubes. Hot sauce and Tabasco sauce. Premade or packaged marinades. Premade or packaged taco seasonings. Relishes. Regular salad dressings. Where to find more information:  National Heart, Lung, and [place]: https://wilson-eaton.com/  American Heart Association: www.heart.org Summary  The DASH eating plan is a healthy eating plan that has been shown to reduce high blood pressure (hypertension). It may also reduce your risk for type 2 diabetes, heart disease, and stroke.  With the DASH eating plan, you should limit salt (sodium) intake to 2,300 mg a day. If you have hypertension, you may need to reduce your sodium intake to 1,500 mg a day.  When on the DASH eating plan, aim to eat more fresh fruits and vegetables, whole grains, lean proteins, low-fat dairy, and heart-healthy fats.  Work with your health care provider or diet and nutrition specialist (dietitian) to adjust your eating plan to your individual calorie needs. This information is not intended to replace advice given to you by your health care provider. Make sure you discuss any questions you have with your health care provider. Document Revised: 04/18/2017 Document Reviewed: 04/29/2016 Elsevier Patient Education  2020 [employer].

## 2020-02-23 ENCOUNTER — Telehealth: Payer: Medicare Other

## 2020-02-23 DIAGNOSIS — E785 Hyperlipidemia, unspecified: Secondary | ICD-10-CM | POA: Diagnosis not present

## 2020-02-23 DIAGNOSIS — Z7982 Long term (current) use of aspirin: Secondary | ICD-10-CM | POA: Diagnosis not present

## 2020-02-23 DIAGNOSIS — Z9181 History of falling: Secondary | ICD-10-CM | POA: Diagnosis not present

## 2020-02-23 DIAGNOSIS — D519 Vitamin B12 deficiency anemia, unspecified: Secondary | ICD-10-CM | POA: Diagnosis not present

## 2020-02-23 DIAGNOSIS — Z7984 Long term (current) use of oral hypoglycemic drugs: Secondary | ICD-10-CM | POA: Diagnosis not present

## 2020-02-23 DIAGNOSIS — M4800 Spinal stenosis, site unspecified: Secondary | ICD-10-CM | POA: Diagnosis not present

## 2020-02-23 DIAGNOSIS — E1169 Type 2 diabetes mellitus with other specified complication: Secondary | ICD-10-CM | POA: Diagnosis not present

## 2020-02-23 DIAGNOSIS — K219 Gastro-esophageal reflux disease without esophagitis: Secondary | ICD-10-CM | POA: Diagnosis not present

## 2020-02-23 DIAGNOSIS — G8929 Other chronic pain: Secondary | ICD-10-CM | POA: Diagnosis not present

## 2020-02-23 DIAGNOSIS — G5600 Carpal tunnel syndrome, unspecified upper limb: Secondary | ICD-10-CM | POA: Diagnosis not present

## 2020-02-23 DIAGNOSIS — E1140 Type 2 diabetes mellitus with diabetic neuropathy, unspecified: Secondary | ICD-10-CM | POA: Diagnosis not present

## 2020-02-29 DIAGNOSIS — Z9181 History of falling: Secondary | ICD-10-CM | POA: Diagnosis not present

## 2020-02-29 DIAGNOSIS — D519 Vitamin B12 deficiency anemia, unspecified: Secondary | ICD-10-CM | POA: Diagnosis not present

## 2020-02-29 DIAGNOSIS — G5600 Carpal tunnel syndrome, unspecified upper limb: Secondary | ICD-10-CM | POA: Diagnosis not present

## 2020-02-29 DIAGNOSIS — K219 Gastro-esophageal reflux disease without esophagitis: Secondary | ICD-10-CM | POA: Diagnosis not present

## 2020-02-29 DIAGNOSIS — Z7982 Long term (current) use of aspirin: Secondary | ICD-10-CM | POA: Diagnosis not present

## 2020-02-29 DIAGNOSIS — E785 Hyperlipidemia, unspecified: Secondary | ICD-10-CM | POA: Diagnosis not present

## 2020-02-29 DIAGNOSIS — Z7984 Long term (current) use of oral hypoglycemic drugs: Secondary | ICD-10-CM | POA: Diagnosis not present

## 2020-02-29 DIAGNOSIS — E1140 Type 2 diabetes mellitus with diabetic neuropathy, unspecified: Secondary | ICD-10-CM | POA: Diagnosis not present

## 2020-02-29 DIAGNOSIS — E1169 Type 2 diabetes mellitus with other specified complication: Secondary | ICD-10-CM | POA: Diagnosis not present

## 2020-02-29 DIAGNOSIS — G8929 Other chronic pain: Secondary | ICD-10-CM | POA: Diagnosis not present

## 2020-02-29 DIAGNOSIS — M4800 Spinal stenosis, site unspecified: Secondary | ICD-10-CM | POA: Diagnosis not present

## 2020-03-01 ENCOUNTER — Telehealth: Payer: Self-pay | Admitting: Nurse Practitioner

## 2020-03-01 NOTE — Telephone Encounter (Signed)
Copied from [place] [id number]. Topic: Medicare AWV >> Mar 01, 2020  3:06 PM Cher Nakai R wrote: Reason for CRM: Left message for patient to call back and schedule the Medicare Annual Wellness Visit (AWV) virtually.  Last AWV 11/19/2018  Please schedule at anytime with [facility]-Nurse Health Advisor.  45 minute appointment  Any questions, please call me at [phone]

## 2020-03-21 ENCOUNTER — Ambulatory Visit: Payer: Self-pay | Admitting: General Practice

## 2020-03-21 ENCOUNTER — Telehealth: Payer: Self-pay | Admitting: General Practice

## 2020-03-21 ENCOUNTER — Telehealth: Payer: Medicare Other | Admitting: General Practice

## 2020-03-21 DIAGNOSIS — F200 Paranoid schizophrenia: Secondary | ICD-10-CM

## 2020-03-21 DIAGNOSIS — E1159 Type 2 diabetes mellitus with other circulatory complications: Secondary | ICD-10-CM

## 2020-03-21 DIAGNOSIS — E785 Hyperlipidemia, unspecified: Secondary | ICD-10-CM

## 2020-03-21 DIAGNOSIS — B182 Chronic viral hepatitis C: Secondary | ICD-10-CM

## 2020-03-21 DIAGNOSIS — R296 Repeated falls: Secondary | ICD-10-CM

## 2020-03-21 DIAGNOSIS — E1169 Type 2 diabetes mellitus with other specified complication: Secondary | ICD-10-CM

## 2020-03-21 NOTE — Chronic Care Management (AMB) (Signed)
Chronic Care Management   Follow Up Note   03/21/2020 Name: Ryan [name] MRN: [id number] DOB: Sep 13, 1950  Referred by: Ryan Lick, NP Reason for referral : Chronic Care Management (RNCM Chronic Disease Management and Care Coordination Needs)   Ryan [name] is a 69 y.o. year old adult who is a primary care patient of Cannady, Ryan Faster, NP. The CCM team was consulted for assistance with chronic disease management and care coordination needs.    Review of patient status, including review of consultants reports, relevant laboratory and other test results, and collaboration with appropriate care team members and the patient's provider was performed as part of comprehensive patient evaluation and provision of chronic care management services.    SDOH (Social Determinants of Health) assessments performed: Yes See Care Plan activities for detailed interventions related to [facility])     Outpatient Encounter Medications as of 03/21/2020  Medication Sig  . aspirin 81 MG chewable tablet Chew 81 mg by mouth daily.   [name] atenolol (TENORMIN) 25 MG tablet Take 1 tablet (25 mg total) by mouth daily.  [name] gabapentin (NEURONTIN) 100 MG capsule TAKE 1 CAPSULE BY MOUTH THREE TIMES A DAY  . meloxicam (MOBIC) 15 MG tablet TAKE 1 TABLET BY MOUTH EVERY DAY  . metFORMIN (GLUCOPHAGE) 500 MG tablet Start out taking 1 tablet (500 MG) by mouth once a day for one week and then increase to taking 1 tablet (500 MG) by mouth twice a day.  . Multiple Vitamin (MULTIVITAMIN) tablet Take 1 tablet by mouth daily.  (Patient not taking: Reported on 02/21/2020)  . OLANZapine (ZYPREXA) 20 MG tablet Take 2 tablets (40 mg total) by mouth at bedtime. (Patient taking differently: Take 20 mg by mouth at bedtime. )   No facility-administered encounter medications on file as of 03/21/2020.     Objective:  BP Readings from Last 3 Encounters:  01/10/20 118/78  07/02/19 133/82  06/21/19 129/82    Goals Addressed                This Visit's Progress   .  RNCM: Pt- "I am fine" (pt-stated)        CARE PLAN ENTRY (see longtitudinal plan of care for additional care plan information)  Current Barriers:  . Chronic Disease Management support, education, and care coordination needs related to HTN, HLD, Schizophrenia and hepatitis C  Clinical Goal(s) related to HTN, HLD, Schizophrenia and hepatitis C:  Over the next 120 days, patient will:  . Work with the care management team to address educational, disease management, and care coordination needs  . Begin or continue self health monitoring activities as directed today Measure and record blood pressure 2 times per week and increase activity level and adhere to a heart healthy/ADA diet . Call provider office for new or worsened signs and symptoms HTN, HLD, Schizophrenia and hepatitis C . Call care management team with questions or concerns . Verbalize basic understanding of patient centered plan of care established today  Interventions related to HTN, HLD, Schizophrenia and hepatitis C  . Evaluation of current treatment plans and patient's adherence to plan as established by provider.  The patient is waiting on paperwork to have a colonoscopy. Does not know when it will be scheduled yet.  . Assessed patient understanding of disease states.  03-21-2020: the patient states he is doing well and denies any new issues at this time. Did not want to talk long. States he will come to see [name] on 04-11-2020.  The RNCM will attempt to see the patient when coming into the office to see pcp.  . Assessed patient's education and care coordination needs. Review of SDOH- denies any needs at this time. . Provided disease specific education to patient. Review of safety and adherence of heart healthy/ADA diet.  03-21-2020: The patient states he had a fall about a month ago. The patient denies any acute distress. The patient states he had shoes on and was walking in the grass. The patient was not  evaluated post fall. Denies injuries. Review of safety and fall prevention. The patient does not take his blood pressure. States he has lost weight but does not know how much.  [name] with appropriate clinical care team members regarding patient needs.  The patient currently working with the CCM team pharmacist.   Patient Self Care Activities related to HTN, HLD, Schizophrenia and hepatitis C:  . Patient is unable to independently self-manage chronic health conditions  Please see past updates related to this goal by clicking on the "Past Updates" button in the selected goal      .  RNCM: Pt-"I do not feel like I need to take my blood sugars, I don't have a machine" (pt-stated)        CARE PLAN ENTRY (see longtitudinal plan of care for additional care plan information)  Objective:  Lab Results  Component Value Date   HGBA1C 7.2 (H) 01/10/2020 .   Lab Results  Component Value Date   CREATININE 0.99 01/10/2020   CREATININE 0.91 06/21/2019   CREATININE 1.05 03/18/2018 .   [name] No results found for: EGFR  Current Barriers:  [name] Knowledge Deficits related to basic Diabetes pathophysiology and self care/management . Does not have glucometer to monitor blood sugar- knows he can get one but refuses, states he will not take his blood sugars.  . 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 Does not use cbg meter- states he is going to be around "forever" and does not need to check his blood sugars  Case Manager Clinical Goal(s):  Over the next 120 days, patient will demonstrate improved adherence to prescribed treatment plan for diabetes self care/management as evidenced by:  . daily monitoring and recording of CBG  . adherence to ADA/ carb modified diet . exercise 3/4 days/week . adherence to prescribed medication regimen  Interventions:  . Provided education to patient about basic DM disease process . Reviewed medications with patient and discussed importance of  medication adherence . Discussed plans with patient for ongoing care management follow up and provided patient with direct contact information for care management team . Provided patient with written educational materials related to hypo and hyperglycemia and importance of correct treatment.  The denies any hypo or hyperglycemic events. Review of the sx and sx.  Education on hemoglobin A1C of 7.2 on 01-10-2020 with a goal of hemoglobin A1C being less than 7.0.  The patient verbalized understanding. Confirms adherence to an ADA diet.  . Advised patient, providing education and rationale, to check cbg daily and record, calling pcp for findings outside established parameters.  The patient does not feel the need to check blood sugars. Education on the benefits of checking blood sugars, the patient still refuses.Will continue to work with the patient to educate and support. 03-21-2020: The patient verbalized he is not taking his blood sugars and has no interest in taking his blood sugars. He denies having any issues with hypoglycemia. Education and support.  . Referral made to  pharmacy team for assistance with medications and DM management. The pharmacist has upcoming initial outreach in October.  . Review of patient status, including review of consultants reports, relevant laboratory and other test results, and medications completed.  Patient Self Care Activities:  . UNABLE to independently mange DM as evidence of hemoglobin A1C of 7.2 and declining the need to check blood glucose levels by finger sticks.  . Checks blood sugars as prescribed and utilize hyper and hypoglycemia protocol as needed . Adheres to prescribed ADA/carb modified  Please see past updates related to this goal by clicking on the "Past Updates" button in the selected goal          Plan:   Face to Face appointment with care management team member scheduled for: 04-11-2020 at 11:00 am. Appointment with Henrine Screws at 10:40 am Telephone  follow up appointment with care management team member scheduled for: 05-23-2020 at 1 pm   Noreene Larsson RN, MSN, [facility] Mobile: [phone]

## 2020-03-21 NOTE — Telephone Encounter (Signed)
  Chronic Care Management   Note  03/21/2020 Name: Ryan [name] MRN: [id number] DOB: 1950/09/05    The patient called the RNCM back and the call was completed. See new encounter for information.   Noreene Larsson RN, MSN, [facility] Mobile: [phone]

## 2020-03-21 NOTE — Patient Instructions (Signed)
Visit Information  Goals Addressed              This Visit's Progress   .  RNCM: Pt- "I am fine" (pt-stated)        CARE PLAN ENTRY (see longtitudinal plan of care for additional care plan information)  Current Barriers:  . Chronic Disease Management support, education, and care coordination needs related to HTN, HLD, Schizophrenia and hepatitis C  Clinical Goal(s) related to HTN, HLD, Schizophrenia and hepatitis C:  Over the next 120 days, patient will:  . Work with the care management team to address educational, disease management, and care coordination needs  . Begin or continue self health monitoring activities as directed today Measure and record blood pressure 2 times per week and increase activity level and adhere to a heart healthy/ADA diet . Call provider office for new or worsened signs and symptoms HTN, HLD, Schizophrenia and hepatitis C . Call care management team with questions or concerns . Verbalize basic understanding of patient centered plan of care established today  Interventions related to HTN, HLD, Schizophrenia and hepatitis C  . Evaluation of current treatment plans and patient's adherence to plan as established by provider.  The patient is waiting on paperwork to have a colonoscopy. Does not know when it will be scheduled yet.  . Assessed patient understanding of disease states.  03-21-2020: the patient states he is doing well and denies any new issues at this time. Did not want to talk long. States he will come to see [name] on 04-11-2020.  The RNCM will attempt to see the patient when coming into the office to see pcp.  . Assessed patient's education and care coordination needs. Review of SDOH- denies any needs at this time. . Provided disease specific education to patient. Review of safety and adherence of heart healthy/ADA diet.  03-21-2020: The patient states he had a fall about a month ago. The patient denies any acute distress. The patient states he had shoes  on and was walking in the grass. The patient was not evaluated post fall. Denies injuries. Review of safety and fall prevention. The patient does not take his blood pressure. States he has lost weight but does not know how much.  . Collaborated with appropriate clinical care team members regarding patient needs.  The patient currently working with the CCM team pharmacist.   Patient Self Care Activities related to HTN, HLD, Schizophrenia and hepatitis C:  . Patient is unable to independently self-manage chronic health conditions  Please see past updates related to this goal by clicking on the "Past Updates" button in the selected goal      .  RNCM: Pt-"I do not feel like I need to take my blood sugars, I don't have a machine" (pt-stated)        CARE PLAN ENTRY (see longtitudinal plan of care for additional care plan information)  Objective:  Lab Results  Component Value Date   HGBA1C 7.2 (H) 01/10/2020 .   Lab Results  Component Value Date   CREATININE 0.99 01/10/2020   CREATININE 0.91 06/21/2019   CREATININE 1.05 03/18/2018 .   . No results found for: EGFR  Current Barriers:  . Knowledge Deficits related to basic Diabetes pathophysiology and self care/management . Does not have glucometer to monitor blood sugar- knows he can get one but refuses, states he will not take his blood sugars.  . 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 Does not use cbg meter- states he   is going to be around "forever" and does not need to check his blood sugars  Case Manager Clinical Goal(s):  Over the next 120 days, patient will demonstrate improved adherence to prescribed treatment plan for diabetes self care/management as evidenced by:  . daily monitoring and recording of CBG  . adherence to ADA/ carb modified diet . exercise 3/4 days/week . adherence to prescribed medication regimen  Interventions:  . Provided education to patient about basic DM disease process . Reviewed  medications with patient and discussed importance of medication adherence . Discussed plans with patient for ongoing care management follow up and provided patient with direct contact information for care management team . Provided patient with written educational materials related to hypo and hyperglycemia and importance of correct treatment.  The denies any hypo or hyperglycemic events. Review of the sx and sx.  Education on hemoglobin A1C of 7.2 on 01-10-2020 with a goal of hemoglobin A1C being less than 7.0.  The patient verbalized understanding. Confirms adherence to an ADA diet.  . Advised patient, providing education and rationale, to check cbg daily and record, calling pcp for findings outside established parameters.  The patient does not feel the need to check blood sugars. Education on the benefits of checking blood sugars, the patient still refuses.Will continue to work with the patient to educate and support. 03-21-2020: The patient verbalized he is not taking his blood sugars and has no interest in taking his blood sugars. He denies having any issues with hypoglycemia. Education and support.  . Referral made to pharmacy team for assistance with medications and DM management. The pharmacist has upcoming initial outreach in October.  . Review of patient status, including review of consultants reports, relevant laboratory and other test results, and medications completed.  Patient Self Care Activities:  . UNABLE to independently mange DM as evidence of hemoglobin A1C of 7.2 and declining the need to check blood glucose levels by finger sticks.  . Checks blood sugars as prescribed and utilize hyper and hypoglycemia protocol as needed . Adheres to prescribed ADA/carb modified  Please see past updates related to this goal by clicking on the "Past Updates" button in the selected goal         Patient verbalizes understanding of instructions provided today.   Face to Face appointment with care  management team member scheduled for: 04-11-2020 at 11:00 am. Appointment with Henrine Screws at 10:40 am Telephone follow up appointment with care management team member scheduled for: 05-23-2020 at 1 pm   Noreene Larsson RN, MSN, [facility] Mobile: [phone]

## 2020-04-03 ENCOUNTER — Telehealth: Payer: Self-pay | Admitting: Pharmacist

## 2020-04-03 NOTE — Chronic Care Management (AMB) (Signed)
Chronic Care Management Pharmacy Assistant   Name: Rudransh Bellanca  MRN: [id number] DOB: 1951-01-16  Reason for Encounter: General Adherence Call  Patient Questions:  1.  Have you seen any other providers since your last visit? No  2.  Any changes in your medicines or health? No     PCP : [name] T, NP  Allergies:  No Known Allergies  Medications: Outpatient Encounter Medications as of 04/03/2020  Medication Sig  . aspirin 81 MG chewable tablet Chew 81 mg by mouth daily.   [name] atenolol (TENORMIN) 25 MG tablet Take 1 tablet (25 mg total) by mouth daily.  [name] gabapentin (NEURONTIN) 100 MG capsule TAKE 1 CAPSULE BY MOUTH THREE TIMES A DAY  . meloxicam (MOBIC) 15 MG tablet TAKE 1 TABLET BY MOUTH EVERY DAY  . metFORMIN (GLUCOPHAGE) 500 MG tablet Start out taking 1 tablet (500 MG) by mouth once a day for one week and then increase to taking 1 tablet (500 MG) by mouth twice a day.  . Multiple Vitamin (MULTIVITAMIN) tablet Take 1 tablet by mouth daily.  (Patient not taking: Reported on 02/21/2020)  . OLANZapine (ZYPREXA) 20 MG tablet Take 2 tablets (40 mg total) by mouth at bedtime. (Patient taking differently: Take 20 mg by mouth at bedtime. )   No facility-administered encounter medications on file as of 04/03/2020.    Current Diagnosis: Patient Active Problem List   Diagnosis Date Noted  . Type 2 diabetes mellitus with morbid obesity ([place]) 01/10/2020  . Morbid obesity ([place]) 01/10/2020  . Hyperlipidemia associated with type 2 diabetes mellitus ([place]) 04/20/2018  . Spinal stenosis 11/24/2017  . Frequent falls 03/18/2017  . GERD (gastroesophageal reflux disease) 09/06/2016  . Advanced care planning/counseling discussion 09/06/2016  . 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 Hepatitis C 06/26/2015  . Chronic pain 01/25/2015  . Hypertension associated with diabetes ([place]) 01/25/2015  . Peripheral  neuropathy ([place]) 01/25/2015  . Carpal tunnel syndrome on left 01/25/2015  . 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 Schizophrenia ([place]) 04/25/2014  . History of traumatic brain injury 04/25/2014  . Hallucination, visual 04/24/2014  . Arthropathy of cervical facet joint 07/20/2013  . Back pain 02/10/2012   Have you seen any other providers since your last visit? NO   Any changes in your medications or health? NO  Any side effects from any medications? Patient states Rx Zyprexa makes him sleepy.  Do you have an symptoms or problems not managed by your medications? Patient states he has no problems with his medications he is fine.  Any concerns about your health right now? Patient states he has no concerns.  Has your provider asked that you check blood pressure, blood sugar, or follow special diet at home? Patient didn't give a direct answer; he does not have a problem with his blood pressure because he is doing fine. Per note on 02/21/20 Birdena Crandall CPP- patient refuses to check BP and blood sugars at home. Patient encouraged to add vegetables to his chicken nugget meals and eat fruit instead of ice cream.   Do you get any type of exercise on a regular basis? Patient states he walks around, and feels as though when he gets ready to exercise he will.  Can you think of a goal you would like to reach for your health? Patient states he is doing fine   Do you have any problems getting your medications? Patient  states he doesn't need nothing. He is compliant when taking his medications.  Is there anything that you would like to discuss during the appointment? Patient states he will discuss with Dr. Ned Card the day of appointment.  Please bring medications and supplements to appointment  Goals Addressed   None    04/03/20- CPA called and spoke with the patient in regards to a general assessment. The patient states he has not seen any other providers since his last visit with CPP Birdena Crandall. The patient  was frustrated through the phone call and stated several times he is doing fine after each question asked. CPA will notify CPP, Birdena Crandall.  Raynelle Highland, [place] Assistant [phone]   Follow-Up:  Pharmacist Review.

## 2020-04-11 ENCOUNTER — Ambulatory Visit: Payer: Self-pay

## 2020-04-11 ENCOUNTER — Ambulatory Visit: Payer: Medicare Other | Admitting: Nurse Practitioner

## 2020-04-26 ENCOUNTER — Telehealth: Payer: Self-pay | Admitting: Nurse Practitioner

## 2020-04-26 NOTE — Telephone Encounter (Signed)
Copied from [place] [id number]. Topic: Medicare AWV >> Apr 26, 2020 10:12 AM Cher Nakai R wrote: Reason for CRM:   Left message for patient to call back and schedule the Medicare Annual Wellness Visit (AWV) virtually.  Last AWV 11/19/2018  Please schedule at anytime with CFP-Nurse Health Advisor.  45 minute appointment  Any questions, please call me at [phone]

## 2020-04-26 NOTE — Telephone Encounter (Signed)
Called pt no answer left vm awv would be done via phone call not in office.

## 2020-04-26 NOTE — Telephone Encounter (Signed)
Pt stated he doesn't need this appt since ha has had his CPE/ advised pt of medicare appt and he stated his blood work was fine and he didn't need the appt in office during pandemic  / please advise

## 2020-04-27 NOTE — Telephone Encounter (Signed)
Called pt scheduled for 12/20

## 2020-04-28 ENCOUNTER — Ambulatory Visit: Payer: Medicare Other | Admitting: Nurse Practitioner

## 2020-05-03 ENCOUNTER — Telehealth: Payer: Self-pay

## 2020-05-03 ENCOUNTER — Other Ambulatory Visit: Payer: Self-pay | Admitting: Nurse Practitioner

## 2020-05-03 NOTE — Telephone Encounter (Signed)
Routing to manager.

## 2020-05-03 NOTE — Telephone Encounter (Signed)
Copied from [place] [id number]. Topic: General - Other >> May 03, 2020  8:33 AM Oneta Rack wrote: Reason for CRM: patient received a text message at 2:30am regarding her medication, patient upset and states she could not go back to sleep and wanted to make PCP and Practice Admin aware.

## 2020-05-08 ENCOUNTER — Ambulatory Visit: Payer: Medicare Other

## 2020-05-17 ENCOUNTER — Ambulatory Visit: Payer: Self-pay | Admitting: Pharmacist

## 2020-05-17 NOTE — Chronic Care Management (AMB) (Signed)
Chronic Care Management Pharmacy  Name: Ryan [name]  MRN: [id number] DOB: 1951/03/06   Chief Complaint/ HPI  Ryan [name],  69 y.o. , adult presents for their Initial CCM visit with the clinical pharmacist via telephone.  PCP : Ryan Lick, NP Patient Care Team: Ryan Lick, NP as PCP - General (Nurse Practitioner) Ryan [name], [facility] (Pharmacist) Ryan Ingles, RN as Case Manager (General Practice)  Their chronic conditions include: Hypertension, Hyperlipidemia, Diabetes and Schizophrenia and chronic pain   Office Visits: 01/10/20- Ryan Guarneri, NP- blood work, metformin   No Known Allergies  Medications: Outpatient Encounter Medications as of 05/17/2020  Medication Sig  . aspirin 81 MG chewable tablet Chew 81 mg by mouth daily.   [name] atenolol (TENORMIN) 25 MG tablet Take 1 tablet (25 mg total) by mouth daily.  [name] gabapentin (NEURONTIN) 100 MG capsule TAKE 1 CAPSULE BY MOUTH THREE TIMES A DAY  . meloxicam (MOBIC) 15 MG tablet TAKE 1 TABLET BY MOUTH EVERY DAY  . metFORMIN (GLUCOPHAGE) 500 MG tablet Start out taking 1 tablet (500 MG) by mouth once a day for one week and then increase to taking 1 tablet (500 MG) by mouth twice a day.  . Multiple Vitamin (MULTIVITAMIN) tablet Take 1 tablet by mouth daily.  (Patient not taking: Reported on 02/21/2020)  . OLANZapine (ZYPREXA) 20 MG tablet Take 2 tablets (40 mg total) by mouth at bedtime. (Patient taking differently: Take 20 mg by mouth at bedtime. )   No facility-administered encounter medications on file as of 05/17/2020.    Wt Readings from Last 3 Encounters:  01/10/20 229 lb (103.9 kg)  04/20/18 216 lb (98 kg)  03/18/18 217 lb (98.4 kg)    Current Diagnosis/Assessment:    Goals Addressed   None     Diabetes/ peripheral neuropathy   A1c goal <7%  Recent Relevant Labs: Lab Results  Component Value Date/Time   HGBA1C 7.2 (H) 01/10/2020 01:29 PM   HGBA1C 6.3 06/21/2019 01:21 PM    Last  diabetic Eye exam: No results found for: HMDIABEYEEXA States he will make an appointment with eye doctor.  Last diabetic Foot exam: No results found for: HMDIABFOOTEX Endorses checking his feet "sometimes"  Checking BG: Never  Patient has failed these meds in past: NA Patient is currently uncontrolled on the following medications: [name] Metformin 500 mg bid . Gabapentin 100 mg tid  We discussed:  Discussed the importance of SMBG. Patient refuses stating "I take pills"  Denies any symptoms of hypoglycemia. Reports he is exercising regularly at home. Recently had in home visit from Ryan [name]. Does not remember sugar reading and can't find the print out. Does remember that she has not lost any weight. Encouraged patient to eat regular, smaller meals. Reviewed the importance of checking her feet daily to look for cuts, scrapes or blisters that could become infected given his neuropathy.  Metformin last fillef 04/07/20  90 day, gabapentin last filled 12/20/19 qty#[id number]. Patient states she has all her medications and declines going over in detail because she is doing the laundry. Discussed cancelled appointment with Ryan [name] last week. Patient states she already a physical when the nurse from the insurance came to her house. Encouraged her reschedule with Ryan [name] for updated labs.  Plan  Continue current medications  Hypertension   BP goal is:  <130/80  Office blood pressures are  BP Readings from Last 3 Encounters:  01/10/20 118/78  07/02/19 133/82  06/21/19 129/82  Patient checks BP at home Never  Patient has failed these meds in the past: NA Patient is currently controlled on the following medications:  . Atenolol 25 mg daily  We discussed: Counseled that elevated blood pressure may not always be associated with feeling poorly and encouraged self monitoring at home to ensure effectiveness of therapy. Patient states the nurse checked it but does not remember reading. Last fill atenolol 02/28/20 90  day. Denies missed doses.  Plan  Continue current medications       Schizophrenia   Patient has failed these meds in past:  Patient is currently controlled on the following medications:  . Olanzapine 20 mg qhs  We discussed:  All refills up to date. Last fill 04/26/20.  Plan  Continue current medications  Vaccines   Reviewed and discussed patient's vaccination history.    Immunization History  Administered Date(s) Administered  . Fluad Quad(high Dose 65+) 01/26/2019  . Influenza-Unspecified 06/09/2012, 01/27/2013, 02/09/2014, 02/06/2015, 02/22/2016, 02/05/2017, 02/01/2018  . Moderna Sars-Covid-2 Vaccination 07/21/2019, 08/17/2019  . Td 05/21/2003  . Tdap 04/23/2014, 07/25/2015    Plan  Patient states she has received 2 doses of the pneumonia vaccine.   Medication Management   [facility] Uses pill box? No - Keeps his medications beside his glasses on table. Pt endorses 100% compliance. Per fill history in computer all refills up to date.   Plan  Continue current medication management strategy  Patient not interested in [facility] enhanced pharmacy services at this time. Enjoys getting out of the house and [facility] to [facility]. She pays a friend $10 for transportation.    Follow up: 3  month phone visit  Mercer Pod. Tiburcio Pea PharmD, BCPS Clinical Pharmacist [facility] [phone]

## 2020-05-17 NOTE — Patient Instructions (Addendum)
Visit Information:  Aniv,   It was a pleasure speaking with you today. Thank you for letting me be part of your clinical team. Please call with any questions or concerns.   Junita Push. Kenton Kingfisher PharmD, BCPS Clinical Pharmacist [facility] [phone]   Goals Addressed            This Visit's Progress   . Pharmacy Care Plan       CARE PLAN ENTRY (see longitudinal plan of care for additional care plan information)  Current Barriers:  . Chronic Disease Management support, education, and care coordination needs related to Hypertension, Hyperlipidemia, Diabetes, GERD, and Hepatitis, peripheral neuropathy   Hypertension BP Readings from Last 3 Encounters:  01/10/20 118/78  07/02/19 133/82  06/21/19 129/82   . Pharmacist Clinical Goal(s): o Over the next 60 days, patient will work with PharmD and providers to maintain BP goal <130/80 . Current regimen:  o Atenolol 25 mg qd . Interventions: o Reviewed fill dates in computer o Provided diet and exercise counseling. . Patient self care activities - Over the next 60 days, patient will: o Check BP at office visist, document, and provide at future appointments o Ensure daily salt intake < 2300 mg/day o Reschedule with PCP   Diabetes Lab Results  Component Value Date/Time   HGBA1C 7.2 (H) 01/10/2020 01:29 PM   HGBA1C 6.3 06/21/2019 01:21 PM   . Pharmacist Clinical Goal(s): o Over the next 60 days, patient will work with PharmD and providers to achieve A1c goal <7% . Current regimen:  o Metformin 500 mg bid . Interventions: o Reviewed fill history in computer o Reviewed missed appointment with [name] and overdue lab values o Encouraged patient to consider testing at home . Patient self care activities - Over the next 60 days, patient will: o Check blood sugar at office visits o Reschedule PCP appointment o Contact provider with any episodes of hypoglycemia   Medication management . Pharmacist Clinical  Goal(s): o Over the next 60 days, patient will work with PharmD and providers to achieve optimal medication adherence . [facility] . Interventions o Comprehensive medication review performed. o Continue current medication management strategy . Patient self care activities - Over the next 60 days, patient will: o Focus on medication adherence by fill dates o Take medications as prescribed o Report any questions or concerns to PharmD and/or provider(s)  Please see past updates related to this goal by clicking on the "Past Updates" button in the selected goal          The patient verbalized understanding of instructions, educational materials, and care plan provided today and agreed to receive a mailed copy of patient instructions, educational materials, and care plan.   Telephone follow up appointment with pharmacy team member scheduled for: 2 months  Junita Push. Kenton Kingfisher PharmD, BCPS Clinical Pharmacist [phone]  Hypertension, Adult Hypertension is another name for high blood pressure. High blood pressure forces your heart to work harder to pump blood. This can cause problems over time. There are two numbers in a blood pressure reading. There is a top number (systolic) over a bottom number (diastolic). It is best to have a blood pressure that is below 120/80. Healthy choices can help lower your blood pressure, or you may need medicine to help lower it. What are the causes? The cause of this condition is not known. Some conditions may be related to high blood pressure. What increases the risk?  Smoking.  Having type 2 diabetes mellitus,  high cholesterol, or both.  Not getting enough exercise or physical activity.  Being overweight.  Having too much fat, sugar, calories, or salt (sodium) in your diet.  Drinking too much alcohol.  Having long-term (chronic) kidney disease.  Having a family history of high blood pressure.  Age. Risk increases with age.  Race.  You may be at higher risk if you are African American.  Gender. Men are at higher risk than women before age 49. After age 80, women are at higher risk than men.  Having obstructive sleep apnea.  Stress. What are the signs or symptoms?  High blood pressure may not cause symptoms. Very high blood pressure (hypertensive crisis) may cause: ? Headache. ? Feelings of worry or nervousness (anxiety). ? Shortness of breath. ? Nosebleed. ? A feeling of being sick to your stomach (nausea). ? Throwing up (vomiting). ? Changes in how you see. ? Very bad chest pain. ? Seizures. How is this treated?  This condition is treated by making healthy lifestyle changes, such as: ? Eating healthy foods. ? Exercising more. ? Drinking less alcohol.  Your health care provider may prescribe medicine if lifestyle changes are not enough to get your blood pressure under control, and if: ? Your top number is above 130. ? Your bottom number is above 80.  Your personal target blood pressure may vary. Follow these instructions at home: Eating and drinking   If told, follow the DASH eating plan. To follow this plan: ? Fill one half of your plate at each meal with fruits and vegetables. ? Fill one fourth of your plate at each meal with whole grains. Whole grains include whole-wheat pasta, brown rice, and whole-grain bread. ? Eat or drink low-fat dairy products, such as skim milk or low-fat yogurt. ? Fill one fourth of your plate at each meal with low-fat (lean) proteins. Low-fat proteins include fish, chicken without skin, eggs, beans, and tofu. ? Avoid fatty meat, cured and processed meat, or chicken with skin. ? Avoid pre-made or processed food.  Eat less than 1,500 mg of salt each day.  Do not drink alcohol if: ? Your doctor tells you not to drink. ? You are pregnant, may be pregnant, or are planning to become pregnant.  If you drink alcohol: ? Limit how much you use to:  0-1 drink a day for  women.  0-2 drinks a day for men. ? Be aware of how much alcohol is in your drink. In the [place] one drink equals one 12 oz bottle of beer (355 mL), one 5 oz glass of wine (148 mL), or one 1 oz glass of hard liquor (44 mL). Lifestyle   Work with your doctor to stay at a healthy weight or to lose weight. Ask your doctor what the best weight is for you.  Get at least 30 minutes of exercise most days of the week. This may include walking, swimming, or biking.  Get at least 30 minutes of exercise that strengthens your muscles (resistance exercise) at least 3 days a week. This may include lifting weights or doing Pilates.  Do not use any products that contain nicotine or tobacco, such as cigarettes, e-cigarettes, and chewing tobacco. If you need help quitting, ask your doctor.  Check your blood pressure at home as told by your doctor.  Keep all follow-up visits as told by your doctor. This is important. Medicines  Take over-the-counter and prescription medicines only as told by your doctor. Follow directions carefully.  Do not  skip doses of blood pressure medicine. The medicine does not work as well if you skip doses. Skipping doses also puts you at risk for problems.  Ask your doctor about side effects or reactions to medicines that you should watch for. Contact a doctor if you:  Think you are having a reaction to the medicine you are taking.  Have headaches that keep coming back (recurring).  Feel dizzy.  Have swelling in your ankles.  Have trouble with your vision. Get help right away if you:  Get a very bad headache.  Start to feel mixed up (confused).  Feel weak or numb.  Feel faint.  Have very bad pain in your: ? Chest. ? Belly (abdomen).  Throw up more than once.  Have trouble breathing. Summary  Hypertension is another name for high blood pressure.  High blood pressure forces your heart to work harder to pump blood.  For most people, a normal blood  pressure is less than 120/80.  Making healthy choices can help lower blood pressure. If your blood pressure does not get lower with healthy choices, you may need to take medicine. This information is not intended to replace advice given to you by your health care provider. Make sure you discuss any questions you have with your health care provider. Document Revised: 01/14/2018 Document Reviewed: 01/14/2018 Elsevier Patient Education  2020 [employer].

## 2020-05-23 ENCOUNTER — Telehealth: Payer: Self-pay

## 2020-05-24 ENCOUNTER — Ambulatory Visit: Payer: Self-pay

## 2020-08-27 ENCOUNTER — Other Ambulatory Visit: Payer: Self-pay | Admitting: Nurse Practitioner

## 2020-08-27 NOTE — Telephone Encounter (Signed)
Requested Prescriptions  Pending Prescriptions Disp Refills  . atenolol (TENORMIN) 25 MG tablet [Pharmacy Med Name: ATENOLOL 25 MG TABLET] 30 tablet 0    Sig: TAKE 1 TABLET BY MOUTH EVERY DAY     Cardiovascular:  Beta Blockers Failed - 08/27/2020  1:11 AM      Failed - Valid encounter within last 6 months    Recent Outpatient Visits          7 months ago Type 2 diabetes mellitus with morbid obesity ([place])   [place], Henrine Screws T, NP   1 year ago Paranoid schizophrenia ([place])   [place], [place] T, NP   1 year ago Pure hypercholesterolemia   [place], [place] T, NP   1 year ago Patient left without being seen   [facility], Megan P, DO   2 years ago Chronic hepatitis C without hepatic coma ([place])   [place], [name] T, NP             Passed - Last BP in normal range    BP Readings from Last 1 Encounters:  01/10/20 118/78         Passed - Last Heart Rate in normal range    Pulse Readings from Last 1 Encounters:  01/10/20 76

## 2020-09-22 ENCOUNTER — Other Ambulatory Visit: Payer: Self-pay | Admitting: Nurse Practitioner

## 2020-09-22 NOTE — Telephone Encounter (Signed)
Pt declined appt offer and is requesting a call back from the office. Pt does not understand why they need to be seen even after it being explained to them. Please advise  Best contact: [phone]

## 2020-09-22 NOTE — Telephone Encounter (Signed)
Pt has clinical questions as to why he needs apt.

## 2020-09-22 NOTE — Telephone Encounter (Signed)
Requested medication (s) are due for refill today:  yes  Requested medication (s) are on the active medication list: yes  Last refill:  08/27/2020  Future visit scheduled: no  Notes to clinic:  courtesy refill already given  Patient canceled last 2 appts   Requested Prescriptions  Pending Prescriptions Disp Refills   atenolol (TENORMIN) 25 MG tablet [Pharmacy Med Name: ATENOLOL 25 MG TABLET] 30 tablet 0    Sig: TAKE 1 TABLET BY MOUTH EVERY DAY      Cardiovascular:  Beta Blockers Failed - 09/22/2020  9:33 AM      Failed - Valid encounter within last 6 months    Recent Outpatient Visits           8 months ago Type 2 diabetes mellitus with morbid obesity ([place])   [place], Henrine Screws T, NP   1 year ago Paranoid schizophrenia ([place])   [place], [place] T, NP   1 year ago Pure hypercholesterolemia   [place], [place] T, NP   1 year ago Patient left without being seen   [facility], Megan P, DO   2 years ago Chronic hepatitis C without hepatic coma ([place])   [place], [name] T, NP                Passed - Last BP in normal range    BP Readings from Last 1 Encounters:  01/10/20 118/78          Passed - Last Heart Rate in normal range    Pulse Readings from Last 1 Encounters:  01/10/20 76

## 2020-09-22 NOTE — Telephone Encounter (Signed)
Lvm to make this apt. 

## 2020-09-22 NOTE — Telephone Encounter (Signed)
Called and discussed with the patient. Advised that she is overdue for her follow up. Scheduled patient an appointment for 10/13/20

## 2020-10-04 ENCOUNTER — Telehealth: Payer: Self-pay | Admitting: Pharmacist

## 2020-10-04 NOTE — Chronic Care Management (AMB) (Signed)
    Chronic Care Management Pharmacy Assistant   Name: Stetson Pelaez  MRN: [id number] DOB: [birth year]   Reason for Encounter: Disease State-General Adherence    Recent office visits:  None noted  Recent consult visits:  None noted  Hospital visits:  None in previous 6 months  Medications: Outpatient Encounter Medications as of 10/04/2020  Medication Sig  . aspirin 81 MG chewable tablet Chew 81 mg by mouth daily.   [name] atenolol (TENORMIN) 25 MG tablet TAKE 1 TABLET BY MOUTH EVERY DAY  . gabapentin (NEURONTIN) 100 MG capsule TAKE 1 CAPSULE BY MOUTH THREE TIMES A DAY  . meloxicam (MOBIC) 15 MG tablet TAKE 1 TABLET BY MOUTH EVERY DAY  . metFORMIN (GLUCOPHAGE) 500 MG tablet Start out taking 1 tablet (500 MG) by mouth once a day for one week and then increase to taking 1 tablet (500 MG) by mouth twice a day.  . Multiple Vitamin (MULTIVITAMIN) tablet Take 1 tablet by mouth daily.  (Patient not taking: Reported on 02/21/2020)  . OLANZapine (ZYPREXA) 20 MG tablet Take 2 tablets (40 mg total) by mouth at bedtime. (Patient taking differently: Take 20 mg by mouth at bedtime. )   No facility-administered encounter medications on file as of 10/04/2020.   Have you had any problems recently with your health? Patient states he does not have any concerns with his health.  Have you had any problems with your pharmacy? Patient states he is not having any problems with his pharmacy.  What issues or side effects are you having with your medications? Patient states he is not having any side effects that he knows of.  What would you like me to pass along to [facility] for them to help you with?  Patient states there is nothing at this time.  What can we do to take care of you better? Patient states there is nothing at this time.  Star Rating Drugs: Metformin 500 mg last filled 07/15/20 90 DS  [facility] Clinical Pharmacist Assistant [phone]

## 2020-10-07 ENCOUNTER — Encounter: Payer: Self-pay | Admitting: Nurse Practitioner

## 2020-10-07 DIAGNOSIS — I77810 Thoracic aortic ectasia: Secondary | ICD-10-CM | POA: Insufficient documentation

## 2020-10-07 DIAGNOSIS — I700 Atherosclerosis of aorta: Secondary | ICD-10-CM | POA: Insufficient documentation

## 2020-10-13 ENCOUNTER — Other Ambulatory Visit: Payer: Self-pay

## 2020-10-13 ENCOUNTER — Ambulatory Visit (INDEPENDENT_AMBULATORY_CARE_PROVIDER_SITE_OTHER): Payer: Medicare Other | Admitting: Nurse Practitioner

## 2020-10-13 ENCOUNTER — Encounter: Payer: Self-pay | Admitting: Nurse Practitioner

## 2020-10-13 DIAGNOSIS — I77810 Thoracic aortic ectasia: Secondary | ICD-10-CM | POA: Diagnosis not present

## 2020-10-13 DIAGNOSIS — I152 Hypertension secondary to endocrine disorders: Secondary | ICD-10-CM

## 2020-10-13 DIAGNOSIS — E1159 Type 2 diabetes mellitus with other circulatory complications: Secondary | ICD-10-CM

## 2020-10-13 DIAGNOSIS — G63 Polyneuropathy in diseases classified elsewhere: Secondary | ICD-10-CM

## 2020-10-13 DIAGNOSIS — I700 Atherosclerosis of aorta: Secondary | ICD-10-CM | POA: Diagnosis not present

## 2020-10-13 DIAGNOSIS — E785 Hyperlipidemia, unspecified: Secondary | ICD-10-CM

## 2020-10-13 DIAGNOSIS — B182 Chronic viral hepatitis C: Secondary | ICD-10-CM

## 2020-10-13 DIAGNOSIS — Z125 Encounter for screening for malignant neoplasm of prostate: Secondary | ICD-10-CM

## 2020-10-13 DIAGNOSIS — F200 Paranoid schizophrenia: Secondary | ICD-10-CM

## 2020-10-13 DIAGNOSIS — E1169 Type 2 diabetes mellitus with other specified complication: Secondary | ICD-10-CM

## 2020-10-13 DIAGNOSIS — Z23 Encounter for immunization: Secondary | ICD-10-CM | POA: Diagnosis not present

## 2020-10-13 LAB — MICROALBUMIN, URINE WAIVED
Creatinine, Urine Waived: 300 mg/dL (ref 10–300)
Microalb, Ur Waived: 30 mg/L — ABNORMAL HIGH (ref 0–19)
Microalb/Creat Ratio: <30 mg/g (ref <30)

## 2020-10-13 LAB — BAYER DCA HB A1C WAIVED: HB A1C (BAYER DCA - WAIVED): 6.2 % (ref <7.0)

## 2020-10-13 MED ORDER — ROSUVASTATIN CALCIUM 5 MG PO TABS: 5.0000 mg | ORAL_TABLET | Freq: Every day | ORAL | 4 refills | Status: AC

## 2020-10-13 NOTE — Assessment & Plan Note (Signed)
Chronic, ongoing.  Continue Gabapentin 100 MG TID.  Refer to diabetes plan.  Return in 3 months.

## 2020-10-13 NOTE — Patient Instructions (Signed)
Diabetes Mellitus and Nutrition, Adult When you have diabetes, or diabetes mellitus, it is very important to have healthy eating habits because your blood sugar (glucose) levels are greatly affected by what you eat and drink. Eating healthy foods in the right amounts, at about the same times every day, can help you:  Control your blood glucose.  Lower your risk of heart disease.  Improve your blood pressure.  Reach or maintain a healthy weight. What can affect my meal plan? Every person with diabetes is different, and each person has different needs for a meal plan. Your health care provider may recommend that you work with a dietitian to make a meal plan that is best for you. Your meal plan may vary depending on factors such as:  The calories you need.  The medicines you take.  Your weight.  Your blood glucose, blood pressure, and cholesterol levels.  Your activity level.  Other health conditions you have, such as heart or kidney disease. How do carbohydrates affect me? Carbohydrates, also called carbs, affect your blood glucose level more than any other type of food. Eating carbs naturally raises the amount of glucose in your blood. Carb counting is a method for keeping track of how many carbs you eat. Counting carbs is important to keep your blood glucose at a healthy level, especially if you use insulin or take certain oral diabetes medicines. It is important to know how many carbs you can safely have in each meal. This is different for every person. Your dietitian can help you calculate how many carbs you should have at each meal and for each snack. How does alcohol affect me? Alcohol can cause a sudden decrease in blood glucose (hypoglycemia), especially if you use insulin or take certain oral diabetes medicines. Hypoglycemia can be a life-threatening condition. Symptoms of hypoglycemia, such as sleepiness, dizziness, and confusion, are similar to symptoms of having too much  alcohol.  Do not drink alcohol if: ? Your health care provider tells you not to drink. ? You are pregnant, may be pregnant, or are planning to become pregnant.  If you drink alcohol: ? Do not drink on an empty stomach. ? Limit how much you use to:  0-1 drink a day for women.  0-2 drinks a day for men. ? Be aware of how much alcohol is in your drink. In the [place] one drink equals one 12 oz bottle of beer (355 mL), one 5 oz glass of wine (148 mL), or one 1 oz glass of hard liquor (44 mL). ? Keep yourself hydrated with water, diet soda, or unsweetened iced tea.  Keep in mind that regular soda, juice, and other mixers may contain a lot of sugar and must be counted as carbs. What are tips for following this plan? Reading food labels  Start by checking the serving size on the "Nutrition Facts" label of packaged foods and drinks. The amount of calories, carbs, fats, and other nutrients listed on the label is based on one serving of the item. Many items contain more than one serving per package.  Check the total grams (g) of carbs in one serving. You can calculate the number of servings of carbs in one serving by dividing the total carbs by 15. For example, if a food has 30 g of total carbs per serving, it would be equal to 2 servings of carbs.  Check the number of grams (g) of saturated fats and trans fats in one serving. Choose foods that have   a low amount or none of these fats.  Check the number of milligrams (mg) of salt (sodium) in one serving. Most people should limit total sodium intake to less than 2,300 mg per day.  Always check the nutrition information of foods labeled as "low-fat" or "nonfat." These foods may be higher in added sugar or refined carbs and should be avoided.  Talk to your dietitian to identify your daily goals for nutrients listed on the label. Shopping  Avoid buying canned, pre-made, or processed foods. These foods tend to be high in fat, sodium, and added  sugar.  Shop around the outside edge of the grocery store. This is where you will most often find fresh fruits and vegetables, bulk grains, fresh meats, and fresh dairy. Cooking  Use low-heat cooking methods, such as baking, instead of high-heat cooking methods like deep frying.  Cook using healthy oils, such as olive, canola, or sunflower oil.  Avoid cooking with butter, cream, or high-fat meats. Meal planning  Eat meals and snacks regularly, preferably at the same times every day. Avoid going long periods of time without eating.  Eat foods that are high in fiber, such as fresh fruits, vegetables, beans, and whole grains. Talk with your dietitian about how many servings of carbs you can eat at each meal.  Eat 4-6 oz (112-168 g) of lean protein each day, such as lean meat, chicken, fish, eggs, or tofu. One ounce (oz) of lean protein is equal to: ? 1 oz (28 g) of meat, chicken, or fish. ? 1 egg. ?  cup (62 g) of tofu.  Eat some foods each day that contain healthy fats, such as avocado, nuts, seeds, and fish.   What foods should I eat? Fruits Berries. Apples. Oranges. Peaches. Apricots. Plums. Grapes. Mango. Papaya. Pomegranate. Kiwi. Cherries. Vegetables Lettuce. Spinach. Leafy greens, including kale, chard, collard greens, and mustard greens. Beets. Cauliflower. Cabbage. Broccoli. Carrots. Green beans. Tomatoes. Peppers. Onions. Cucumbers. Brussels sprouts. Grains Whole grains, such as whole-wheat or whole-grain bread, crackers, tortillas, cereal, and pasta. Unsweetened oatmeal. Quinoa. Brown or wild rice. Meats and other proteins Seafood. Poultry without skin. Lean cuts of poultry and beef. Tofu. Nuts. Seeds. Dairy Low-fat or fat-free dairy products such as milk, yogurt, and cheese. The items listed above may not be a complete list of foods and beverages you can eat. Contact a dietitian for more information. What foods should I avoid? Fruits Fruits canned with  syrup. Vegetables Canned vegetables. Frozen vegetables with butter or cream sauce. Grains Refined white flour and flour products such as bread, pasta, snack foods, and cereals. Avoid all processed foods. Meats and other proteins Fatty cuts of meat. Poultry with skin. Breaded or fried meats. Processed meat. Avoid saturated fats. Dairy Full-fat yogurt, cheese, or milk. Beverages Sweetened drinks, such as soda or iced tea. The items listed above may not be a complete list of foods and beverages you should avoid. Contact a dietitian for more information. Questions to ask a health care provider  Do I need to meet with a diabetes educator?  Do I need to meet with a dietitian?  What number can I call if I have questions?  When are the best times to check my blood glucose? Where to find more information:  American Diabetes Association: diabetes.org  Academy of Nutrition and Dietetics: www.eatright.org  National Institute of Diabetes and Digestive and Kidney Diseases: www.niddk.nih.gov  Association of Diabetes Care and Education Specialists: www.diabeteseducator.org Summary  It is important to have healthy eating   habits because your blood sugar (glucose) levels are greatly affected by what you eat and drink.  A healthy meal plan will help you control your blood glucose and maintain a healthy lifestyle.  Your health care provider may recommend that you work with a dietitian to make a meal plan that is best for you.  Keep in mind that carbohydrates (carbs) and alcohol have immediate effects on your blood glucose levels. It is important to count carbs and to use alcohol carefully. This information is not intended to replace advice given to you by your health care provider. Make sure you discuss any questions you have with your health care provider. Document Revised: 04/13/2019 Document Reviewed: 04/13/2019 Elsevier Patient Education  2021 Elsevier Inc.  

## 2020-10-13 NOTE — Assessment & Plan Note (Signed)
BMI 38.57with diabetes and HTN.  Marland KitchenRecommended eating smaller high protein, low fat meals more frequently and exercising 30 mins a day 5 times a week with a goal of 10-15lb weight loss in the next 3 months. Patient voiced their understanding and motivation to adhere to these recommendations.

## 2020-10-13 NOTE — Assessment & Plan Note (Signed)
Ongoing with A1c improving today 6.2% and urine ALB 30.  Will continue Metformin 500 MG BID.  Educated him on medication and side effects.  He refuses glucometer or to check sugars at home.  Refuses diet education. Continue Gabapentin for neuropathy.  Recommend heavy focus on diet and cutting back on donuts and ice cream.  Return in 3 months.

## 2020-10-13 NOTE — Assessment & Plan Note (Signed)
Chronic, Olanzapine use.  Seen by psychiatry per his report, [facility], and reports his medications are being filled via them. Recent lipid panel, recheck today and continue collaboration with psychiatry.
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
## 2020-10-13 NOTE — Assessment & Plan Note (Signed)
Chronic, ongoing.  Trial low dose Rosuvastatin 5 MG daily and monitor LFT closely with this.  Lipid panel today.

## 2020-10-13 NOTE — Assessment & Plan Note (Signed)
Noted on imaging 06/23/2015.  Will start low dose Rosuvastatin 5 MG (monitor LFT with this) and adjust as needed.  Return in 3 months.

## 2020-10-13 NOTE — Assessment & Plan Note (Signed)
Noted on imaging 06/23/15.  Continue statin daily.  He refuses repeat imaging at this time.

## 2020-10-13 NOTE — Assessment & Plan Note (Signed)
Chronic, ongoing with BP at goal today.  Continue current medication regimen and adjust as needed.  He refuses to check BP at home.  CMP and TSH today.  Could consider addition of ACE or ARB in future for kidney protection as urine ALB 30 today, if patient agrees with plan.  Return in 3 months for follow-up.

## 2020-10-13 NOTE — Assessment & Plan Note (Signed)
Last GI visit 01/18/16, during which it appears he did not f/u.  Recent AST/ALT elevated 116/154, improved from previous and HCV not detected. He refuses to return to GI.  Return in 3 months.   Advised him to return sooner if symptoms present.

## 2020-10-14 LAB — COMPREHENSIVE METABOLIC PANEL
ALT: 76 IU/L — ABNORMAL HIGH (ref 0–44)
AST: 73 IU/L — ABNORMAL HIGH (ref 0–40)
Albumin/Globulin Ratio: 1.7 (ref 1.2–2.2)
Albumin: 4.3 g/dL (ref 3.8–4.8)
Alkaline Phosphatase: 52 IU/L (ref 44–121)
BUN/Creatinine Ratio: 11 (ref 10–24)
BUN: 10 mg/dL (ref 8–27)
Bilirubin Total: 0.7 mg/dL (ref 0.0–1.2)
CO2: 24 mmol/L (ref 20–29)
Calcium: 9.3 mg/dL (ref 8.6–10.2)
Chloride: 101 mmol/L (ref 96–106)
Creatinine, Ser: 0.88 mg/dL (ref 0.76–1.27)
Globulin, Total: 2.6 g/dL (ref 1.5–4.5)
Glucose: 173 mg/dL — ABNORMAL HIGH (ref 65–99)
Potassium: 4.3 mmol/L (ref 3.5–5.2)
Sodium: 141 mmol/L (ref 134–144)
Total Protein: 6.9 g/dL (ref 6.0–8.5)
eGFR: 94 mL/min/{1.73_m2} (ref >59)

## 2020-10-14 LAB — CBC WITH DIFFERENTIAL/PLATELET
Basophils Absolute: 0.1 10*3/uL (ref 0.0–0.2)
Basos: 1 %
EOS (ABSOLUTE): 0.2 10*3/uL (ref 0.0–0.4)
Eos: 2 %
Hematocrit: 43.8 % (ref 37.5–51.0)
Hemoglobin: 14.9 g/dL (ref 13.0–17.7)
Immature Grans (Abs): 0 10*3/uL (ref 0.0–0.1)
Immature Granulocytes: 0 %
Lymphocytes Absolute: 1.4 10*3/uL (ref 0.7–3.1)
Lymphs: 18 %
MCH: 30.6 pg (ref 26.6–33.0)
MCHC: 34 g/dL (ref 31.5–35.7)
MCV: 90 fL (ref 79–97)
Monocytes Absolute: 0.7 10*3/uL (ref 0.1–0.9)
Monocytes: 9 %
Neutrophils Absolute: 5.4 10*3/uL (ref 1.4–7.0)
Neutrophils: 70 %
Platelets: 272 10*3/uL (ref 150–450)
RBC: 4.87 x10E6/uL (ref 4.14–5.80)
RDW: 13.1 % (ref 11.6–15.4)
WBC: 7.8 10*3/uL (ref 3.4–10.8)

## 2020-10-14 LAB — LIPID PANEL W/O CHOL/HDL RATIO
Cholesterol, Total: 198 mg/dL (ref 100–199)
HDL: 35 mg/dL — ABNORMAL LOW (ref >39)
LDL Chol Calc (NIH): 145 mg/dL — ABNORMAL HIGH (ref 0–99)
Triglycerides: 96 mg/dL (ref 0–149)
VLDL Cholesterol Cal: 18 mg/dL (ref 5–40)

## 2020-10-14 LAB — PSA: Prostate Specific Ag, Serum: 0.6 ng/mL (ref 0.0–4.0)

## 2020-10-14 LAB — TSH: TSH: 1.29 u[IU]/mL (ref 0.450–4.500)

## 2020-10-15 NOTE — Progress Notes (Signed)
Good morning, please let Ryan [name] know her labs have returned.  Liver function is trending down and improving, suspect part of this is related to taking Metformin and better control of diabetes.  Great job!!  Kidney function is normal.  CBC shows no anemia.  Thyroid level normal.  Cholesterol levels show some elevation in bad cholesterol number still, please start taking Rosuvastatin as ordered and we will recheck this next visit.  If any issues with medication please let me know.  Any questions? Keep being awesome!!  Thank you for allowing me to participate in your care.  I appreciate you. Kindest regards, Chizaram Latino

## 2020-10-18 ENCOUNTER — Other Ambulatory Visit: Payer: Self-pay | Admitting: Nurse Practitioner

## 2020-11-03 ENCOUNTER — Telehealth: Payer: Self-pay | Admitting: Nurse Practitioner

## 2020-11-03 NOTE — Telephone Encounter (Signed)
Called pt Ryan [name] states that he would like for them to come to the home due to him not having a ride back and forth

## 2020-11-03 NOTE — Telephone Encounter (Signed)
Telephone visit scheduled for monday

## 2020-11-03 NOTE — Telephone Encounter (Addendum)
Pt seen Ryan [name] on 10-13-2020 and is calling to see if Ryan [name] would refer him for physical therapy due to years of neuropathy. Pt is having burning in his feet for years. Pt have trouble wearing shoes

## 2020-11-03 NOTE — Telephone Encounter (Signed)
Please advise 

## 2020-11-06 ENCOUNTER — Ambulatory Visit: Payer: Medicare Other | Admitting: Nurse Practitioner

## 2020-11-06 ENCOUNTER — Other Ambulatory Visit: Payer: Self-pay

## 2020-11-11 ENCOUNTER — Other Ambulatory Visit: Payer: Self-pay | Admitting: Nurse Practitioner

## 2020-11-11 NOTE — Telephone Encounter (Signed)
Requested Prescriptions  Pending Prescriptions Disp Refills  . atenolol (TENORMIN) 25 MG tablet [Pharmacy Med Name: ATENOLOL 25 MG TABLET] 30 tablet 0    Sig: TAKE 1 TABLET BY MOUTH EVERY DAY     Cardiovascular:  Beta Blockers Passed - 11/11/2020 11:36 AM      Passed - Last BP in normal range    BP Readings from Last 1 Encounters:  10/13/20 114/65         Passed - Last Heart Rate in normal range    Pulse Readings from Last 1 Encounters:  10/13/20 67         Passed - Valid encounter within last 6 months    Recent Outpatient Visits          4 weeks ago Type 2 diabetes mellitus with morbid obesity ([place])   [place], [name] T, NP   10 months ago Type 2 diabetes mellitus with morbid obesity ([place])   [place], [place] T, NP   1 year ago Paranoid schizophrenia ([place])   [place], [name], NP   1 year ago Pure hypercholesterolemia   [place], [place] T, NP   1 year ago Patient left without being seen   [place], Megan P, DO      Future Appointments            In 2 months [place], [name], NP [facility] [employer], [facility]

## 2020-11-12 ENCOUNTER — Other Ambulatory Visit: Payer: Self-pay | Admitting: Nurse Practitioner

## 2020-11-12 NOTE — Telephone Encounter (Signed)
Requested Prescriptions  Pending Prescriptions Disp Refills  . meloxicam (MOBIC) 15 MG tablet [Pharmacy Med Name: MELOXICAM 15 MG TABLET] 90 tablet 1    Sig: TAKE 1 TABLET BY MOUTH EVERY DAY     Analgesics:  COX2 Inhibitors Passed - 11/12/2020 12:46 AM      Passed - HGB in normal range and within 360 days    Hemoglobin  Date Value Ref Range Status  10/13/2020 14.9 13.0 - 17.7 g/dL Final         Passed - Cr in normal range and within 360 days    Creatinine  Date Value Ref Range Status  03/10/2014 0.79 0.60 - 1.30 mg/dL Final   Creatinine, Ser  Date Value Ref Range Status  10/13/2020 0.88 0.76 - 1.27 mg/dL Final         Passed - Patient is not pregnant      Passed - Valid encounter within last 12 months    Recent Outpatient Visits          1 month ago Type 2 diabetes mellitus with morbid obesity ([place])   [place], [name] T, NP   10 months ago Type 2 diabetes mellitus with morbid obesity ([place])   [place], Sunn[place]lope T, NP   1 year ago Paranoid schizophrenia ([place])   [place], [name], NP   1 year ago Pure hypercholesterolemia   [place], [place] T, NP   1 year ago Patient left without being seen   [place], Megan P, DO      Future Appointments            In 2 months Cannady, [name], NP [facility]

## 2020-11-15 ENCOUNTER — Ambulatory Visit (INDEPENDENT_AMBULATORY_CARE_PROVIDER_SITE_OTHER): Payer: Medicare Other | Admitting: Nurse Practitioner

## 2020-11-15 ENCOUNTER — Ambulatory Visit: Payer: Medicare Other | Admitting: Nurse Practitioner

## 2020-11-15 ENCOUNTER — Other Ambulatory Visit: Payer: Self-pay

## 2020-11-15 ENCOUNTER — Encounter: Payer: Self-pay | Admitting: Nurse Practitioner

## 2020-11-15 VITALS — BP 151/103 | HR 109 | Temp 97.0°F | Wt 228.0 lb

## 2020-11-15 DIAGNOSIS — R042 Hemoptysis: Secondary | ICD-10-CM | POA: Diagnosis not present

## 2020-11-15 LAB — CBC WITH DIFFERENTIAL/PLATELET
Hematocrit: 42.3 % (ref 37.5–51.0)
Hemoglobin: 13.8 g/dL (ref 13.0–17.7)
Lymphocytes Absolute: 1.7 10*3/uL (ref 0.7–3.1)
Lymphs: 17 %
MCH: 30.5 pg (ref 26.6–33.0)
MCHC: 32.6 g/dL (ref 31.5–35.7)
MCV: 94 fL (ref 79–97)
MID (Absolute): 0.8 10*3/uL (ref 0.1–1.6)
MID: 8 %
Neutrophils Absolute: 8 10*3/uL — ABNORMAL HIGH (ref 1.4–7.0)
Neutrophils: 76 %
Platelets: 319 10*3/uL (ref 150–450)
RBC: 4.52 x10E6/uL (ref 4.14–5.80)
RDW: 15.1 % (ref 11.6–15.4)
WBC: 10.5 10*3/uL (ref 3.4–10.8)

## 2020-11-15 NOTE — Assessment & Plan Note (Addendum)
Patient presented to the clinic with complaints of nose bleeds and coughing up blood.  Had called EMS to his house but refused to go to the ER because he had an appointment here today. Patient presented with dried blood on his mouth, nose, face, and clothes.  Due to the blood loss and unsure of the source of the blood it was recommended to the patient that we call EMS and he be seen in the ER. Patient became frustrated when I recommended this and he got up from the chair and exited the room before I could finish the conversation and left the office and stated "i'm not going to waste my time in the ER today".  STAT CBC showed normal H/H in office today.

## 2020-11-15 NOTE — Progress Notes (Signed)
BP (!) 151/103   Pulse (!) 109   Temp (!) 97 F (36.1 C) (Oral)   Wt 228 lb (103.4 kg)   SpO2 94%   BMI 38.53 kg/m    Subjective:    Patient ID: Ryan [name], adult    DOB: [birth year], 70 y.o.   MRN: [id number]  HPI: Cadin Luka is a 70 y.o. adult  Chief Complaint  Patient presents with   Epistaxis    Pt states he has had nose bleeds for the past 2 to 3 days   Patient states she has been having nose bleeds and coughing up blood for the past 2-3 days.  She has already called EMS who came to his house but she declined to go to the ER.  Patient states she couldn't get it to stop yesterday but today he hasn't had any blood.   Patient states he feels fine.   Denies SOB and fatigue.     Relevant past medical, surgical, family and social history reviewed and updated as indicated. Interim medical history since our last visit reviewed. Allergies and medications reviewed and updated.  Review of Systems  Constitutional:  Negative for fatigue.  HENT:  Positive for nosebleeds.   Respiratory:  Negative for shortness of breath.        Coughing up blood   Per HPI unless specifically indicated above     Objective:    BP (!) 151/103   Pulse (!) 109   Temp (!) 97 F (36.1 C) (Oral)   Wt 228 lb (103.4 kg)   SpO2 94%   BMI 38.53 kg/m   Wt Readings from Last 3 Encounters:  11/15/20 228 lb (103.4 kg)  10/13/20 228 lb 3.2 oz (103.5 kg)  01/10/20 229 lb (103.9 kg)    Physical Exam Vitals and nursing note reviewed.  Constitutional:      General: She is not in acute distress.    Appearance: She is obese. She is not ill-appearing, toxic-appearing or diaphoretic.     Comments: Patient looked fatigued. Had dried blood on his mouth, nose, face and clothes.   Patient presented to the office with no shoes and dirty clothes for visit today.   HENT:     Head: Normocephalic.     Right Ear: External ear normal.     Left Ear: External ear normal.     Nose: Nose normal.      Mouth/Throat:     Mouth: Mucous membranes are moist.     Pharynx: Oropharynx is clear.  Eyes:     General:        Right eye: No discharge.        Left eye: No discharge.     Extraocular Movements: Extraocular movements intact.     Conjunctiva/sclera: Conjunctivae normal.     Pupils: Pupils are equal, round, and reactive to light.  Cardiovascular:     Rate and Rhythm: Normal rate and regular rhythm.     Heart sounds: No murmur heard. Pulmonary:     Effort: Tachypnea present. No respiratory distress.     Breath sounds: Normal breath sounds. Decreased air movement present. No wheezing or rales.  Musculoskeletal:        General: No tenderness or signs of injury. Normal range of motion.     Cervical back: Normal range of motion and neck supple.     Right lower leg: Edema present.     Left lower leg: No edema.  Skin:  General: Skin is warm and dry.     Capillary Refill: Capillary refill takes less than 2 seconds.  Neurological:     General: No focal deficit present.     Mental Status: She is alert and oriented to person, place, and time. Mental status is at baseline.  Psychiatric:        Mood and Affect: Mood normal.        Behavior: Behavior normal.        Thought Content: Thought content normal.        Judgment: Judgment normal.    Results for orders placed or performed in visit on 11/15/20  CBC With Differential/Platelet (STAT)  Result Value Ref Range   WBC 10.5 3.4 - 10.8 x10E3/uL   RBC 4.52 4.14 - 5.80 x10E6/uL   Hemoglobin 13.8 13.0 - 17.7 g/dL   Hematocrit 42.3 37.5 - 51.0 %   MCV 94 79 - 97 fL   MCH 30.5 26.6 - 33.0 pg   MCHC 32.6 31.5 - 35.7 g/dL   RDW 15.1 11.6 - 15.4 %   Platelets 319 150 - 450 x10E3/uL   Neutrophils 76 Not Estab. %   Lymphs 17 Not Estab. %   MID 8 Not Estab. %   Neutrophils Absolute 8.0 (H) 1.4 - 7.0 x10E3/uL   Lymphocytes Absolute 1.7 0.7 - 3.1 x10E3/uL   MID (Absolute) 0.8 0.1 - 1.6 X10E3/uL   Hematology Comments: WILL FOLLOW        Assessment & Plan:   Problem List Items Addressed This Visit       Other   Coughing up blood - Primary    Patient presented to the clinic with complaints of nose bleeds and coughing up blood.  Had called EMS to his house but refused to go to the ER because he had an appointment here today. Patient presented with dried blood on his mouth, nose, face, and clothes.  Due to the blood loss and unsure of the source of the blood it was recommended to the patient that we call EMS and he be seen in the ER. Patient became frustrated when I recommended this and he got up from the chair and exited the room before I could finish the conversation and left the office and stated "i'm not going to waste my time in the ER today".  STAT CBC showed normal H/H in office today.        Relevant Orders   CBC With Differential/Platelet (STAT) (Completed)     Follow up plan: Return if symptoms worsen or fail to improve.   A total of 30 minutes were spent on this encounter today.  When total time is documented, this includes both the face-to-face and non-face-to-face time personally spent before, during and after the visit on the date of the encounter.

## 2020-11-15 NOTE — Progress Notes (Signed)
Please let patient know that the complete blood count that was drawn in the office today was normal. However, it is lower than 1 month ago. I do advise that he be seen in the ER due to how much bleeding he is experiencing.

## 2020-11-17 ENCOUNTER — Ambulatory Visit: Payer: Medicare Other | Admitting: Nurse Practitioner

## 2020-11-22 ENCOUNTER — Ambulatory Visit: Payer: Medicare Other | Admitting: Nurse Practitioner

## 2020-12-01 ENCOUNTER — Telehealth: Payer: Self-pay

## 2020-12-01 NOTE — Chronic Care Management (AMB) (Signed)
  Care Management   Note  12/01/2020 Name: Ryan [name] MRN: [id number] DOB: Nov 09, 1950  Ryan [name] is a 70 y.o. year old adult who is a primary care patient of [name], NP and is actively engaged with the care management team. I reached out to [name] by phone today to assist with re-scheduling a follow up visit with the Pharmacist  Follow up plan: Unsuccessful telephone outreach attempt made. A HIPAA compliant phone message was left for the patient providing contact information and requesting a return call.  The care management team will reach out to the patient again over the next 7 days.  If patient returns call to provider office, please advise to call [facility]  at [place], [place], [place], [place] [address] Direct Dial: [phone] [email or web address]: [email or web address]

## 2020-12-02 ENCOUNTER — Other Ambulatory Visit: Payer: Self-pay | Admitting: Nurse Practitioner

## 2020-12-02 NOTE — Telephone Encounter (Signed)
Requested Prescriptions  Pending Prescriptions Disp Refills  . atenolol (TENORMIN) 25 MG tablet [Pharmacy Med Name: ATENOLOL 25 MG TABLET] 90 tablet 0    Sig: TAKE 1 TABLET BY MOUTH EVERY DAY     Cardiovascular:  Beta Blockers Failed - 12/02/2020  4:13 PM      Failed - Last BP in normal range    BP Readings from Last 1 Encounters:  11/15/20 (!) 151/103         Passed - Last Heart Rate in normal range    Pulse Readings from Last 1 Encounters:  11/15/20 (!) 109         Passed - Valid encounter within last 6 months    Recent Outpatient Visits          2 weeks ago Coughing up blood   [place], Santiago Glad, NP   1 month ago Type 2 diabetes mellitus with morbid obesity ([place])   [place], [name] T, NP   10 months ago Type 2 diabetes mellitus with morbid obesity ([place])   [place] Cannady, Henrine Screws T, NP   1 year ago Paranoid schizophrenia ([place])   [place], [name], NP   1 year ago Pure hypercholesterolemia   [place], [name], NP      Future Appointments            In 1 month Cannady, [name], NP [facility]

## 2020-12-06 ENCOUNTER — Telehealth: Payer: Self-pay

## 2020-12-19 NOTE — Chronic Care Management (AMB) (Signed)
  Care Management   Note  12/19/2020 Name: Ryan [name] MRN: [id number] DOB: Oct 03, 1950  Ryan [name] is a 70 y.o. year old adult who is a primary care patient of [name], NP and is actively engaged with the care management team. I reached out to [name] by phone today to assist with re-scheduling a follow up visit with the Pharmacist  Follow up plan: Telephone appointment with care management team member scheduled for:01/01/2021  Noreene Larsson, [place], [facility]  [place],  [address] Direct Dial: [phone] [email or web address]: [email or web address]

## 2021-01-01 ENCOUNTER — Telehealth: Payer: Self-pay

## 2021-01-01 ENCOUNTER — Telehealth: Payer: Medicare Other

## 2021-01-01 NOTE — Telephone Encounter (Signed)
  Chronic Care Management  Outreach Note   Name: Ryan [name] MRN: [id number] DOB: 1950-12-06  Referred by: [name], NP Reason for referral: Telephone Appointment with Clinical Pharmacist, Madelin Rear.   Telephone appointment with clinical pharmacist today (01/01/2021) at 11:15 am. Attempted to carryout f/u visit for review of cholesterol/DM/HTN - patient declined visit and ended the call.   Madelin Rear, PharmD, CPP Clinical Pharmacist Practitioner  [phone]

## 2021-01-05 ENCOUNTER — Ambulatory Visit: Payer: Self-pay

## 2021-01-05 NOTE — Telephone Encounter (Signed)
Patient called to ask about the pain he was having and the not wanting to speak to a triage nurse. He says he was having body aches from sleeping under a fan. He says he spoke to his friend and was told that if you have arthritis not to sleep in the cold, so he says that he turned his thermostat to 74 and now he's better, no pain and able to walk normally with his cane. He says he's fine and appreciated me checking on him. I advised of his upcoming appointment on 01/16/21 at [facility], he wrote the appointment down and says he will be there.    Message from Lennox Solders sent at 01/05/2021 10:52 AM EDT  Pt is calling back and states he does not need to speak with triage nurse. Pt was sleeping under ceiling fan and he has arthritis   ----- Message from Valere Dross sent at 01/05/2021 10:05 AM EDT -----  Pt called in stating his finger has started to hurt and would like someone to call him to see about what he can do. Please advise

## 2021-01-11 ENCOUNTER — Other Ambulatory Visit: Payer: Self-pay | Admitting: Nurse Practitioner

## 2021-01-13 ENCOUNTER — Other Ambulatory Visit: Payer: Self-pay | Admitting: Nurse Practitioner

## 2021-01-13 NOTE — Progress Notes (Signed)
Error

## 2021-01-16 ENCOUNTER — Ambulatory Visit: Payer: Medicare Other | Admitting: Nurse Practitioner

## 2021-01-29 ENCOUNTER — Telehealth: Payer: Self-pay | Admitting: Nurse Practitioner

## 2021-01-29 NOTE — Telephone Encounter (Signed)
Pt is calling to report that she fell yesterday. And she would like orders to resume PT. Please advise [phone]

## 2021-01-30 NOTE — Telephone Encounter (Signed)
Called and notified patient of [name]'s message.

## 2021-02-06 ENCOUNTER — Ambulatory Visit: Payer: Self-pay | Admitting: *Deleted

## 2021-02-06 NOTE — Telephone Encounter (Signed)
Pt fell Friday and cut his head over left eye.  He had called in to change his appt which the agent did.   But since he mentioned falling and hitting his head it was transferred to me.   This is his second fall.   He mentioned he fell and hit his head on a coffee table.   He got a cut over his left eye.    It finally stopped bleeding.   No dizziness or headaches.   He denies any other symptoms.  "I'm fine".    I can't hardly walk without a walker.   I have neuropathy in my feet which is why I need to see someone".   "They are going to start PT with me".  "I have diabetes".  "That's all I needed was to change my appt".    He did not want to proceed so since he wasn't having any symptoms the call was ended.  Agent changed his appt to 02/12/2021 with [name], NP.    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 MECHANISM: "How did the fall happen?"     He fell Friday and hit his head on the coffee table sustaining a cut over his left eye.   He got it stopped bleeding.   "It's almost healed now".  I'm fine".   Pt stated he has neuropathy in his feet real bad from diabetes.   He uses a walker because he is having trouble with falling and walking.  He is seeing his provider to be set up for PT per pt.  2. DOMESTIC VIOLENCE AND ELDER ABUSE SCREENING: "Did you fall because someone pushed you or tried to hurt you?" If Yes, ask: "Are you safe now?"     Not asked after he told me why he fell. 3. ONSET: "When did the fall happen?" (e.g., minutes, hours, or days ago)     Last Friday 4. LOCATION: "What part of the body hit the ground?" (e.g., back, buttocks, head, hips, knees, hands, head, stomach)     Hit his head on a coffee table 5. INJURY: "Did you hurt (injure) yourself when you fell?" If Yes, ask: "What did you injure? Tell me more about this?" (e.g., body  area; type of injury; pain severity)"     Cut over his left eye. 6. PAIN: "Is there any pain?" If Yes, ask: "How bad is the pain?" (e.g., Scale 1-10; or mild,  moderate, severe)   - NONE (0): No pain   - MILD (1-3): Doesn't interfere with normal activities    - MODERATE (4-7): Interferes with normal activities or awakens from sleep    - SEVERE (8-10): Excruciating pain, unable to do any normal activities      "It's almost healed now".  7. SIZE: For cuts, bruises, or swelling, ask: "How large is it?" (e.g., inches or centimeters)      "It's almost healed now" was his response to the injury.   Denied being dizzy. 8. PREGNANCY: "Is there any chance you are pregnant?" "When was your last menstrual period?"     N/A 9. OTHER SYMPTOMS: "Do you have any other symptoms?" (e.g., dizziness, fever, weakness; new onset or worsening).      Denied all other symptoms when asked.    "I have neuropathy in my feet from the diabetes. 10.  CAUSE: "What do you think caused the fall (or falling)?" (e.g., tripped, dizzy spell)       Neuropathy from diabetes  Protocols used: Falls and [facility]

## 2021-02-06 NOTE — Telephone Encounter (Signed)
Pt called in to change his appt which was done by the agent but since he mentioned he hit his head from a fall he was transferred to me.   See triage notes. I sent my notes to [facility] for [name], NP

## 2021-02-06 NOTE — Telephone Encounter (Signed)
Noted, will discuss further at visit.

## 2021-02-12 ENCOUNTER — Other Ambulatory Visit: Payer: Self-pay

## 2021-02-12 ENCOUNTER — Ambulatory Visit (INDEPENDENT_AMBULATORY_CARE_PROVIDER_SITE_OTHER): Payer: Medicare Other | Admitting: Nurse Practitioner

## 2021-02-12 ENCOUNTER — Other Ambulatory Visit: Payer: Self-pay | Admitting: Nurse Practitioner

## 2021-02-12 ENCOUNTER — Encounter: Payer: Self-pay | Admitting: Nurse Practitioner

## 2021-02-12 DIAGNOSIS — G63 Polyneuropathy in diseases classified elsewhere: Secondary | ICD-10-CM

## 2021-02-12 DIAGNOSIS — B182 Chronic viral hepatitis C: Secondary | ICD-10-CM | POA: Diagnosis not present

## 2021-02-12 DIAGNOSIS — R296 Repeated falls: Secondary | ICD-10-CM | POA: Diagnosis not present

## 2021-02-12 DIAGNOSIS — E538 Deficiency of other specified B group vitamins: Secondary | ICD-10-CM

## 2021-02-12 DIAGNOSIS — F200 Paranoid schizophrenia: Secondary | ICD-10-CM

## 2021-02-12 DIAGNOSIS — M25512 Pain in left shoulder: Secondary | ICD-10-CM

## 2021-02-12 DIAGNOSIS — E785 Hyperlipidemia, unspecified: Secondary | ICD-10-CM

## 2021-02-12 DIAGNOSIS — E1159 Type 2 diabetes mellitus with other circulatory complications: Secondary | ICD-10-CM

## 2021-02-12 DIAGNOSIS — Z23 Encounter for immunization: Secondary | ICD-10-CM | POA: Diagnosis not present

## 2021-02-12 DIAGNOSIS — E1169 Type 2 diabetes mellitus with other specified complication: Secondary | ICD-10-CM | POA: Diagnosis not present

## 2021-02-12 DIAGNOSIS — I152 Hypertension secondary to endocrine disorders: Secondary | ICD-10-CM | POA: Diagnosis not present

## 2021-02-12 DIAGNOSIS — Z79899 Other long term (current) drug therapy: Secondary | ICD-10-CM | POA: Diagnosis not present

## 2021-02-12 LAB — BAYER DCA HB A1C WAIVED: HB A1C (BAYER DCA - WAIVED): 5.8 % — ABNORMAL HIGH (ref 4.8–5.6)

## 2021-02-12 MED ORDER — MELOXICAM 15 MG PO TABS: 15.0000 mg | ORAL_TABLET | Freq: Every day | ORAL | 1 refills | Status: DC

## 2021-02-12 MED ORDER — GABAPENTIN 300 MG PO CAPS: 300.0000 mg | ORAL_CAPSULE | Freq: Three times a day (TID) | ORAL | 4 refills | Status: AC

## 2021-02-12 MED ORDER — METFORMIN HCL 500 MG PO TABS: ORAL_TABLET | ORAL | 4 refills | Status: AC

## 2021-02-12 MED ORDER — TIZANIDINE HCL 2 MG PO CAPS
2.0000 mg | ORAL_CAPSULE | Freq: Two times a day (BID) | ORAL | 0 refills | Status: DC | PRN
Start: 2021-02-12 — End: 2021-02-13

## 2021-02-12 NOTE — Assessment & Plan Note (Signed)
BMI 38.53 with T2DM, HTN/HLD.  Recommended eating smaller high protein, low fat meals more frequently and exercising 30 mins a day 5 times a week with a goal of 10-15lb weight loss in the next 3 months. Patient voiced their understanding and motivation to adhere to these recommendations.

## 2021-02-12 NOTE — Assessment & Plan Note (Signed)
Ongoing issue with underlying poor gait and neuropathy -- referral back to home PT.

## 2021-02-12 NOTE — Assessment & Plan Note (Signed)
Chronic, Olanzapine use.  Seen by psychiatry per his report, [facility], and reports his medications are being filled via them. Recent lipid panel, recheck today and continue collaboration with psychiatry.

## 2021-02-12 NOTE — Assessment & Plan Note (Signed)
Chronic, ongoing.  Continue Rosuvastatin 5 MG daily and monitor LFT closely with this.  Lipid panel today.

## 2021-02-12 NOTE — Assessment & Plan Note (Addendum)
Chronic, ongoing.  Increase Gabapentin to 300 MG TID and referral to neurology placed due to worsening symptoms and poor gait.  Refer to diabetes plan.  B12 recently normal range and diabetes well-controlled at this time.  Home Pt ordered to continue strengthening and fall prevention.  Return in 3 months.

## 2021-02-12 NOTE — Assessment & Plan Note (Signed)
Last GI visit 01/18/16, during which it appears he did not f/u.  Recent AST/ALT elevated 76/73, improved from previous and HCV not detected. He refuses to return to GI.  Return in 3 months.   Advised him to return sooner if symptoms present.

## 2021-02-12 NOTE — Assessment & Plan Note (Signed)
Ongoing with A1c improving today 5.8% and urine ALB 30 last visit.  Will continue Metformin 500 MG, but change to once daily dosing since doing well with diet changes.  Educated him on medication and side effects.  He refuses glucometer or to check sugars at home.  Refuses diet education. Continue Gabapentin for neuropathy.  Recommend heavy focus on diet and cutting back on donuts and ice cream.  Return in 3 months.
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
## 2021-02-12 NOTE — Patient Instructions (Signed)
Diabetes Mellitus and Nutrition, Adult When you have diabetes, or diabetes mellitus, it is very important to have healthy eating habits because your blood sugar (glucose) levels are greatly affected by what you eat and drink. Eating healthy foods in the right amounts, at about the same times every day, can help you:  Control your blood glucose.  Lower your risk of heart disease.  Improve your blood pressure.  Reach or maintain a healthy weight. What can affect my meal plan? Every person with diabetes is different, and each person has different needs for a meal plan. Your health care provider may recommend that you work with a dietitian to make a meal plan that is best for you. Your meal plan may vary depending on factors such as:  The calories you need.  The medicines you take.  Your weight.  Your blood glucose, blood pressure, and cholesterol levels.  Your activity level.  Other health conditions you have, such as heart or kidney disease. How do carbohydrates affect me? Carbohydrates, also called carbs, affect your blood glucose level more than any other type of food. Eating carbs naturally raises the amount of glucose in your blood. Carb counting is a method for keeping track of how many carbs you eat. Counting carbs is important to keep your blood glucose at a healthy level, especially if you use insulin or take certain oral diabetes medicines. It is important to know how many carbs you can safely have in each meal. This is different for every person. Your dietitian can help you calculate how many carbs you should have at each meal and for each snack. How does alcohol affect me? Alcohol can cause a sudden decrease in blood glucose (hypoglycemia), especially if you use insulin or take certain oral diabetes medicines. Hypoglycemia can be a life-threatening condition. Symptoms of hypoglycemia, such as sleepiness, dizziness, and confusion, are similar to symptoms of having too much  alcohol.  Do not drink alcohol if: ? Your health care provider tells you not to drink. ? You are pregnant, may be pregnant, or are planning to become pregnant.  If you drink alcohol: ? Do not drink on an empty stomach. ? Limit how much you use to:  0-1 drink a day for women.  0-2 drinks a day for men. ? Be aware of how much alcohol is in your drink. In the [place] one drink equals one 12 oz bottle of beer (355 mL), one 5 oz glass of wine (148 mL), or one 1 oz glass of hard liquor (44 mL). ? Keep yourself hydrated with water, diet soda, or unsweetened iced tea.  Keep in mind that regular soda, juice, and other mixers may contain a lot of sugar and must be counted as carbs. What are tips for following this plan? Reading food labels  Start by checking the serving size on the "Nutrition Facts" label of packaged foods and drinks. The amount of calories, carbs, fats, and other nutrients listed on the label is based on one serving of the item. Many items contain more than one serving per package.  Check the total grams (g) of carbs in one serving. You can calculate the number of servings of carbs in one serving by dividing the total carbs by 15. For example, if a food has 30 g of total carbs per serving, it would be equal to 2 servings of carbs.  Check the number of grams (g) of saturated fats and trans fats in one serving. Choose foods that have   a low amount or none of these fats.  Check the number of milligrams (mg) of salt (sodium) in one serving. Most people should limit total sodium intake to less than 2,300 mg per day.  Always check the nutrition information of foods labeled as "low-fat" or "nonfat." These foods may be higher in added sugar or refined carbs and should be avoided.  Talk to your dietitian to identify your daily goals for nutrients listed on the label. Shopping  Avoid buying canned, pre-made, or processed foods. These foods tend to be high in fat, sodium, and added  sugar.  Shop around the outside edge of the grocery store. This is where you will most often find fresh fruits and vegetables, bulk grains, fresh meats, and fresh dairy. Cooking  Use low-heat cooking methods, such as baking, instead of high-heat cooking methods like deep frying.  Cook using healthy oils, such as olive, canola, or sunflower oil.  Avoid cooking with butter, cream, or high-fat meats. Meal planning  Eat meals and snacks regularly, preferably at the same times every day. Avoid going long periods of time without eating.  Eat foods that are high in fiber, such as fresh fruits, vegetables, beans, and whole grains. Talk with your dietitian about how many servings of carbs you can eat at each meal.  Eat 4-6 oz (112-168 g) of lean protein each day, such as lean meat, chicken, fish, eggs, or tofu. One ounce (oz) of lean protein is equal to: ? 1 oz (28 g) of meat, chicken, or fish. ? 1 egg. ?  cup (62 g) of tofu.  Eat some foods each day that contain healthy fats, such as avocado, nuts, seeds, and fish.   What foods should I eat? Fruits Berries. Apples. Oranges. Peaches. Apricots. Plums. Grapes. Mango. Papaya. Pomegranate. Kiwi. Cherries. Vegetables Lettuce. Spinach. Leafy greens, including kale, chard, collard greens, and mustard greens. Beets. Cauliflower. Cabbage. Broccoli. Carrots. Green beans. Tomatoes. Peppers. Onions. Cucumbers. Brussels sprouts. Grains Whole grains, such as whole-wheat or whole-grain bread, crackers, tortillas, cereal, and pasta. Unsweetened oatmeal. Quinoa. Brown or wild rice. Meats and other proteins Seafood. Poultry without skin. Lean cuts of poultry and beef. Tofu. Nuts. Seeds. Dairy Low-fat or fat-free dairy products such as milk, yogurt, and cheese. The items listed above may not be a complete list of foods and beverages you can eat. Contact a dietitian for more information. What foods should I avoid? Fruits Fruits canned with  syrup. Vegetables Canned vegetables. Frozen vegetables with butter or cream sauce. Grains Refined white flour and flour products such as bread, pasta, snack foods, and cereals. Avoid all processed foods. Meats and other proteins Fatty cuts of meat. Poultry with skin. Breaded or fried meats. Processed meat. Avoid saturated fats. Dairy Full-fat yogurt, cheese, or milk. Beverages Sweetened drinks, such as soda or iced tea. The items listed above may not be a complete list of foods and beverages you should avoid. Contact a dietitian for more information. Questions to ask a health care provider  Do I need to meet with a diabetes educator?  Do I need to meet with a dietitian?  What number can I call if I have questions?  When are the best times to check my blood glucose? Where to find more information:  American Diabetes Association: diabetes.org  Academy of Nutrition and Dietetics: www.eatright.org  National Institute of Diabetes and Digestive and Kidney Diseases: www.niddk.nih.gov  Association of Diabetes Care and Education Specialists: www.diabeteseducator.org Summary  It is important to have healthy eating   habits because your blood sugar (glucose) levels are greatly affected by what you eat and drink.  A healthy meal plan will help you control your blood glucose and maintain a healthy lifestyle.  Your health care provider may recommend that you work with a dietitian to make a meal plan that is best for you.  Keep in mind that carbohydrates (carbs) and alcohol have immediate effects on your blood glucose levels. It is important to count carbs and to use alcohol carefully. This information is not intended to replace advice given to you by your health care provider. Make sure you discuss any questions you have with your health care provider. Document Revised: 04/13/2019 Document Reviewed: 04/13/2019 Elsevier Patient Education  2021 Elsevier Inc.  

## 2021-02-12 NOTE — Assessment & Plan Note (Signed)
Chronic, ongoing with BP at goal today.  Continue current medication regimen and adjust as needed.  He refuses to check BP at home.  CMP and CBC today.  Could consider addition of ACE or ARB in future for kidney protection as urine ALB 30 last visit, if patient agrees with plan -- at this time he refuses.  Return in 3 months for follow-up.

## 2021-02-13 ENCOUNTER — Other Ambulatory Visit: Payer: Self-pay | Admitting: Nurse Practitioner

## 2021-02-13 ENCOUNTER — Telehealth: Payer: Self-pay

## 2021-02-13 LAB — CBC WITH DIFFERENTIAL/PLATELET
Basophils Absolute: 0.1 10*3/uL (ref 0.0–0.2)
Basos: 1 %
EOS (ABSOLUTE): 0.2 10*3/uL (ref 0.0–0.4)
Eos: 2 %
Hematocrit: 39.9 % (ref 37.5–51.0)
Hemoglobin: 13.1 g/dL (ref 13.0–17.7)
Immature Grans (Abs): 0 10*3/uL (ref 0.0–0.1)
Immature Granulocytes: 0 %
Lymphocytes Absolute: 1.7 10*3/uL (ref 0.7–3.1)
Lymphs: 22 %
MCH: 27.7 pg (ref 26.6–33.0)
MCHC: 32.8 g/dL (ref 31.5–35.7)
MCV: 84 fL (ref 79–97)
Monocytes Absolute: 0.8 10*3/uL (ref 0.1–0.9)
Monocytes: 10 %
Neutrophils Absolute: 4.9 10*3/uL (ref 1.4–7.0)
Neutrophils: 65 %
Platelets: 304 10*3/uL (ref 150–450)
RBC: 4.73 x10E6/uL (ref 4.14–5.80)
RDW: 13.5 % (ref 11.6–15.4)
WBC: 7.5 10*3/uL (ref 3.4–10.8)

## 2021-02-13 LAB — COMPREHENSIVE METABOLIC PANEL
ALT: 63 IU/L — ABNORMAL HIGH (ref 0–44)
AST: 87 IU/L — ABNORMAL HIGH (ref 0–40)
Albumin/Globulin Ratio: 1.8 (ref 1.2–2.2)
Albumin: 4.4 g/dL (ref 3.8–4.8)
Alkaline Phosphatase: 60 IU/L (ref 44–121)
BUN/Creatinine Ratio: 11 (ref 10–24)
BUN: 8 mg/dL (ref 8–27)
Bilirubin Total: 0.7 mg/dL (ref 0.0–1.2)
CO2: 22 mmol/L (ref 20–29)
Calcium: 9.8 mg/dL (ref 8.6–10.2)
Chloride: 102 mmol/L (ref 96–106)
Creatinine, Ser: 0.74 mg/dL — ABNORMAL LOW (ref 0.76–1.27)
Globulin, Total: 2.5 g/dL (ref 1.5–4.5)
Glucose: 93 mg/dL (ref 70–99)
Potassium: 4.6 mmol/L (ref 3.5–5.2)
Sodium: 145 mmol/L — ABNORMAL HIGH (ref 134–144)
Total Protein: 6.9 g/dL (ref 6.0–8.5)
eGFR: 99 mL/min/{1.73_m2} (ref >59)

## 2021-02-13 LAB — LIPID PANEL W/O CHOL/HDL RATIO
Cholesterol, Total: 152 mg/dL (ref 100–199)
HDL: 30 mg/dL — ABNORMAL LOW (ref >39)
LDL Chol Calc (NIH): 98 mg/dL (ref 0–99)
Triglycerides: 136 mg/dL (ref 0–149)
VLDL Cholesterol Cal: 24 mg/dL (ref 5–40)

## 2021-02-13 LAB — VITAMIN B12: Vitamin B-12: 528 pg/mL (ref 232–1245)

## 2021-02-13 NOTE — Telephone Encounter (Signed)
Left message for patient to call back to discuss lab results and recommendations.

## 2021-02-13 NOTE — Telephone Encounter (Signed)
Patient called back for lab results no answer at the office please call Ph#  [phone]

## 2021-02-13 NOTE — Telephone Encounter (Signed)
Informed patient of results

## 2021-02-13 NOTE — Progress Notes (Signed)
Good morning, please let Ryan [name] know labs have returned: - CBC is normal with no anemia - Kidney function normal and liver function continues to show mild elevations, but these are remaining stable and will continue to monitor -- may benefit from repeat liver imaging in future.   - Sodium level is a little elevated, salt, please reduce salt intake and increase water intake -- will recheck next visit. - Cholesterol levels improving with Rosuvastatin on board, continue this daily for stroke prevention - B12 level normal.  Any questions? Keep being amazing!!  Thank you for allowing me to participate in your care.  I appreciate you. Kindest regards, Fortune Torosian

## 2021-02-13 NOTE — Telephone Encounter (Signed)
-----   Message from [name], NP sent at 02/13/2021  8:07 AM EDT ----- Good morning, please let [place] know labs have returned: - CBC is normal with no anemia - Kidney function normal and liver function continues to show mild elevations, but these are remaining stable and will continue to monitor -- may benefit from repeat liver imaging in future.   - Sodium level is a little elevated, salt, please reduce salt intake and increase water intake -- will recheck next visit. - Cholesterol levels improving with Rosuvastatin on board, continue this daily for stroke prevention - B12 level normal.  Any questions? Keep being amazing!!  Thank you for allowing me to participate in your care.  I appreciate you. Kindest regards, [name]

## 2021-02-13 NOTE — Telephone Encounter (Signed)
Requested medication (s) are due for refill today:   Provider to review  Requested medication (s) are on the active medication list:   Yes  Future visit scheduled:   Yes   Last ordered: 02/12/2021 #60, 0 refills  Non delegated refill   Requested Prescriptions  Pending Prescriptions Disp Refills   tizanidine (ZANAFLEX) 2 MG capsule [Pharmacy Med Name: TIZANIDINE HCL 2 MG CAPSULE] 60 capsule 0    Sig: Take 1 capsule (2 mg total) by mouth 2 (two) times daily as needed for muscle spasms.     Not Delegated - Cardiovascular:  Alpha-2 Agonists - tizanidine Failed - 02/12/2021  2:39 PM      Failed - This refill cannot be delegated      Passed - Valid encounter within last 6 months    Recent Outpatient Visits           Yesterday Type 2 diabetes mellitus with morbid obesity ([place])   [place], [place] T, NP   3 months ago Coughing up blood   [place], Santiago Glad, NP   4 months ago Type 2 diabetes mellitus with morbid obesity ([place])   [place], [place] T, NP   1 year ago Type 2 diabetes mellitus with morbid obesity ([place])   [place], [name], NP   1 year ago Paranoid schizophrenia ([place])   [place], [name], NP       Future Appointments             In 3 months Cannady, [name], NP [facility]

## 2021-02-14 ENCOUNTER — Other Ambulatory Visit: Payer: Self-pay | Admitting: Nurse Practitioner

## 2021-02-14 ENCOUNTER — Ambulatory Visit: Payer: Medicare Other | Admitting: Nurse Practitioner

## 2021-02-14 MED ORDER — BACLOFEN 10 MG PO TABS: 5.0000 mg | ORAL_TABLET | Freq: Two times a day (BID) | ORAL | 0 refills | Status: DC | PRN

## 2021-02-14 NOTE — Telephone Encounter (Signed)
Requested medication (s) are due for refill today:   Provider to review  Requested medication (s) are on the active medication list:   Yes  Future visit scheduled:   Yes   Last ordered: 02/13/2021 #60, 0 refills  Non delegated refill     An alternative is being requested.   Requested Prescriptions  Pending Prescriptions Disp Refills   tiZANidine (ZANAFLEX) 2 MG tablet [Pharmacy Med Name: TIZANIDINE HCL 2 MG TABLET]  0     Not Delegated - Cardiovascular:  Alpha-2 Agonists - tizanidine Failed - 02/13/2021 10:44 AM      Failed - This refill cannot be delegated      Passed - Valid encounter within last 6 months    Recent Outpatient Visits           2 days ago Type 2 diabetes mellitus with morbid obesity ([place])   [place], [place] T, NP   3 months ago Coughing up blood   [place], Santiago Glad, NP   4 months ago Type 2 diabetes mellitus with morbid obesity ([place])   [place], Fortine T, NP   1 year ago Type 2 diabetes mellitus with morbid obesity ([place])   [place], [name], NP   1 year ago Paranoid schizophrenia ([place])   [place], [name], NP       Future Appointments             In 3 months Cannady, [name], NP [facility]

## 2021-02-16 NOTE — Telephone Encounter (Signed)
Pt stated that nobody has come out to their house yet, pt stated they need a home health aid to come to their house as soon as possible.

## 2021-02-16 NOTE — Telephone Encounter (Signed)
Any new updates on this referral?

## 2021-02-17 ENCOUNTER — Emergency Department: Payer: Medicare Other

## 2021-02-17 ENCOUNTER — Encounter: Payer: Self-pay | Admitting: Intensive Care

## 2021-02-17 ENCOUNTER — Other Ambulatory Visit: Payer: Self-pay

## 2021-02-17 ENCOUNTER — Inpatient Hospital Stay
Admission: EM | Admit: 2021-02-17 | Discharge: 2021-02-20 | DRG: 644 | Disposition: A | Discharge status: Home Health | Payer: Medicare Other | Attending: Internal Medicine | Admitting: Internal Medicine

## 2021-02-17 DIAGNOSIS — E1142 Type 2 diabetes mellitus with diabetic polyneuropathy: Secondary | ICD-10-CM | POA: Diagnosis not present

## 2021-02-17 DIAGNOSIS — M4800 Spinal stenosis, site unspecified: Secondary | ICD-10-CM | POA: Diagnosis present

## 2021-02-17 DIAGNOSIS — R29898 Other symptoms and signs involving the musculoskeletal system: Secondary | ICD-10-CM | POA: Diagnosis present

## 2021-02-17 DIAGNOSIS — E785 Hyperlipidemia, unspecified: Secondary | ICD-10-CM | POA: Diagnosis present

## 2021-02-17 DIAGNOSIS — E1169 Type 2 diabetes mellitus with other specified complication: Secondary | ICD-10-CM | POA: Diagnosis not present

## 2021-02-17 DIAGNOSIS — G319 Degenerative disease of nervous system, unspecified: Secondary | ICD-10-CM | POA: Diagnosis not present

## 2021-02-17 DIAGNOSIS — R531 Weakness: Secondary | ICD-10-CM

## 2021-02-17 DIAGNOSIS — I739 Peripheral vascular disease, unspecified: Secondary | ICD-10-CM | POA: Diagnosis not present

## 2021-02-17 DIAGNOSIS — G8929 Other chronic pain: Secondary | ICD-10-CM | POA: Diagnosis present

## 2021-02-17 DIAGNOSIS — R911 Solitary pulmonary nodule: Secondary | ICD-10-CM | POA: Diagnosis not present

## 2021-02-17 DIAGNOSIS — Z981 Arthrodesis status: Secondary | ICD-10-CM

## 2021-02-17 DIAGNOSIS — E041 Nontoxic single thyroid nodule: Secondary | ICD-10-CM | POA: Diagnosis not present

## 2021-02-17 DIAGNOSIS — J9811 Atelectasis: Secondary | ICD-10-CM | POA: Diagnosis not present

## 2021-02-17 DIAGNOSIS — M47814 Spondylosis without myelopathy or radiculopathy, thoracic region: Secondary | ICD-10-CM | POA: Diagnosis not present

## 2021-02-17 DIAGNOSIS — Z20822 Contact with and (suspected) exposure to covid-19: Secondary | ICD-10-CM | POA: Diagnosis present

## 2021-02-17 DIAGNOSIS — M62511 Muscle wasting and atrophy, not elsewhere classified, right shoulder: Secondary | ICD-10-CM | POA: Diagnosis not present

## 2021-02-17 DIAGNOSIS — I152 Hypertension secondary to endocrine disorders: Secondary | ICD-10-CM | POA: Diagnosis not present

## 2021-02-17 DIAGNOSIS — E1159 Type 2 diabetes mellitus with other circulatory complications: Secondary | ICD-10-CM | POA: Diagnosis not present

## 2021-02-17 DIAGNOSIS — Z89211 Acquired absence of right upper limb below elbow: Secondary | ICD-10-CM | POA: Diagnosis not present

## 2021-02-17 DIAGNOSIS — Z7984 Long term (current) use of oral hypoglycemic drugs: Secondary | ICD-10-CM

## 2021-02-17 DIAGNOSIS — M4804 Spinal stenosis, thoracic region: Secondary | ICD-10-CM | POA: Diagnosis present

## 2021-02-17 DIAGNOSIS — Z79899 Other long term (current) drug therapy: Secondary | ICD-10-CM

## 2021-02-17 DIAGNOSIS — R262 Difficulty in walking, not elsewhere classified: Secondary | ICD-10-CM | POA: Diagnosis present

## 2021-02-17 DIAGNOSIS — M48062 Spinal stenosis, lumbar region with neurogenic claudication: Secondary | ICD-10-CM | POA: Diagnosis not present

## 2021-02-17 DIAGNOSIS — Z8249 Family history of ischemic heart disease and other diseases of the circulatory system: Secondary | ICD-10-CM

## 2021-02-17 DIAGNOSIS — I6523 Occlusion and stenosis of bilateral carotid arteries: Secondary | ICD-10-CM | POA: Diagnosis not present

## 2021-02-17 DIAGNOSIS — Z83438 Family history of other disorder of lipoprotein metabolism and other lipidemia: Secondary | ICD-10-CM

## 2021-02-17 DIAGNOSIS — M5136 Other intervertebral disc degeneration, lumbar region: Secondary | ICD-10-CM | POA: Diagnosis not present

## 2021-02-17 DIAGNOSIS — Z7982 Long term (current) use of aspirin: Secondary | ICD-10-CM

## 2021-02-17 DIAGNOSIS — M48061 Spinal stenosis, lumbar region without neurogenic claudication: Secondary | ICD-10-CM | POA: Diagnosis present

## 2021-02-17 DIAGNOSIS — Z9081 Acquired absence of spleen: Secondary | ICD-10-CM | POA: Diagnosis not present

## 2021-02-17 DIAGNOSIS — B1920 Unspecified viral hepatitis C without hepatic coma: Secondary | ICD-10-CM | POA: Diagnosis present

## 2021-02-17 DIAGNOSIS — G629 Polyneuropathy, unspecified: Secondary | ICD-10-CM

## 2021-02-17 DIAGNOSIS — M5126 Other intervertebral disc displacement, lumbar region: Secondary | ICD-10-CM | POA: Diagnosis not present

## 2021-02-17 DIAGNOSIS — Z87891 Personal history of nicotine dependence: Secondary | ICD-10-CM | POA: Diagnosis not present

## 2021-02-17 DIAGNOSIS — Z6841 Body Mass Index (BMI) 40.0 and over, adult: Secondary | ICD-10-CM

## 2021-02-17 DIAGNOSIS — Z743 Need for continuous supervision: Secondary | ICD-10-CM | POA: Diagnosis not present

## 2021-02-17 DIAGNOSIS — R062 Wheezing: Secondary | ICD-10-CM | POA: Diagnosis not present

## 2021-02-17 DIAGNOSIS — M4856XA Collapsed vertebra, not elsewhere classified, lumbar region, initial encounter for fracture: Secondary | ICD-10-CM | POA: Diagnosis not present

## 2021-02-17 DIAGNOSIS — Z833 Family history of diabetes mellitus: Secondary | ICD-10-CM | POA: Diagnosis not present

## 2021-02-17 DIAGNOSIS — F209 Schizophrenia, unspecified: Secondary | ICD-10-CM | POA: Diagnosis not present

## 2021-02-17 DIAGNOSIS — M4319 Spondylolisthesis, multiple sites in spine: Secondary | ICD-10-CM | POA: Diagnosis not present

## 2021-02-17 DIAGNOSIS — I700 Atherosclerosis of aorta: Secondary | ICD-10-CM | POA: Diagnosis not present

## 2021-02-17 DIAGNOSIS — S299XXA Unspecified injury of thorax, initial encounter: Secondary | ICD-10-CM | POA: Diagnosis not present

## 2021-02-17 DIAGNOSIS — M419 Scoliosis, unspecified: Secondary | ICD-10-CM | POA: Diagnosis not present

## 2021-02-17 HISTORY — DX: Type 2 diabetes mellitus without complications: E11.9

## 2021-02-17 LAB — CBC
HCT: 39.8 % (ref 39.0–52.0)
Hemoglobin: 12.7 g/dL — ABNORMAL LOW (ref 13.0–17.0)
MCH: 28.8 pg (ref 26.0–34.0)
MCHC: 31.9 g/dL (ref 30.0–36.0)
MCV: 90.2 fL (ref 80.0–100.0)
Platelets: 281 10*3/uL (ref 150–400)
RBC: 4.41 MIL/uL (ref 4.22–5.81)
RDW: 14.6 % (ref 11.5–15.5)
WBC: 8.6 10*3/uL (ref 4.0–10.5)
nRBC: 0 % (ref 0.0–0.2)

## 2021-02-17 LAB — RESP PANEL BY RT-PCR (FLU A&B, COVID) ARPGX2
Influenza A by PCR: NEGATIVE
Influenza B by PCR: NEGATIVE
SARS Coronavirus 2 by RT PCR: NEGATIVE

## 2021-02-17 LAB — TROPONIN I (HIGH SENSITIVITY): Troponin I (High Sensitivity): 19 ng/L — ABNORMAL HIGH (ref <18)

## 2021-02-17 LAB — BASIC METABOLIC PANEL
Anion gap: 9 (ref 5–15)
BUN: 11 mg/dL (ref 8–23)
CO2: 30 mmol/L (ref 22–32)
Calcium: 9.4 mg/dL (ref 8.9–10.3)
Chloride: 103 mmol/L (ref 98–111)
Creatinine, Ser: 0.8 mg/dL (ref 0.61–1.24)
GFR, Estimated: >60 mL/min (ref >60)
Glucose, Bld: 169 mg/dL — ABNORMAL HIGH (ref 70–99)
Potassium: 4.2 mmol/L (ref 3.5–5.1)
Sodium: 142 mmol/L (ref 135–145)

## 2021-02-17 LAB — TSH: TSH: 1.479 u[IU]/mL (ref 0.350–4.500)

## 2021-02-17 LAB — BRAIN NATRIURETIC PEPTIDE: B Natriuretic Peptide: 66.9 pg/mL (ref 0.0–100.0)

## 2021-02-17 MED ORDER — LACTATED RINGERS IV BOLUS
1000.0000 mL | Freq: Once | INTRAVENOUS | Status: AC
  Administered 2021-02-17: 1000 mL via INTRAVENOUS

## 2021-02-17 MED ORDER — IOHEXOL 350 MG/ML SOLN
70.0000 mL | Freq: Once | INTRAVENOUS | Status: AC | PRN
  Administered 2021-02-17: 70 mL via INTRAVENOUS

## 2021-02-17 NOTE — ED Triage Notes (Signed)
Pt in via EMS from home with c/o weakness. Pt was not able to walk out to get in his truck he as so weak. 100/60, HR 65, 95% RA, CBG 144.

## 2021-02-17 NOTE — ED Notes (Signed)
Patient returns from ct

## 2021-02-17 NOTE — ED Notes (Signed)
Patient reports his gabapentin dose was upped from 300 for 500mg . Also reports getting prescribed narcotics and taking 1 pill today
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
## 2021-02-17 NOTE — ED Notes (Signed)
Pt resting in bed comfortably, NAD. No needs verbalized at this time. Bed low & locked. Call light & personal items within reach.

## 2021-02-17 NOTE — ED Notes (Signed)
Patient transported to CT 

## 2021-02-18 ENCOUNTER — Encounter: Payer: Self-pay | Admitting: Internal Medicine

## 2021-02-18 ENCOUNTER — Emergency Department: Payer: Medicare Other

## 2021-02-18 DIAGNOSIS — M47814 Spondylosis without myelopathy or radiculopathy, thoracic region: Secondary | ICD-10-CM | POA: Diagnosis not present

## 2021-02-18 DIAGNOSIS — M5126 Other intervertebral disc displacement, lumbar region: Secondary | ICD-10-CM | POA: Diagnosis not present

## 2021-02-18 DIAGNOSIS — E1169 Type 2 diabetes mellitus with other specified complication: Secondary | ICD-10-CM | POA: Diagnosis not present

## 2021-02-18 DIAGNOSIS — Z6841 Body Mass Index (BMI) 40.0 and over, adult: Secondary | ICD-10-CM | POA: Diagnosis not present

## 2021-02-18 DIAGNOSIS — E1142 Type 2 diabetes mellitus with diabetic polyneuropathy: Secondary | ICD-10-CM | POA: Diagnosis present

## 2021-02-18 DIAGNOSIS — R29898 Other symptoms and signs involving the musculoskeletal system: Secondary | ICD-10-CM | POA: Diagnosis not present

## 2021-02-18 DIAGNOSIS — M4319 Spondylolisthesis, multiple sites in spine: Secondary | ICD-10-CM | POA: Diagnosis not present

## 2021-02-18 DIAGNOSIS — I152 Hypertension secondary to endocrine disorders: Secondary | ICD-10-CM | POA: Diagnosis not present

## 2021-02-18 DIAGNOSIS — B1920 Unspecified viral hepatitis C without hepatic coma: Secondary | ICD-10-CM | POA: Diagnosis present

## 2021-02-18 DIAGNOSIS — M62511 Muscle wasting and atrophy, not elsewhere classified, right shoulder: Secondary | ICD-10-CM | POA: Diagnosis not present

## 2021-02-18 DIAGNOSIS — G319 Degenerative disease of nervous system, unspecified: Secondary | ICD-10-CM | POA: Diagnosis not present

## 2021-02-18 DIAGNOSIS — E785 Hyperlipidemia, unspecified: Secondary | ICD-10-CM | POA: Diagnosis present

## 2021-02-18 DIAGNOSIS — D440 Neoplasm of uncertain behavior of thyroid gland: Secondary | ICD-10-CM | POA: Diagnosis not present

## 2021-02-18 DIAGNOSIS — Z83438 Family history of other disorder of lipoprotein metabolism and other lipidemia: Secondary | ICD-10-CM | POA: Diagnosis not present

## 2021-02-18 DIAGNOSIS — Z20822 Contact with and (suspected) exposure to covid-19: Secondary | ICD-10-CM | POA: Diagnosis present

## 2021-02-18 DIAGNOSIS — R262 Difficulty in walking, not elsewhere classified: Secondary | ICD-10-CM | POA: Diagnosis present

## 2021-02-18 DIAGNOSIS — E1159 Type 2 diabetes mellitus with other circulatory complications: Secondary | ICD-10-CM

## 2021-02-18 DIAGNOSIS — Z89211 Acquired absence of right upper limb below elbow: Secondary | ICD-10-CM | POA: Diagnosis not present

## 2021-02-18 DIAGNOSIS — F209 Schizophrenia, unspecified: Secondary | ICD-10-CM

## 2021-02-18 DIAGNOSIS — Z8249 Family history of ischemic heart disease and other diseases of the circulatory system: Secondary | ICD-10-CM | POA: Diagnosis not present

## 2021-02-18 DIAGNOSIS — M419 Scoliosis, unspecified: Secondary | ICD-10-CM | POA: Diagnosis not present

## 2021-02-18 DIAGNOSIS — M48062 Spinal stenosis, lumbar region with neurogenic claudication: Secondary | ICD-10-CM | POA: Diagnosis not present

## 2021-02-18 DIAGNOSIS — Z9081 Acquired absence of spleen: Secondary | ICD-10-CM | POA: Diagnosis not present

## 2021-02-18 DIAGNOSIS — R531 Weakness: Secondary | ICD-10-CM | POA: Diagnosis not present

## 2021-02-18 DIAGNOSIS — M4856XA Collapsed vertebra, not elsewhere classified, lumbar region, initial encounter for fracture: Secondary | ICD-10-CM | POA: Diagnosis not present

## 2021-02-18 DIAGNOSIS — G8929 Other chronic pain: Secondary | ICD-10-CM | POA: Diagnosis present

## 2021-02-18 DIAGNOSIS — Z833 Family history of diabetes mellitus: Secondary | ICD-10-CM | POA: Diagnosis not present

## 2021-02-18 DIAGNOSIS — S299XXA Unspecified injury of thorax, initial encounter: Secondary | ICD-10-CM | POA: Diagnosis not present

## 2021-02-18 DIAGNOSIS — M48061 Spinal stenosis, lumbar region without neurogenic claudication: Secondary | ICD-10-CM

## 2021-02-18 DIAGNOSIS — M5136 Other intervertebral disc degeneration, lumbar region: Secondary | ICD-10-CM | POA: Diagnosis present

## 2021-02-18 DIAGNOSIS — Z7982 Long term (current) use of aspirin: Secondary | ICD-10-CM | POA: Diagnosis not present

## 2021-02-18 DIAGNOSIS — M4804 Spinal stenosis, thoracic region: Secondary | ICD-10-CM | POA: Diagnosis present

## 2021-02-18 DIAGNOSIS — Z79899 Other long term (current) drug therapy: Secondary | ICD-10-CM | POA: Diagnosis not present

## 2021-02-18 DIAGNOSIS — Z981 Arthrodesis status: Secondary | ICD-10-CM | POA: Diagnosis not present

## 2021-02-18 DIAGNOSIS — E041 Nontoxic single thyroid nodule: Secondary | ICD-10-CM | POA: Diagnosis not present

## 2021-02-18 DIAGNOSIS — Z87891 Personal history of nicotine dependence: Secondary | ICD-10-CM | POA: Diagnosis not present

## 2021-02-18 LAB — URINALYSIS, COMPLETE (UACMP) WITH MICROSCOPIC
Bacteria, UA: NONE SEEN
Bilirubin Urine: NEGATIVE
Glucose, UA: NEGATIVE mg/dL
Hgb urine dipstick: NEGATIVE
Ketones, ur: NEGATIVE mg/dL
Leukocytes,Ua: NEGATIVE
Nitrite: NEGATIVE
Protein, ur: NEGATIVE mg/dL
Specific Gravity, Urine: 1.023 (ref 1.005–1.030)
Squamous Epithelial / HPF: NONE SEEN (ref 0–5)
pH: 8 (ref 5.0–8.0)

## 2021-02-18 LAB — GLUCOSE, CAPILLARY
Glucose-Capillary: 108 mg/dL — ABNORMAL HIGH (ref 70–99)
Glucose-Capillary: 152 mg/dL — ABNORMAL HIGH (ref 70–99)
Glucose-Capillary: 120 mg/dL — ABNORMAL HIGH (ref 70–99)
Glucose-Capillary: 137 mg/dL — ABNORMAL HIGH (ref 70–99)

## 2021-02-18 MED ORDER — ATENOLOL 25 MG PO TABS
25.0000 mg | ORAL_TABLET | Freq: Every day | ORAL | Status: DC
  Administered 2021-02-18: 25 mg via ORAL
  Filled 2021-02-18 (×2): qty 1

## 2021-02-18 MED ORDER — BACLOFEN 10 MG PO TABS: 5.0000 mg | ORAL_TABLET | Freq: Two times a day (BID) | ORAL | Status: DC | PRN

## 2021-02-18 MED ORDER — ACETAMINOPHEN 650 MG RE SUPP: 650.0000 mg | Freq: Four times a day (QID) | RECTAL | Status: DC | PRN

## 2021-02-18 MED ORDER — ASPIRIN 81 MG PO CHEW
81.0000 mg | CHEWABLE_TABLET | Freq: Every day | ORAL | Status: DC
  Administered 2021-02-18: 81 mg via ORAL
  Filled 2021-02-18 (×2): qty 1

## 2021-02-18 MED ORDER — GABAPENTIN 400 MG PO CAPS
400.0000 mg | ORAL_CAPSULE | Freq: Three times a day (TID) | ORAL | Status: DC
  Administered 2021-02-18 – 2021-02-20 (×7): 400 mg via ORAL
  Filled 2021-02-18 (×7): qty 1

## 2021-02-18 MED ORDER — SODIUM CHLORIDE 0.9% FLUSH
3.0000 mL | Freq: Two times a day (BID) | INTRAVENOUS | Status: DC
  Administered 2021-02-18 – 2021-02-19 (×2): 3 mL via INTRAVENOUS

## 2021-02-18 MED ORDER — OLANZAPINE 10 MG PO TABS
20.0000 mg | ORAL_TABLET | Freq: Every day | ORAL | Status: DC
  Administered 2021-02-18 – 2021-02-19 (×2): 20 mg via ORAL
  Filled 2021-02-18 (×2): qty 4
  Filled 2021-02-18 (×3): qty 2

## 2021-02-18 MED ORDER — MELOXICAM 7.5 MG PO TABS
15.0000 mg | ORAL_TABLET | Freq: Every day | ORAL | Status: DC
  Administered 2021-02-18 – 2021-02-19 (×2): 15 mg via ORAL
  Filled 2021-02-18 (×3): qty 2

## 2021-02-18 MED ORDER — ENOXAPARIN SODIUM 60 MG/0.6ML IJ SOSY
0.5000 mg/kg | PREFILLED_SYRINGE | INTRAMUSCULAR | Status: DC
  Administered 2021-02-18 – 2021-02-20 (×3): 52.5 mg via SUBCUTANEOUS
  Filled 2021-02-18 (×3): qty 0.6

## 2021-02-18 MED ORDER — KETOROLAC TROMETHAMINE 30 MG/ML IJ SOLN: 30.0000 mg | Freq: Four times a day (QID) | INTRAMUSCULAR | Status: DC | PRN

## 2021-02-18 MED ORDER — GABAPENTIN 300 MG PO CAPS: 300.0000 mg | ORAL_CAPSULE | Freq: Three times a day (TID) | ORAL | Status: DC

## 2021-02-18 MED ORDER — ACETAMINOPHEN 325 MG PO TABS: 650.0000 mg | ORAL_TABLET | Freq: Four times a day (QID) | ORAL | Status: DC | PRN

## 2021-02-18 MED ORDER — ROSUVASTATIN CALCIUM 5 MG PO TABS
5.0000 mg | ORAL_TABLET | Freq: Every day | ORAL | Status: DC
  Administered 2021-02-18 – 2021-02-19 (×2): 5 mg via ORAL
  Filled 2021-02-18 (×2): qty 1

## 2021-02-18 MED ORDER — METFORMIN HCL 500 MG PO TABS: 500.0000 mg | ORAL_TABLET | Freq: Two times a day (BID) | ORAL | Status: DC

## 2021-02-18 MED ORDER — SODIUM CHLORIDE 0.9 % IV SOLN: 250.0000 mL | INTRAVENOUS | Status: DC | PRN

## 2021-02-18 MED ORDER — SODIUM CHLORIDE 0.9% FLUSH: 3.0000 mL | INTRAVENOUS | Status: DC | PRN

## 2021-02-18 MED ORDER — INSULIN ASPART 100 UNIT/ML IJ SOLN
0.0000 [IU] | Freq: Three times a day (TID) | INTRAMUSCULAR | Status: DC
  Filled 2021-02-18: qty 1

## 2021-02-18 NOTE — ED Notes (Signed)
Pt given phone to call family member. Pt given ice chips, informed pt as soon as he can have food I would give him something to eat.

## 2021-02-18 NOTE — ED Provider Notes (Signed)
1:50 AM Assumed care for off going team.   Blood pressure 139/77, pulse (!) 53, temperature 98.2 F (36.8 C), temperature source Oral, resp. rate 17, height 5\' 4"  (1.626 m), weight 106.6 kg, SpO2 96 %.  See their HPI for full report but in brief patient pending UA  Patient unable to give UA.  We did a bladder scan with greater than 1000 therefore will place Foley.  We will proceed with MRI spine to evaluate for possible causes of patient not been able to walk.  We will discussed with the hospital team for admission       Vanessa [place], MD 02/18/21 [phone]
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
## 2021-02-18 NOTE — ED Notes (Signed)
Transport came to take pt to MRI. Pt refused to go to MRI, stating he won't go until he has been given something to eat. Explained to pt that we cannot give him anything to eat until we have the MRI results in case there is something emergent that needs treatment on the MRI. Pt verbalizes understanding, agrees to go to MRI.
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
## 2021-02-18 NOTE — ED Notes (Signed)
Pt bedding changed

## 2021-02-18 NOTE — ED Notes (Signed)
Pt refusing foley catheter.  MD aware.
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
## 2021-02-19 ENCOUNTER — Inpatient Hospital Stay: Payer: Medicare Other

## 2021-02-19 ENCOUNTER — Encounter: Payer: Self-pay | Admitting: Internal Medicine

## 2021-02-19 LAB — BASIC METABOLIC PANEL
Anion gap: 6 (ref 5–15)
BUN: 12 mg/dL (ref 8–23)
CO2: 29 mmol/L (ref 22–32)
Calcium: 8.9 mg/dL (ref 8.9–10.3)
Chloride: 105 mmol/L (ref 98–111)
Creatinine, Ser: 0.73 mg/dL (ref 0.61–1.24)
GFR, Estimated: >60 mL/min (ref >60)
Glucose, Bld: 120 mg/dL — ABNORMAL HIGH (ref 70–99)
Potassium: 4 mmol/L (ref 3.5–5.1)
Sodium: 140 mmol/L (ref 135–145)

## 2021-02-19 LAB — GLUCOSE, CAPILLARY
Glucose-Capillary: 131 mg/dL — ABNORMAL HIGH (ref 70–99)
Glucose-Capillary: 143 mg/dL — ABNORMAL HIGH (ref 70–99)
Glucose-Capillary: 104 mg/dL — ABNORMAL HIGH (ref 70–99)
Glucose-Capillary: 156 mg/dL — ABNORMAL HIGH (ref 70–99)

## 2021-02-19 LAB — TSH: TSH: 2.257 u[IU]/mL (ref 0.350–4.500)

## 2021-02-19 LAB — HIV ANTIBODY (ROUTINE TESTING W REFLEX): HIV Screen 4th Generation wRfx: NONREACTIVE

## 2021-02-19 MED ORDER — HALOPERIDOL LACTATE 5 MG/ML IJ SOLN
2.5000 mg | Freq: Four times a day (QID) | INTRAMUSCULAR | Status: DC | PRN
Start: 2021-02-19 — End: 2021-02-20

## 2021-02-19 NOTE — TOC Initial Note (Signed)
Transition of Care [facility]) - Initial/Assessment Note    Patient Details  Name: Ryan [name] MRN: [id number] Date of Birth: 1951/04/29  Transition of Care [facility]) CM/SW Contact:    Pete Pelt, RN Phone Number: 02/19/2021, 1:41 PM  Clinical Narrative:   Patient lives alone has help from Dr. Gabriel Carina and maintenance man for meals, transportation and prescriptions.    SNF recommendations from therapy.  Patient states he refuses SNF and continues to refuse SNF.  He states he will accept home health for PT/OT.                  Expected Discharge Plan: [place] Barriers to Discharge: Continued Medical Work up   Patient Goals and CMS Choice Patient states their goals for this hospitalization and ongoing recovery are:: To go home and keep living by myself, the way I want to   Choice offered to / list presented to : NA  Expected Discharge Plan and Services Expected Discharge Plan: [place]   Discharge Planning Services: CM Consult Post Acute Care Choice: Durable Medical Equipment, Home Health Living arrangements for the past 2 months: Single Family Home                                      Prior Living Arrangements/Services Living arrangements for the past 2 months: Single Family Home Lives with:: Self Patient language and need for interpreter reviewed:: Yes (patient does not require interpreter)        Need for Family Participation in Patient Care: Yes (Comment) Care giver support system in place?: Yes (comment) Current home services: Other (comment) (Patient has nurses from Dr. office and accepts home health) Criminal Activity/Legal Involvement Pertinent to Current Situation/Hospitalization: No - Comment as needed  Activities of Daily Living Home Assistive Devices/Equipment: Eyeglasses, Cane (specify quad or straight), Walker (specify type) ADL Screening (condition at time of admission) Patient's cognitive ability adequate to safely  complete daily activities?: Yes Is the patient deaf or have difficulty hearing?: No Does the patient have difficulty seeing, even when wearing glasses/contacts?: No Does the patient have difficulty concentrating, remembering, or making decisions?: No Patient able to express need for assistance with ADLs?: Yes Does the patient have difficulty dressing or bathing?: No Independently performs ADLs?: Yes (appropriate for developmental age) Does the patient have difficulty walking or climbing stairs?: Yes Weakness of Legs: Both Weakness of Arms/Hands: Right (amputation)  Permission Sought/Granted   Permission granted to share information with : Yes, Verbal Permission Granted     Permission granted to share info w AGENCY: Home health agencies        Emotional Assessment Appearance:: Appears stated age Attitude/Demeanor/Rapport: Engaged Affect (typically observed): Pleasant, Appropriate Orientation: : Oriented to Self, Oriented to Place, Oriented to  Time, Oriented to Situation Alcohol / Substance Use: Not Applicable Psych Involvement: No (comment)  Admission diagnosis:  Generalized weakness [R53.1] Lower extremity weakness [R29.898] Patient Active Problem List   Diagnosis Date Noted   Lower extremity weakness 02/18/2021   Aortic atherosclerosis ([place]) 10/07/2020   Thoracic aortic ectasia ([place]) 10/07/2020   Type 2 diabetes mellitus with morbid obesity ([place]) 01/10/2020   Morbid obesity ([place]) 01/10/2020   Hyperlipidemia associated with type 2 diabetes mellitus ([place]) 04/20/2018   Spinal stenosis 11/24/2017   Frequent falls 03/18/2017   Advanced care planning/counseling discussion 09/06/2016   History of amputation of arm  above elbow, right 54/65/0354   Self inflicted enucleation of right eye 06/28/2015   Hepatitis C 06/26/2015   Chronic pain 01/25/2015   Hypertension associated with diabetes ([place]) 01/25/2015   Peripheral neuropathy ([place]) 01/25/2015   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 [place] [address] Phone: [phone] Fax: [phone]     Social Determinants of Health (SDOH) Interventions    Readmission Risk Interventions No flowsheet data found.

## 2021-02-19 NOTE — Progress Notes (Addendum)
Progress Note    Ryan [name]  MVE:[id number] DOB: 03/23/1951  DOA: 02/17/2021 PCP: [name], NP      Brief Narrative:    Medical records reviewed and are as summarized below:  Ryan [name] is a 70 y.o. male with medical history significant for schizophrenia, hepatitis C, hypertension, type 2 diabetes mellitus, peripheral neuropathy, hyperlipidemia, chronic back pain.  He presented to the hospital because of significant weakness in bilateral lower extremities and inability to ambulate.  He was found to have lumbar spine degenerative disc disease and lumbar stenosis.  He was evaluated by the neurosurgeon, Dr. Lacinda Axon.  According to Dr. Lacinda Axon, there is no obvious severe compression of the spinal cord and no surgical intervention was recommended.  He was evaluated by PT and OT recommended further rehabilitation at a skilled nursing facility.  Work-up also revealed left thyroid nodule.  Assessment/Plan:   Active Problems:   Hypertension associated with diabetes ([place])   Peripheral neuropathy ([facility])   Hepatitis C   Schizophrenia ([place])   Spinal stenosis   Hyperlipidemia associated with type 2 diabetes mellitus ([place])   Type 2 diabetes mellitus with morbid obesity ([facility])   Lower extremity weakness     Body mass index is 40.34 kg/m.  (Morbid obesity)  Lower extremity weakness, lumbar spinal stenosis: He has been evaluated by the neurosurgeon.  No surgical intervention required at this time.  Outpatient follow-up with neurosurgeon recommended.  PT and OT recommended discharge to SNF.  However, patient prefers to go home with home health therapy.  He lives alone and it may be unsafe to go home at this time.  He understands that he is at increased risk for falls and injury.  Left thyroid nodule (2.6 cm): Ultrasound-guided fine-needle aspiration of thyroid nodule has been ordered.  Patient prefers to have this test as an inpatient order and read as an outpatient.  Check  TSH.  Schizophrenia: Continue olanzapine  Other comorbidities include hypertension, hyperlipidemia, type II DM   Diet Order             Diet Carb Modified Fluid consistency: Thin; Room service appropriate? Yes  Diet effective now                      Consultants: Neurosurgeon  Procedures: None    Medications:    aspirin  81 mg Oral Daily   atenolol  25 mg Oral Daily   enoxaparin (LOVENOX) injection  0.5 mg/kg Subcutaneous Q24H   gabapentin  400 mg Oral TID   insulin aspart  0-20 Units Subcutaneous TID WC   meloxicam  15 mg Oral Daily   [START ON 02/20/2021] metFORMIN  500 mg Oral BID WC   OLANZapine  20 mg Oral QHS   rosuvastatin  5 mg Oral Daily   sodium chloride flush  3 mL Intravenous Q12H   Continuous Infusions:  sodium chloride       Anti-infectives (From admission, onward)    None              Family Communication/Anticipated D/C date and plan/Code Status   DVT prophylaxis:      Code Status: Full Code  Family Communication: None Disposition Plan:    Status is: Inpatient  Remains inpatient appropriate because:Unsafe d/c plan and Inpatient level of care appropriate due to severity of illness  Dispo: The patient is from: Home  Anticipated d/c is to: Home              Patient currently is not medically stable to d/c.   Difficult to place patient No           Subjective:   Interval events noted.  No new complaints.  Objective:    Vitals:   02/19/21 0638 02/19/21 0839 02/19/21 1136 02/19/21 1141  BP: (!) 116/92   (!) 151/73  Pulse: 60 (!) 58  61  Resp: 20   20  Temp: 98 F (36.7 C)   97.6 F (36.4 C)  TempSrc: Oral  Oral Oral  SpO2: 91%   93%  Weight:      Height:       No data found.   Intake/Output Summary (Last 24 hours) at 02/19/2021 1627 Last data filed at 02/19/2021 1530 Gross per 24 hour  Intake --  Output 3800 ml  Net -3800 ml   Filed Weights   02/17/21 1406  Weight: 106.6 kg     Exam:  GEN: NAD SKIN: No rash EYES: EOMI ENT: MMM, edentulous, slurred speech likely from lack of teeth CV: RRR PULM: CTA B ABD: soft, ND, NT, +BS CNS: AAO x 3, non focal EXT: No edema or tenderness        Data Reviewed:   I have personally reviewed following labs and imaging studies:  Labs: Labs show the following:   Basic Metabolic Panel: Recent Labs  Lab 02/17/21 1407 02/19/21 0800  NA 142 140  K 4.2 4.0  CL 103 105  CO2 30 29  GLUCOSE 169* 120*  BUN 11 12  CREATININE 0.80 0.73  CALCIUM 9.4 8.9   GFR Estimated Creatinine Clearance: 97.8 mL/min (by C-G formula based on SCr of 0.73 mg/dL). Liver Function Tests: No results for input(s): AST, ALT, ALKPHOS, BILITOT, PROT, ALBUMIN in the last 168 hours. No results for input(s): LIPASE, AMYLASE in the last 168 hours. No results for input(s): AMMONIA in the last 168 hours. Coagulation profile No results for input(s): INR, PROTIME in the last 168 hours.  CBC: Recent Labs  Lab 02/17/21 1407  WBC 8.6  HGB 12.7*  HCT 39.8  MCV 90.2  PLT 281   Cardiac Enzymes: No results for input(s): CKTOTAL, CKMB, CKMBINDEX, TROPONINI in the last 168 hours. BNP (last 3 results) No results for input(s): PROBNP in the last 8760 hours. CBG: Recent Labs  Lab 02/18/21 1142 02/18/21 1702 02/18/21 2212 02/19/21 0744 02/19/21 1135  GLUCAP 152* 120* 137* 131* 143*   D-Dimer: No results for input(s): DDIMER in the last 72 hours. Hgb A1c: No results for input(s): HGBA1C in the last 72 hours. Lipid Profile: No results for input(s): CHOL, HDL, LDLCALC, TRIG, CHOLHDL, LDLDIRECT in the last 72 hours. Thyroid function studies: Recent Labs    02/17/21 1407  TSH 1.479   Anemia work up: No results for input(s): VITAMINB12, FOLATE, FERRITIN, TIBC, IRON, RETICCTPCT in the last 72 hours. Sepsis Labs: Recent Labs  Lab 02/17/21 1407  WBC 8.6    Microbiology Recent Results (from the past 240 hour(s))  Resp Panel by  RT-PCR (Flu A&B, Covid) Nasopharyngeal Swab     Status: None   Collection Time: 02/17/21  4:52 PM   Specimen: Nasopharyngeal Swab; Nasopharyngeal(NP) swabs in vial transport medium  Result Value Ref Range Status   SARS Coronavirus 2 by RT PCR NEGATIVE NEGATIVE Final    Comment: (NOTE) SARS-CoV-2 target nucleic acids are NOT DETECTED.  The SARS-CoV-2 RNA  is generally detectable in upper respiratory specimens during the acute phase of infection. The lowest concentration of SARS-CoV-2 viral copies this assay can detect is 138 copies/mL. A negative result does not preclude SARS-Cov-2 infection and should not be used as the sole basis for treatment or other patient management decisions. A negative result may occur with  improper specimen collection/handling, submission of specimen other than nasopharyngeal swab, presence of viral mutation(s) within the areas targeted by this assay, and inadequate number of viral copies(<138 copies/mL). A negative result must be combined with clinical observations, patient history, and epidemiological information. The expected result is Negative.  Fact Sheet for Patients:  EntrepreneurPulse.com.au  Fact Sheet for Healthcare Providers:  IncredibleEmployment.be  This test is no t yet approved or cleared by the [place] FDA and  has been authorized for detection and/or diagnosis of SARS-CoV-2 by FDA under an Emergency Use Authorization (EUA). This EUA will remain  in effect (meaning this test can be used) for the duration of the COVID-19 declaration under Section 564(b)(1) of the Act, 21 U.S.C.section 360bbb-3(b)(1), unless the authorization is terminated  or revoked sooner.       Influenza A by PCR NEGATIVE NEGATIVE Final   Influenza B by PCR NEGATIVE NEGATIVE Final    Comment: (NOTE) The Xpert Xpress SARS-CoV-2/FLU/RSV plus assay is intended as an aid in the diagnosis of influenza from Nasopharyngeal swab  specimens and should not be used as a sole basis for treatment. Nasal washings and aspirates are unacceptable for Xpert Xpress SARS-CoV-2/FLU/RSV testing.  Fact Sheet for Patients: EntrepreneurPulse.com.au  Fact Sheet for Healthcare Providers: IncredibleEmployment.be  This test is not yet approved or cleared by the [place] FDA and has been authorized for detection and/or diagnosis of SARS-CoV-2 by FDA under an Emergency Use Authorization (EUA). This EUA will remain in effect (meaning this test can be used) for the duration of the COVID-19 declaration under Section 564(b)(1) of the Act, 21 U.S.C. section 360bbb-3(b)(1), unless the authorization is terminated or revoked.  Performed at [facility], [address], [place], [place] [address]     Procedures and diagnostic studies:  CT Head Wo Contrast  Result Date: 02/17/2021 CLINICAL DATA:  Weakness. EXAM: CT HEAD WITHOUT CONTRAST TECHNIQUE: Contiguous axial images were obtained from the base of the skull through the vertex without intravenous contrast. COMPARISON:  10/25/2011 FINDINGS: Brain: There is central cortical atrophy. Significant periventricular white matter changes are consistent with small vessel disease. There is no intra or extra-axial fluid collection or mass lesion. The basilar cisterns and ventricles have a normal appearance. There is no CT evidence for acute infarction or hemorrhage. Vascular: Significant atherosclerotic calcification of the internal carotid arteries. No hyperdense vessels. Skull: Normal. Negative for fracture or focal lesion. Sinuses/Orbits: No acute finding. Other: None. IMPRESSION: 1. Atrophy and small vessel disease. 2.  No evidence for acute intracranial abnormality. Electronically Signed   By: Nolon Nations M.D.   On: 02/17/2021 16:59   CT Chest W Contrast  Result Date: 02/17/2021 CLINICAL DATA:  Possible mediastinal mass on recent chest x-ray  EXAM: CT CHEST WITH CONTRAST TECHNIQUE: Multidetector CT imaging of the chest was performed during intravenous contrast administration. CONTRAST:  84mL OMNIPAQUE IOHEXOL 350 MG/ML SOLN COMPARISON:  Chest x-ray from earlier in the same day. FINDINGS: Cardiovascular: Thoracic aorta demonstrates atherosclerotic calcification as well as tortuosity. Mild aneurysmal dilatation of the ascending segment to 4.4 cm is noted. No evidence of dissection is seen. Heart is not significantly enlarged. Coronary calcifications are  seen. The pulmonary artery as visualized shows no large central pulmonary embolus although not timed for embolus evaluation. Mediastinum/Nodes: Thoracic inlet demonstrates a 2 cm hypodense nodule within the left lobe of the thyroid. Mediastinum demonstrates prominent lipomatosis which accounts for the majority of the density seen on the rotated chest x-ray. A few scattered small lymph nodes with normal fatty hila are noted. The esophagus as visualized is within normal limits. Lungs/Pleura: The lungs are well aerated bilaterally. No focal infiltrate or effusion is noted. Minimal right basilar atelectasis is seen. Upper Abdomen: Diffuse density is noted throughout the gallbladder consistent with cholelithiasis. No wall thickening is noted. Fatty infiltration of the liver is seen. The remainder of the upper abdomen is unremarkable. Musculoskeletal: Degenerative changes of the thoracic spine are noted. Old healed rib fractures are noted on the right. No acute rib fracture is noted. Mild scoliosis of the thoracic spine also accentuates the mediastinal prominence on recent chest x-ray. IMPRESSION: Dilatation of the ascending aorta to 4.4 cm with tortuosity of the thoracic aorta. Recommend annual imaging followup by CTA or MRA. This recommendation follows 2010 ACCF/AHA/AATS/ACR/ASA/SCA/SCAI/SIR/STS/SVM Guidelines for the Diagnosis and Management of Patients with Thoracic Aortic Disease. Circulation. 2010; 121:  F643-P295. Aortic aneurysm NOS (ICD10-I71.9) 2 cm incidental left thyroid nodule. Recommend thyroid US. Reference: J Am Coll Radiol. 2015 Feb;12(2): 143-50 Mild mediastinal lipomatosis which also accentuates the mediastinal markings on recent chest x-ray. Cholelithiasis and fatty infiltration of the liver. Aortic Atherosclerosis (ICD10-I70.0). Electronically Signed   By: Inez Catalina M.D.   On: 02/17/2021 19:08   MR BRAIN WO CONTRAST  Result Date: 02/18/2021 CLINICAL DATA:  Neuro deficit with acute stroke suspected.  Weakness EXAM: MRI HEAD WITHOUT CONTRAST TECHNIQUE: Multiplanar, multiecho pulse sequences of the brain and surrounding structures were obtained without intravenous contrast. COMPARISON:  Head CT from yesterday FINDINGS: Brain: No acute infarction, hemorrhage, hydrocephalus, extra-axial collection or mass lesion. Brain atrophy greater than expected for age. T2 hyperintensity in the cerebral white matter presumably from chronic small vessel ischemia given medical history. Notable right inferior frontal encephalomalacia could be posttraumatic or post ischemic. Vascular: Grossly preserved flow voids. Skull and upper cervical spine: No gross marrow signal abnormality Sinuses/Orbits: Right enucleation with prosthesis. Other: Very motion degraded, findings could easily be obscured. IMPRESSION: 1. Significantly motion degraded.  No acute finding. 2. Premature cerebral volume loss and white matter disease. Electronically Signed   By: Jorje Guild M.D.   On: 02/18/2021 05:06   MR THORACIC SPINE WO CONTRAST  Result Date: 02/18/2021 CLINICAL DATA:  Back trauma with abnormal neuro exam. CT or positive x-ray. EXAM: MRI THORACIC SPINE WITHOUT CONTRAST TECHNIQUE: Multiplanar, multisequence MR imaging of the thoracic spine was performed. No intravenous contrast was administered. COMPARISON:  Chest CT from yesterday.  Thoracic MRI from 03/31/2017 FINDINGS: Alignment:  Mild scoliosis.  No traumatic  malalignment. Vertebrae: Intervertebral ankylosis at T9-10. No fracture, discitis, or aggressive bone lesion. T5 hemangioma Cord:  Normal signal and morphology. Paraspinal and other soft tissues: No evidence of perispinal mass or inflammation. There is a longstanding ventral canal CSF intensity collection essentially involving the entire thoracic spine. Chronicity is inconsistent with infectious collection, hemorrhage, or active CSF leak. Severe muscular atrophy at the level of the right scapula. Disc levels: Generalized degenerative disc narrowing and bulging with endplate spurring greatest at the lower thoracic levels. Facet osteoarthritis also greatest at the lower thoracic levels. No degenerative cord impingement. There is mild spinal stenosis at T10-11 and T11-12. At the same levels disc  bulging and facet spurring cause foraminal impingement. IMPRESSION: 1. No emergent finding.  Normal cord signal. 2. Ventral epidural collection which in retrospect is stable from 2018. No associated mass effect. Spinal degeneration especially at lower thoracic levels where there is foraminal impingement and noncompressive spinal stenosis. Electronically Signed   By: Jorje Guild M.D.   On: 02/18/2021 05:04   MR LUMBAR SPINE WO CONTRAST  Result Date: 02/18/2021 CLINICAL DATA:  Low back pain with cauda equina suspected EXAM: MRI LUMBAR SPINE WITHOUT CONTRAST TECHNIQUE: Multiplanar, multisequence MR imaging of the lumbar spine was performed. No intravenous contrast was administered. COMPARISON:  03/31/2017 FINDINGS: Segmentation:  5 lumbar type vertebrae Alignment:  Grade 1 anterolisthesis at L5-S1. Vertebrae: No acute fracture, evidence of discitis, or bone lesion. Remote L1 compression fracture. Conus medullaris and cauda equina: Conus extends to the L2 level. Conus and cauda equina appear normal. Paraspinal and other soft tissues: No evidence of perispinal mass or inflammation. Trabeculated bladder. Disc levels: T12- L1:  Disc bulging and mild facet spurring. Mild to moderate thecal sac narrowing primarily from short pedicles L1-L2: Disc narrowing and bulging with endplate spurring. Cystic appearing structure on sagittal images which is left eccentric and could be a synovial cyst or a recent herniation. Conus impingement. Biforaminal compressive stenosis. L2-L3: Rightward disc bulging and mild facet spurring. L3-L4: Mild disc bulging and facet spurring L4-L5: Disc narrowing and bulging. Degenerative facet spurring asymmetric to the left. Moderate left foraminal impingement. Patent spinal canal L5-S1:Anterolisthesis from chronic L5 pars defects. The disc is bulging with biforaminal impingement flattening the L5 nerve roots. Patent spinal canal. IMPRESSION: 1. L1-2 conus impingement due to short pedicles, degenerative disease, and a cystic structure which could be discal or synovial cyst. Biforaminal impingement at the same level. 2. 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 Electronically Signed   By: Jorje Guild M.D.   On: 02/18/2021 04:58   DG Chest Portable 1 View  Result Date: 02/17/2021 CLINICAL DATA:  Wheezing, weakness EXAM: PORTABLE CHEST 1 VIEW COMPARISON:  Chest radiograph dated 03/31/2017. FINDINGS: The patient is rotated which limits evaluation of the cardiac and mediastinal contours. Given this limitation, there is suggestion of an anterior mediastinal mass/density. Mild-to-moderate bilateral interstitial opacities appear greater on the left. There is no pleural effusion or pneumothorax. Degenerative changes are seen in the spine. IMPRESSION: 1. Limited exam due to patient rotation, however there is suggestion of an anterior mediastinal mass/density. CT chest with contrast could be considered for further evaluation. 2. Bilateral interstitial opacities appear greater on the left and may represent pulmonary edema or pneumonia. Electronically Signed   By: Zerita Boers M.D.   On:  02/17/2021 17:38   US THYROID  Result Date: 02/19/2021 CLINICAL DATA:  Nodule seen on CT EXAM: THYROID ULTRASOUND TECHNIQUE: Ultrasound examination of the thyroid gland and adjacent soft tissues was performed. COMPARISON:  None. FINDINGS: Parenchymal Echotexture: Mildly heterogeneous Isthmus: 0.3 cm Right lobe: 4.5 x 1.7 x 2.0 cm Left lobe: 4.3 x 2.8 x 1.7 cm _________________________________________________________ Estimated total number of nodules >/= 1 cm: 1 Number of spongiform nodules >/=  2 cm not described below (TR1): 0 Number of mixed cystic and solid nodules >/= 1.5 cm not described below (TR2): 0 5 mm calcification in the right superior thyroid does not meet criteria for FNA or imaging surveillance. _________________________________________________________ Nodule # 1: Location: Left; superior Maximum size: 2.6 cm; Other 2 dimensions: 2.6 x 1.9 cm Composition: solid/almost completely solid (2) Echogenicity: isoechoic (1)  Shape: not taller-than-wide (0) Margins: smooth (0) Echogenic foci: none (0) ACR TI-RADS total points: 3. ACR TI-RADS risk category: TR3 (3 points). ACR TI-RADS recommendations: **Given size (>/= 2.5 cm) and appearance, fine needle aspiration of this mildly suspicious nodule should be considered based on TI-RADS criteria. _________________________________________________________ IMPRESSION: Nodule 1 (TI-RADS 3) located in the superior left thyroid lobe meets criteria for FNA. The above is in keeping with the ACR TI-RADS recommendations - J Am Coll Radiol 2017;14:587-595. Electronically Signed   By: Miachel Roux M.D.   On: 02/19/2021 12:05               LOS: 1 day   Derec Mozingo  [facility]   Pager on [email or web address]. If 7PM-7AM, please contact night-coverage at [email or web address]     02/19/2021, 4:27 PM

## 2021-02-19 NOTE — Progress Notes (Signed)
Pt refused Lovenox, Asprin and US Thyroid. Pt educated.  MD made aware.

## 2021-02-19 NOTE — Progress Notes (Signed)
PT Cancellation Note  Patient Details Name: Ryan [name] MRN: [id number] DOB: 03-09-1951   Cancelled Treatment:    Reason Eval/Treat Not Completed: Patient at procedure or test/unavailable  Pt off unit for testing.  Given late time of day will continue tomorrow.   Chesley Noon 02/19/2021, 3:47 PM

## 2021-02-20 ENCOUNTER — Inpatient Hospital Stay: Payer: Medicare Other

## 2021-02-20 LAB — GLUCOSE, CAPILLARY
Glucose-Capillary: 89 mg/dL (ref 70–99)
Glucose-Capillary: 94 mg/dL (ref 70–99)
Glucose-Capillary: 133 mg/dL — ABNORMAL HIGH (ref 70–99)
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
## 2021-02-20 NOTE — Procedures (Signed)
Successful US guided FNA of left superior thyroid nodule No complications. See PACS for full report.  Tsosie Billing D, PA-C 02/20/2021, 2:14 PM

## 2021-02-20 NOTE — TOC Progression Note (Signed)
Transition of Care [facility]) - Progression Note    Patient Details  Name: Tsugio Elison MRN: [id number] Date of Birth: [birth year]  Transition of Care [facility]) CM/SW [place], RN Phone Number: 02/20/2021, 9:58 AM  Clinical Narrative:   [facility] accepts patient.    Expected Discharge Plan: [place] Barriers to Discharge: Continued Medical Work up  Expected Discharge Plan and Services Expected Discharge Plan: [place]   Discharge Planning Services: CM Consult Post Acute Care Choice: Durable Medical Equipment, Home Health Living arrangements for the past 2 months: Single Family Home                                       Social Determinants of Health (SDOH) Interventions    Readmission Risk Interventions No flowsheet data found.

## 2021-02-20 NOTE — Discharge Summary (Signed)
Physician Discharge Summary  Jakell Trusty GYB:[id number] DOB: Feb 14, 1951 DOA: 02/17/2021  PCP: [name], NP  Admit date: 02/17/2021 Discharge date: 02/20/2021  Discharge disposition: Home with home health therapy   Recommendations for Outpatient Follow-Up:   Follow-up with PCP in 1 week. Follow-up with Dr. Cathleen Fears, neurosurgeon, in 2 weeks  Discharge Diagnosis:   Active Problems:   Hypertension associated with diabetes ([place])   Peripheral neuropathy ([place])   Hepatitis C   Schizophrenia ([place])   Spinal stenosis   Hyperlipidemia associated with type 2 diabetes mellitus ([place])   Type 2 diabetes mellitus with morbid obesity ([place])   Lower extremity weakness    Discharge Condition: Stable.  Diet recommendation:  Diet Order             Diet Carb Modified Fluid consistency: Thin; Room service appropriate? Yes  Diet effective now           Diet - low sodium heart healthy           Diet Carb Modified                     Code Status: Full Code     Hospital Course:   Mr. Cliford Sequeira is a 70 y.o. male with medical history significant for schizophrenia, hepatitis C, hypertension, type 2 diabetes mellitus, peripheral neuropathy, hyperlipidemia, chronic back pain.  He presented to the hospital because of significant weakness in bilateral lower extremities and inability to ambulate.   He was found to have lumbar spine degenerative disc disease and lumbar stenosis.  He was evaluated by the neurosurgeon, Dr. Lacinda Axon.  According to Dr. Lacinda Axon, there is no obvious severe compression of the spinal cord and no surgical intervention was recommended.  He was evaluated by PT and OT who recommended further rehabilitation at a skilled nursing facility.  However, patient vehemently refused to go to SNF.  He prefers to go home with home health therapy.  He understands that he is at increased risk for falls.  He said he has friends who help him at home.   Work-up also revealed  left thyroid nodule.  He underwent fine-needle aspiration of the left thyroid nodule.  Pathology report is pending at the time of discharge.    Medical Consultants:   Neurosurgeon, Dr. Cathleen Fears   Discharge Exam:    Vitals:   02/20/21 1121 02/20/21 1348 02/20/21 1413 02/20/21 1448  BP: (!) 158/72 136/67 139/70 (!) 108/95  Pulse: (!) 58  (!) 55 (!) 57  Resp: 18   17  Temp: (!) 97.5 F (36.4 C)   (!) 97.5 F (36.4 C)  TempSrc:      SpO2: 95% 92% 92% 94%  Weight:      Height:         GEN: NAD SKIN: Warm and dry EYES: No pallor or icterus ENT: MMM CV: RRR PULM: CTA B ABD: soft, ND, NT, +BS CNS: AAO x 3, non focal, slurred speech but this is because he is edentulous EXT: No edema or tenderness   The results of significant diagnostics from this hospitalization (including imaging, microbiology, ancillary and laboratory) are listed below for reference.     Procedures and Diagnostic Studies:   CT Head Wo Contrast  Result Date: 02/17/2021 CLINICAL DATA:  Weakness. EXAM: CT HEAD WITHOUT CONTRAST TECHNIQUE: Contiguous axial images were obtained from the base of the skull through the vertex without intravenous contrast. COMPARISON:  10/25/2011 FINDINGS: Brain: There is  central cortical atrophy. Significant periventricular white matter changes are consistent with small vessel disease. There is no intra or extra-axial fluid collection or mass lesion. The basilar cisterns and ventricles have a normal appearance. There is no CT evidence for acute infarction or hemorrhage. Vascular: Significant atherosclerotic calcification of the internal carotid arteries. No hyperdense vessels. Skull: Normal. Negative for fracture or focal lesion. Sinuses/Orbits: No acute finding. Other: None. IMPRESSION: 1. Atrophy and small vessel disease. 2.  No evidence for acute intracranial abnormality. Electronically Signed   By: Nolon Nations M.D.   On: 02/17/2021 16:59   CT Chest W Contrast  Result Date:  02/17/2021 CLINICAL DATA:  Possible mediastinal mass on recent chest x-ray EXAM: CT CHEST WITH CONTRAST TECHNIQUE: Multidetector CT imaging of the chest was performed during intravenous contrast administration. CONTRAST:  6mL OMNIPAQUE IOHEXOL 350 MG/ML SOLN COMPARISON:  Chest x-ray from earlier in the same day. FINDINGS: Cardiovascular: Thoracic aorta demonstrates atherosclerotic calcification as well as tortuosity. Mild aneurysmal dilatation of the ascending segment to 4.4 cm is noted. No evidence of dissection is seen. Heart is not significantly enlarged. Coronary calcifications are seen. The pulmonary artery as visualized shows no large central pulmonary embolus although not timed for embolus evaluation. Mediastinum/Nodes: Thoracic inlet demonstrates a 2 cm hypodense nodule within the left lobe of the thyroid. Mediastinum demonstrates prominent lipomatosis which accounts for the majority of the density seen on the rotated chest x-ray. A few scattered small lymph nodes with normal fatty hila are noted. The esophagus as visualized is within normal limits. Lungs/Pleura: The lungs are well aerated bilaterally. No focal infiltrate or effusion is noted. Minimal right basilar atelectasis is seen. Upper Abdomen: Diffuse density is noted throughout the gallbladder consistent with cholelithiasis. No wall thickening is noted. Fatty infiltration of the liver is seen. The remainder of the upper abdomen is unremarkable. Musculoskeletal: Degenerative changes of the thoracic spine are noted. Old healed rib fractures are noted on the right. No acute rib fracture is noted. Mild scoliosis of the thoracic spine also accentuates the mediastinal prominence on recent chest x-ray. IMPRESSION: Dilatation of the ascending aorta to 4.4 cm with tortuosity of the thoracic aorta. Recommend annual imaging followup by CTA or MRA. This recommendation follows 2010 ACCF/AHA/AATS/ACR/ASA/SCA/SCAI/SIR/STS/SVM Guidelines for the Diagnosis and  Management of Patients with Thoracic Aortic Disease. Circulation. 2010; 121: G644-I347. Aortic aneurysm NOS (ICD10-I71.9) 2 cm incidental left thyroid nodule. Recommend thyroid US. Reference: J Am Coll Radiol. 2015 Feb;12(2): 143-50 Mild mediastinal lipomatosis which also accentuates the mediastinal markings on recent chest x-ray. Cholelithiasis and fatty infiltration of the liver. Aortic Atherosclerosis (ICD10-I70.0). Electronically Signed   By: Inez Catalina M.D.   On: 02/17/2021 19:08   MR BRAIN WO CONTRAST  Result Date: 02/18/2021 CLINICAL DATA:  Neuro deficit with acute stroke suspected.  Weakness EXAM: MRI HEAD WITHOUT CONTRAST TECHNIQUE: Multiplanar, multiecho pulse sequences of the brain and surrounding structures were obtained without intravenous contrast. COMPARISON:  Head CT from yesterday FINDINGS: Brain: No acute infarction, hemorrhage, hydrocephalus, extra-axial collection or mass lesion. Brain atrophy greater than expected for age. T2 hyperintensity in the cerebral white matter presumably from chronic small vessel ischemia given medical history. Notable right inferior frontal encephalomalacia could be posttraumatic or post ischemic. Vascular: Grossly preserved flow voids. Skull and upper cervical spine: No gross marrow signal abnormality Sinuses/Orbits: Right enucleation with prosthesis. Other: Very motion degraded, findings could easily be obscured. IMPRESSION: 1. Significantly motion degraded.  No acute finding. 2. Premature cerebral volume loss and white matter disease.  Electronically Signed   By: Jorje Guild M.D.   On: 02/18/2021 05:06   MR THORACIC SPINE WO CONTRAST  Result Date: 02/18/2021 CLINICAL DATA:  Back trauma with abnormal neuro exam. CT or positive x-ray. EXAM: MRI THORACIC SPINE WITHOUT CONTRAST TECHNIQUE: Multiplanar, multisequence MR imaging of the thoracic spine was performed. No intravenous contrast was administered. COMPARISON:  Chest CT from yesterday.  Thoracic MRI  from 03/31/2017 FINDINGS: Alignment:  Mild scoliosis.  No traumatic malalignment. Vertebrae: Intervertebral ankylosis at T9-10. No fracture, discitis, or aggressive bone lesion. T5 hemangioma Cord:  Normal signal and morphology. Paraspinal and other soft tissues: No evidence of perispinal mass or inflammation. There is a longstanding ventral canal CSF intensity collection essentially involving the entire thoracic spine. Chronicity is inconsistent with infectious collection, hemorrhage, or active CSF leak. Severe muscular atrophy at the level of the right scapula. Disc levels: Generalized degenerative disc narrowing and bulging with endplate spurring greatest at the lower thoracic levels. Facet osteoarthritis also greatest at the lower thoracic levels. No degenerative cord impingement. There is mild spinal stenosis at T10-11 and T11-12. At the same levels disc bulging and facet spurring cause foraminal impingement. IMPRESSION: 1. No emergent finding.  Normal cord signal. 2. Ventral epidural collection which in retrospect is stable from 2018. No associated mass effect. Spinal degeneration especially at lower thoracic levels where there is foraminal impingement and noncompressive spinal stenosis. Electronically Signed   By: Jorje Guild M.D.   On: 02/18/2021 05:04   MR LUMBAR SPINE WO CONTRAST  Result Date: 02/18/2021 CLINICAL DATA:  Low back pain with cauda equina suspected EXAM: MRI LUMBAR SPINE WITHOUT CONTRAST TECHNIQUE: Multiplanar, multisequence MR imaging of the lumbar spine was performed. No intravenous contrast was administered. COMPARISON:  03/31/2017 FINDINGS: Segmentation:  5 lumbar type vertebrae Alignment:  Grade 1 anterolisthesis at L5-S1. Vertebrae: No acute fracture, evidence of discitis, or bone lesion. Remote L1 compression fracture. Conus medullaris and cauda equina: Conus extends to the L2 level. Conus and cauda equina appear normal. Paraspinal and other soft tissues: No evidence of  perispinal mass or inflammation. Trabeculated bladder. Disc levels: T12- L1: Disc bulging and mild facet spurring. Mild to moderate thecal sac narrowing primarily from short pedicles L1-L2: Disc narrowing and bulging with endplate spurring. Cystic appearing structure on sagittal images which is left eccentric and could be a synovial cyst or a recent herniation. Conus impingement. Biforaminal compressive stenosis. L2-L3: Rightward disc bulging and mild facet spurring. L3-L4: Mild disc bulging and facet spurring L4-L5: Disc narrowing and bulging. Degenerative facet spurring asymmetric to the left. Moderate left foraminal impingement. Patent spinal canal L5-S1:Anterolisthesis from chronic L5 pars defects. The disc is bulging with biforaminal impingement flattening the L5 nerve roots. Patent spinal canal. IMPRESSION: 1. L1-2 conus impingement due to short pedicles, degenerative disease, and a cystic structure which could be discal or synovial cyst. Biforaminal impingement at the same level. 2. 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 Electronically Signed   By: Jorje Guild M.D.   On: 02/18/2021 04:58   DG Chest Portable 1 View  Result Date: 02/17/2021 CLINICAL DATA:  Wheezing, weakness EXAM: PORTABLE CHEST 1 VIEW COMPARISON:  Chest radiograph dated 03/31/2017. FINDINGS: The patient is rotated which limits evaluation of the cardiac and mediastinal contours. Given this limitation, there is suggestion of an anterior mediastinal mass/density. Mild-to-moderate bilateral interstitial opacities appear greater on the left. There is no pleural effusion or pneumothorax. Degenerative changes are seen in the spine. IMPRESSION: 1. Limited exam  due to patient rotation, however there is suggestion of an anterior mediastinal mass/density. CT chest with contrast could be considered for further evaluation. 2. Bilateral interstitial opacities appear greater on the left and may represent  pulmonary edema or pneumonia. Electronically Signed   By: Zerita Boers M.D.   On: 02/17/2021 17:38     Labs:   Basic Metabolic Panel: Recent Labs  Lab 02/17/21 1407 02/19/21 0800  NA 142 140  K 4.2 4.0  CL 103 105  CO2 30 29  GLUCOSE 169* 120*  BUN 11 12  CREATININE 0.80 0.73  CALCIUM 9.4 8.9   GFR Estimated Creatinine Clearance: 97.8 mL/min (by C-G formula based on SCr of 0.73 mg/dL). Liver Function Tests: No results for input(s): AST, ALT, ALKPHOS, BILITOT, PROT, ALBUMIN in the last 168 hours. No results for input(s): LIPASE, AMYLASE in the last 168 hours. No results for input(s): AMMONIA in the last 168 hours. Coagulation profile No results for input(s): INR, PROTIME in the last 168 hours.  CBC: Recent Labs  Lab 02/17/21 1407  WBC 8.6  HGB 12.7*  HCT 39.8  MCV 90.2  PLT 281   Cardiac Enzymes: No results for input(s): CKTOTAL, CKMB, CKMBINDEX, TROPONINI in the last 168 hours. BNP: Invalid input(s): POCBNP CBG: Recent Labs  Lab 02/19/21 1135 02/19/21 1639 02/19/21 2309 02/20/21 0755 02/20/21 1123  GLUCAP 143* 104* 156* 89 94   D-Dimer No results for input(s): DDIMER in the last 72 hours. Hgb A1c No results for input(s): HGBA1C in the last 72 hours. Lipid Profile No results for input(s): CHOL, HDL, LDLCALC, TRIG, CHOLHDL, LDLDIRECT in the last 72 hours. Thyroid function studies Recent Labs    02/19/21 0752  TSH 2.257   Anemia work up No results for input(s): VITAMINB12, FOLATE, FERRITIN, TIBC, IRON, RETICCTPCT in the last 72 hours. Microbiology Recent Results (from the past 240 hour(s))  Resp Panel by RT-PCR (Flu A&B, Covid) Nasopharyngeal Swab     Status: None   Collection Time: 02/17/21  4:52 PM   Specimen: Nasopharyngeal Swab; Nasopharyngeal(NP) swabs in vial transport medium  Result Value Ref Range Status   SARS Coronavirus 2 by RT PCR NEGATIVE NEGATIVE Final    Comment: (NOTE) SARS-CoV-2 target nucleic acids are NOT DETECTED.  The  SARS-CoV-2 RNA is generally detectable in upper respiratory specimens during the acute phase of infection. The lowest concentration of SARS-CoV-2 viral copies this assay can detect is 138 copies/mL. A negative result does not preclude SARS-Cov-2 infection and should not be used as the sole basis for treatment or other patient management decisions. A negative result may occur with  improper specimen collection/handling, submission of specimen other than nasopharyngeal swab, presence of viral mutation(s) within the areas targeted by this assay, and inadequate number of viral copies(<138 copies/mL). A negative result must be combined with clinical observations, patient history, and epidemiological information. The expected result is Negative.  Fact Sheet for Patients:  EntrepreneurPulse.com.au  Fact Sheet for Healthcare Providers:  IncredibleEmployment.be  This test is no t yet approved or cleared by the [place] FDA and  has been authorized for detection and/or diagnosis of SARS-CoV-2 by FDA under an Emergency Use Authorization (EUA). This EUA will remain  in effect (meaning this test can be used) for the duration of the COVID-19 declaration under Section 564(b)(1) of the Act, 21 U.S.C.section 360bbb-3(b)(1), unless the authorization is terminated  or revoked sooner.       Influenza A by PCR NEGATIVE NEGATIVE Final   Influenza B  by PCR NEGATIVE NEGATIVE Final    Comment: (NOTE) The Xpert Xpress SARS-CoV-2/FLU/RSV plus assay is intended as an aid in the diagnosis of influenza from Nasopharyngeal swab specimens and should not be used as a sole basis for treatment. Nasal washings and aspirates are unacceptable for Xpert Xpress SARS-CoV-2/FLU/RSV testing.  Fact Sheet for Patients: EntrepreneurPulse.com.au  Fact Sheet for Healthcare Providers: IncredibleEmployment.be  This test is not yet approved or  cleared by the [place] FDA and has been authorized for detection and/or diagnosis of SARS-CoV-2 by FDA under an Emergency Use Authorization (EUA). This EUA will remain in effect (meaning this test can be used) for the duration of the COVID-19 declaration under Section 564(b)(1) of the Act, 21 U.S.C. section 360bbb-3(b)(1), unless the authorization is terminated or revoked.  Performed at [facility], [address] [place], [place] [address]      Discharge Instructions:   Discharge Instructions     Diet - low sodium heart healthy   Complete by: As directed    Diet Carb Modified   Complete by: As directed    Increase activity slowly   Complete by: As directed       Allergies as of 02/20/2021   No Known Allergies      Medication List     STOP taking these medications    aspirin 81 MG chewable tablet   meloxicam 15 MG tablet Commonly known as: MOBIC       TAKE these medications    atenolol 25 MG tablet Commonly known as: TENORMIN TAKE 1 TABLET BY MOUTH EVERY DAY   baclofen 10 MG tablet Commonly known as: LIORESAL Take 0.5 tablets (5 mg total) by mouth 2 (two) times daily as needed for muscle spasms.   gabapentin 300 MG capsule Commonly known as: Neurontin Take 1 capsule (300 mg total) by mouth 3 (three) times daily.   metFORMIN 500 MG tablet Commonly known as: GLUCOPHAGE Take 500 MG (1 tablet) by mouth once daily.   multivitamin tablet Take 1 tablet by mouth daily.   OLANZapine 20 MG tablet Commonly known as: ZYPREXA Take 2 tablets (40 mg total) by mouth at bedtime. What changed: how much to take   rosuvastatin 5 MG tablet Commonly known as: Crestor Take 1 tablet (5 mg total) by mouth daily.        Follow-up Information     Deetta Perla, MD. Schedule an appointment as soon as possible for a visit in 2 week(s).   Specialty: Neurosurgery Contact information: [place] [address] [phone]                    If you experience worsening of your admission symptoms, develop shortness of breath, life threatening emergency, suicidal or homicidal thoughts you must seek medical attention immediately by calling 911 or calling your MD immediately  if symptoms less severe.   You must read complete instructions/literature along with all the possible adverse reactions/side effects for all the medicines you take and that have been prescribed to you. Take any new medicines after you have completely understood and accept all the possible adverse reactions/side effects.    Please note   You were cared for by a hospitalist during your hospital stay. If you have any questions about your discharge medications or the care you received while you were in the hospital after you are discharged, you can call the unit and asked to speak with the hospitalist on call if the hospitalist  that took care of you is not available. Once you are discharged, your primary care physician will handle any further medical issues. Please note that NO REFILLS for any discharge medications will be authorized once you are discharged, as it is imperative that you return to your primary care physician (or establish a relationship with a primary care physician if you do not have one) for your aftercare needs so that they can reassess your need for medications and monitor your lab values.       Time coordinating discharge: 32 minutes  Signed:  Autry Prust  [facility] 02/20/2021, 3:34 PM   Pager on [email or web address]. If 7PM-7AM, please contact night-coverage at [email or web address]

## 2021-02-20 NOTE — Progress Notes (Signed)
DISCHARGE NOTE:  Pt given discharge instructions. Pt verbalized understanding. Pt wheeled to car by staff. Pts friends providing transportation.

## 2021-02-21 ENCOUNTER — Telehealth: Payer: Self-pay

## 2021-02-21 NOTE — Telephone Encounter (Signed)
Copied from [place] [phone]. Topic: General - Other >> Feb 21, 2021  2:42 PM Tessa Lerner A wrote: Reason for CRM: The patient has made contact to request orders   The patient would like for a physical therapist to visit them at home for strengthening their foot  The patient shares that they have concerns related to neuropathy and would like to continue working on their circulation and mobility   Please contact further when possible

## 2021-02-21 NOTE — Telephone Encounter (Signed)
Transition Care Management Follow-up Telephone Call Date of discharge and from where: 02/20/2021 [facility] How have you been since you were released from the hospital? Doing good. Getting around with walker Any questions or concerns? No  Items Reviewed: Did the pt receive and understand the discharge instructions provided? Yes  Medications obtained and verified? Yes  Other? No  Any new allergies since your discharge? No  Dietary orders reviewed? Yes Do you have support at home? Yes   Home Care and Equipment/Supplies: Were home health services ordered? yes If so, what is the name of the agency? unknown  Has the agency set up a time to come to the patient's home? no Were any new equipment or medical supplies ordered?  No What is the name of the medical supply agency? N/a Were you able to get the supplies/equipment? not applicable Do you have any questions related to the use of the equipment or supplies? No  Functional Questionnaire: (I = Independent and D = Dependent) ADLs: I  Bathing/Dressing- I  Meal Prep- I  Eating- I  Maintaining continence- I  Transferring/Ambulation- I  Managing Meds- I  Follow up appointments reviewed:  PCP Hospital f/u appt confirmed? Yes  Scheduled to see [name] NP on 03/12/2021 @ 3:00. Are transportation arrangements needed? No  If their condition worsens, is the pt aware to call PCP or go to the Emergency Dept.? Yes Was the patient provided with contact information for the PCP's office or ED? Yes Was to pt encouraged to call back with questions or concerns? Yes

## 2021-02-22 ENCOUNTER — Telehealth: Payer: Self-pay

## 2021-02-22 NOTE — Chronic Care Management (AMB) (Signed)
Marked as error note

## 2021-02-23 ENCOUNTER — Telehealth: Payer: Self-pay | Admitting: Nurse Practitioner

## 2021-02-23 DIAGNOSIS — E785 Hyperlipidemia, unspecified: Secondary | ICD-10-CM | POA: Diagnosis not present

## 2021-02-23 DIAGNOSIS — Z87891 Personal history of nicotine dependence: Secondary | ICD-10-CM | POA: Diagnosis not present

## 2021-02-23 DIAGNOSIS — E1169 Type 2 diabetes mellitus with other specified complication: Secondary | ICD-10-CM | POA: Diagnosis not present

## 2021-02-23 DIAGNOSIS — M5136 Other intervertebral disc degeneration, lumbar region: Secondary | ICD-10-CM | POA: Diagnosis not present

## 2021-02-23 DIAGNOSIS — M48061 Spinal stenosis, lumbar region without neurogenic claudication: Secondary | ICD-10-CM | POA: Diagnosis not present

## 2021-02-23 DIAGNOSIS — I10 Essential (primary) hypertension: Secondary | ICD-10-CM | POA: Diagnosis not present

## 2021-02-23 DIAGNOSIS — Z89201 Acquired absence of right upper limb, unspecified level: Secondary | ICD-10-CM | POA: Diagnosis not present

## 2021-02-23 DIAGNOSIS — E1142 Type 2 diabetes mellitus with diabetic polyneuropathy: Secondary | ICD-10-CM | POA: Diagnosis not present

## 2021-02-23 DIAGNOSIS — G8929 Other chronic pain: Secondary | ICD-10-CM | POA: Diagnosis not present

## 2021-02-23 DIAGNOSIS — Z791 Long term (current) use of non-steroidal anti-inflammatories (NSAID): Secondary | ICD-10-CM | POA: Diagnosis not present

## 2021-02-23 DIAGNOSIS — Z7984 Long term (current) use of oral hypoglycemic drugs: Secondary | ICD-10-CM | POA: Diagnosis not present

## 2021-02-23 DIAGNOSIS — R293 Abnormal posture: Secondary | ICD-10-CM | POA: Diagnosis not present

## 2021-02-23 DIAGNOSIS — Z9181 History of falling: Secondary | ICD-10-CM | POA: Diagnosis not present

## 2021-02-23 DIAGNOSIS — E041 Nontoxic single thyroid nodule: Secondary | ICD-10-CM | POA: Diagnosis not present

## 2021-02-23 NOTE — Telephone Encounter (Signed)
Copied from [place] [id number]. Topic: General - Inquiry >> Feb 23, 2021  1:21 PM Oneta Rack wrote: Osvaldo Human name: Gerald Stabs  Relation to pt: PT from [facility]  Call back number: [phone]    Reason for call:  Verbal orders for nursing assistance for medication assessment evaluation and PT 2x 4 1x 4

## 2021-02-23 NOTE — Telephone Encounter (Signed)
Spoke with Gerald Stabs and provided verbal orders for patient. Gerald Stabs verbalized understanding and has no further questions at this time.

## 2021-02-27 DIAGNOSIS — E785 Hyperlipidemia, unspecified: Secondary | ICD-10-CM | POA: Diagnosis not present

## 2021-02-27 DIAGNOSIS — Z7984 Long term (current) use of oral hypoglycemic drugs: Secondary | ICD-10-CM | POA: Diagnosis not present

## 2021-02-27 DIAGNOSIS — E1169 Type 2 diabetes mellitus with other specified complication: Secondary | ICD-10-CM | POA: Diagnosis not present

## 2021-02-27 DIAGNOSIS — Z89201 Acquired absence of right upper limb, unspecified level: Secondary | ICD-10-CM | POA: Diagnosis not present

## 2021-02-27 DIAGNOSIS — G8929 Other chronic pain: Secondary | ICD-10-CM | POA: Diagnosis not present

## 2021-02-27 DIAGNOSIS — M5136 Other intervertebral disc degeneration, lumbar region: Secondary | ICD-10-CM | POA: Diagnosis not present

## 2021-02-27 DIAGNOSIS — Z9181 History of falling: Secondary | ICD-10-CM | POA: Diagnosis not present

## 2021-02-27 DIAGNOSIS — I10 Essential (primary) hypertension: Secondary | ICD-10-CM | POA: Diagnosis not present

## 2021-02-27 DIAGNOSIS — R293 Abnormal posture: Secondary | ICD-10-CM | POA: Diagnosis not present

## 2021-02-27 DIAGNOSIS — Z87891 Personal history of nicotine dependence: Secondary | ICD-10-CM | POA: Diagnosis not present

## 2021-02-27 DIAGNOSIS — E1142 Type 2 diabetes mellitus with diabetic polyneuropathy: Secondary | ICD-10-CM | POA: Diagnosis not present

## 2021-02-27 DIAGNOSIS — Z791 Long term (current) use of non-steroidal anti-inflammatories (NSAID): Secondary | ICD-10-CM | POA: Diagnosis not present

## 2021-02-27 DIAGNOSIS — E041 Nontoxic single thyroid nodule: Secondary | ICD-10-CM | POA: Diagnosis not present

## 2021-02-27 DIAGNOSIS — M48061 Spinal stenosis, lumbar region without neurogenic claudication: Secondary | ICD-10-CM | POA: Diagnosis not present

## 2021-03-01 DIAGNOSIS — Z7984 Long term (current) use of oral hypoglycemic drugs: Secondary | ICD-10-CM | POA: Diagnosis not present

## 2021-03-01 DIAGNOSIS — I10 Essential (primary) hypertension: Secondary | ICD-10-CM | POA: Diagnosis not present

## 2021-03-01 DIAGNOSIS — G8929 Other chronic pain: Secondary | ICD-10-CM | POA: Diagnosis not present

## 2021-03-01 DIAGNOSIS — E785 Hyperlipidemia, unspecified: Secondary | ICD-10-CM | POA: Diagnosis not present

## 2021-03-01 DIAGNOSIS — Z87891 Personal history of nicotine dependence: Secondary | ICD-10-CM | POA: Diagnosis not present

## 2021-03-01 DIAGNOSIS — E041 Nontoxic single thyroid nodule: Secondary | ICD-10-CM | POA: Diagnosis not present

## 2021-03-01 DIAGNOSIS — E1142 Type 2 diabetes mellitus with diabetic polyneuropathy: Secondary | ICD-10-CM | POA: Diagnosis not present

## 2021-03-01 DIAGNOSIS — R293 Abnormal posture: Secondary | ICD-10-CM | POA: Diagnosis not present

## 2021-03-01 DIAGNOSIS — Z9181 History of falling: Secondary | ICD-10-CM | POA: Diagnosis not present

## 2021-03-01 DIAGNOSIS — E1169 Type 2 diabetes mellitus with other specified complication: Secondary | ICD-10-CM | POA: Diagnosis not present

## 2021-03-01 DIAGNOSIS — Z89201 Acquired absence of right upper limb, unspecified level: Secondary | ICD-10-CM | POA: Diagnosis not present

## 2021-03-01 DIAGNOSIS — M48061 Spinal stenosis, lumbar region without neurogenic claudication: Secondary | ICD-10-CM | POA: Diagnosis not present

## 2021-03-01 DIAGNOSIS — M5136 Other intervertebral disc degeneration, lumbar region: Secondary | ICD-10-CM | POA: Diagnosis not present

## 2021-03-01 DIAGNOSIS — Z791 Long term (current) use of non-steroidal anti-inflammatories (NSAID): Secondary | ICD-10-CM | POA: Diagnosis not present

## 2021-03-02 DIAGNOSIS — I10 Essential (primary) hypertension: Secondary | ICD-10-CM | POA: Diagnosis not present

## 2021-03-02 DIAGNOSIS — Z791 Long term (current) use of non-steroidal anti-inflammatories (NSAID): Secondary | ICD-10-CM | POA: Diagnosis not present

## 2021-03-02 DIAGNOSIS — M48061 Spinal stenosis, lumbar region without neurogenic claudication: Secondary | ICD-10-CM | POA: Diagnosis not present

## 2021-03-02 DIAGNOSIS — Z7984 Long term (current) use of oral hypoglycemic drugs: Secondary | ICD-10-CM | POA: Diagnosis not present

## 2021-03-02 DIAGNOSIS — G8929 Other chronic pain: Secondary | ICD-10-CM | POA: Diagnosis not present

## 2021-03-02 DIAGNOSIS — E785 Hyperlipidemia, unspecified: Secondary | ICD-10-CM | POA: Diagnosis not present

## 2021-03-02 DIAGNOSIS — R293 Abnormal posture: Secondary | ICD-10-CM | POA: Diagnosis not present

## 2021-03-02 DIAGNOSIS — E1142 Type 2 diabetes mellitus with diabetic polyneuropathy: Secondary | ICD-10-CM | POA: Diagnosis not present

## 2021-03-02 DIAGNOSIS — Z9181 History of falling: Secondary | ICD-10-CM | POA: Diagnosis not present

## 2021-03-02 DIAGNOSIS — M5136 Other intervertebral disc degeneration, lumbar region: Secondary | ICD-10-CM | POA: Diagnosis not present

## 2021-03-02 DIAGNOSIS — Z89201 Acquired absence of right upper limb, unspecified level: Secondary | ICD-10-CM | POA: Diagnosis not present

## 2021-03-02 DIAGNOSIS — Z87891 Personal history of nicotine dependence: Secondary | ICD-10-CM | POA: Diagnosis not present

## 2021-03-02 DIAGNOSIS — E041 Nontoxic single thyroid nodule: Secondary | ICD-10-CM | POA: Diagnosis not present

## 2021-03-02 DIAGNOSIS — E1169 Type 2 diabetes mellitus with other specified complication: Secondary | ICD-10-CM | POA: Diagnosis not present

## 2021-03-03 ENCOUNTER — Other Ambulatory Visit: Payer: Self-pay | Admitting: Nurse Practitioner

## 2021-03-03 NOTE — Telephone Encounter (Signed)
Requested Prescriptions  Pending Prescriptions Disp Refills  . atenolol (TENORMIN) 25 MG tablet [Pharmacy Med Name: ATENOLOL 25 MG TABLET] 90 tablet 1    Sig: TAKE 1 TABLET BY MOUTH EVERY DAY     Cardiovascular:  Beta Blockers Failed - 03/03/2021 12:47 AM      Failed - Last BP in normal range    BP Readings from Last 1 Encounters:  02/20/21 (!) 145/68         Passed - Last Heart Rate in normal range    Pulse Readings from Last 1 Encounters:  02/20/21 67         Passed - Valid encounter within last 6 months    Recent Outpatient Visits          2 weeks ago Type 2 diabetes mellitus with morbid obesity ([place])   [place], [place] T, NP   3 months ago Coughing up blood   [place], [place] Glad, NP   4 months ago Type 2 diabetes mellitus with morbid obesity ([place])   [place], [place] T, NP   1 year ago Type 2 diabetes mellitus with morbid obesity ([place])   [place], Henrine Screws T, NP   1 year ago Paranoid schizophrenia ([place])   [place], [name], NP      Future Appointments            In 1 week [place], [name], NP [employer], [place]   In 2 months Cannady, [name], NP [employer], [facility]

## 2021-03-05 ENCOUNTER — Telehealth: Payer: Self-pay | Admitting: Nurse Practitioner

## 2021-03-05 DIAGNOSIS — Z9181 History of falling: Secondary | ICD-10-CM | POA: Diagnosis not present

## 2021-03-05 DIAGNOSIS — Z87891 Personal history of nicotine dependence: Secondary | ICD-10-CM | POA: Diagnosis not present

## 2021-03-05 DIAGNOSIS — I10 Essential (primary) hypertension: Secondary | ICD-10-CM | POA: Diagnosis not present

## 2021-03-05 DIAGNOSIS — E785 Hyperlipidemia, unspecified: Secondary | ICD-10-CM | POA: Diagnosis not present

## 2021-03-05 DIAGNOSIS — M48061 Spinal stenosis, lumbar region without neurogenic claudication: Secondary | ICD-10-CM | POA: Diagnosis not present

## 2021-03-05 DIAGNOSIS — Z7984 Long term (current) use of oral hypoglycemic drugs: Secondary | ICD-10-CM | POA: Diagnosis not present

## 2021-03-05 DIAGNOSIS — E041 Nontoxic single thyroid nodule: Secondary | ICD-10-CM | POA: Diagnosis not present

## 2021-03-05 DIAGNOSIS — Z89201 Acquired absence of right upper limb, unspecified level: Secondary | ICD-10-CM | POA: Diagnosis not present

## 2021-03-05 DIAGNOSIS — Z791 Long term (current) use of non-steroidal anti-inflammatories (NSAID): Secondary | ICD-10-CM | POA: Diagnosis not present

## 2021-03-05 DIAGNOSIS — E1142 Type 2 diabetes mellitus with diabetic polyneuropathy: Secondary | ICD-10-CM | POA: Diagnosis not present

## 2021-03-05 DIAGNOSIS — E1169 Type 2 diabetes mellitus with other specified complication: Secondary | ICD-10-CM | POA: Diagnosis not present

## 2021-03-05 DIAGNOSIS — M5136 Other intervertebral disc degeneration, lumbar region: Secondary | ICD-10-CM | POA: Diagnosis not present

## 2021-03-05 DIAGNOSIS — G8929 Other chronic pain: Secondary | ICD-10-CM | POA: Diagnosis not present

## 2021-03-05 DIAGNOSIS — R293 Abnormal posture: Secondary | ICD-10-CM | POA: Diagnosis not present

## 2021-03-05 NOTE — Telephone Encounter (Signed)
Ryan [name] , PT with [facility] states pt told him he fell this morning.  Pt was able to get himself back up.  Pt did not appear to have gotten hurt.  [phone]

## 2021-03-08 DIAGNOSIS — M5136 Other intervertebral disc degeneration, lumbar region: Secondary | ICD-10-CM | POA: Diagnosis not present

## 2021-03-08 DIAGNOSIS — Z7984 Long term (current) use of oral hypoglycemic drugs: Secondary | ICD-10-CM | POA: Diagnosis not present

## 2021-03-08 DIAGNOSIS — G8929 Other chronic pain: Secondary | ICD-10-CM | POA: Diagnosis not present

## 2021-03-08 DIAGNOSIS — Z9181 History of falling: Secondary | ICD-10-CM | POA: Diagnosis not present

## 2021-03-08 DIAGNOSIS — I10 Essential (primary) hypertension: Secondary | ICD-10-CM | POA: Diagnosis not present

## 2021-03-08 DIAGNOSIS — E041 Nontoxic single thyroid nodule: Secondary | ICD-10-CM | POA: Diagnosis not present

## 2021-03-08 DIAGNOSIS — E785 Hyperlipidemia, unspecified: Secondary | ICD-10-CM | POA: Diagnosis not present

## 2021-03-08 DIAGNOSIS — E1169 Type 2 diabetes mellitus with other specified complication: Secondary | ICD-10-CM | POA: Diagnosis not present

## 2021-03-08 DIAGNOSIS — R293 Abnormal posture: Secondary | ICD-10-CM | POA: Diagnosis not present

## 2021-03-08 DIAGNOSIS — Z89201 Acquired absence of right upper limb, unspecified level: Secondary | ICD-10-CM | POA: Diagnosis not present

## 2021-03-08 DIAGNOSIS — E1142 Type 2 diabetes mellitus with diabetic polyneuropathy: Secondary | ICD-10-CM | POA: Diagnosis not present

## 2021-03-08 DIAGNOSIS — M48061 Spinal stenosis, lumbar region without neurogenic claudication: Secondary | ICD-10-CM | POA: Diagnosis not present

## 2021-03-08 DIAGNOSIS — Z791 Long term (current) use of non-steroidal anti-inflammatories (NSAID): Secondary | ICD-10-CM | POA: Diagnosis not present

## 2021-03-08 DIAGNOSIS — Z87891 Personal history of nicotine dependence: Secondary | ICD-10-CM | POA: Diagnosis not present

## 2021-03-11 ENCOUNTER — Other Ambulatory Visit: Payer: Self-pay | Admitting: Nurse Practitioner

## 2021-03-11 NOTE — Telephone Encounter (Signed)
Requested medication (s) are due for refill today: yes  Requested medication (s) are on the active medication list: yes  Last refill:  02/14/21 #30  Future visit scheduled: yes  Notes to clinic:  med not delegated to NT to RF   Requested Prescriptions  Pending Prescriptions Disp Refills   baclofen (LIORESAL) 10 MG tablet [Pharmacy Med Name: BACLOFEN 10 MG TABLET] 30 tablet     Sig: Take 0.5 tablets (5 mg total) by mouth 2 (two) times daily as needed for muscle spasms.     Not Delegated - Analgesics:  Muscle Relaxants Failed - 03/11/2021  9:32 AM      Failed - This refill cannot be delegated      Passed - Valid encounter within last 6 months    Recent Outpatient Visits           3 weeks ago Type 2 diabetes mellitus with morbid obesity ([place])   [place], [place] T, NP   3 months ago Coughing up blood   [place], Santiago Glad, NP   4 months ago Type 2 diabetes mellitus with morbid obesity ([place])   [facility], [place] T, NP   1 year ago Type 2 diabetes mellitus with morbid obesity ([place])   [place], [name], NP   1 year ago Paranoid schizophrenia ([place])   [place], [name], NP       Future Appointments             In 2 months Cann[place], [name], NP [facility]

## 2021-03-12 ENCOUNTER — Inpatient Hospital Stay: Payer: Medicare Other | Admitting: Nurse Practitioner

## 2021-03-12 DIAGNOSIS — Z89201 Acquired absence of right upper limb, unspecified level: Secondary | ICD-10-CM | POA: Diagnosis not present

## 2021-03-12 DIAGNOSIS — E1142 Type 2 diabetes mellitus with diabetic polyneuropathy: Secondary | ICD-10-CM | POA: Diagnosis not present

## 2021-03-12 DIAGNOSIS — E1169 Type 2 diabetes mellitus with other specified complication: Secondary | ICD-10-CM | POA: Diagnosis not present

## 2021-03-12 DIAGNOSIS — R293 Abnormal posture: Secondary | ICD-10-CM | POA: Diagnosis not present

## 2021-03-12 DIAGNOSIS — Z7984 Long term (current) use of oral hypoglycemic drugs: Secondary | ICD-10-CM | POA: Diagnosis not present

## 2021-03-12 DIAGNOSIS — Z9181 History of falling: Secondary | ICD-10-CM | POA: Diagnosis not present

## 2021-03-12 DIAGNOSIS — E041 Nontoxic single thyroid nodule: Secondary | ICD-10-CM | POA: Diagnosis not present

## 2021-03-12 DIAGNOSIS — M48061 Spinal stenosis, lumbar region without neurogenic claudication: Secondary | ICD-10-CM | POA: Diagnosis not present

## 2021-03-12 DIAGNOSIS — E785 Hyperlipidemia, unspecified: Secondary | ICD-10-CM | POA: Diagnosis not present

## 2021-03-12 DIAGNOSIS — G8929 Other chronic pain: Secondary | ICD-10-CM | POA: Diagnosis not present

## 2021-03-12 DIAGNOSIS — M5136 Other intervertebral disc degeneration, lumbar region: Secondary | ICD-10-CM | POA: Diagnosis not present

## 2021-03-12 DIAGNOSIS — Z87891 Personal history of nicotine dependence: Secondary | ICD-10-CM | POA: Diagnosis not present

## 2021-03-12 DIAGNOSIS — Z791 Long term (current) use of non-steroidal anti-inflammatories (NSAID): Secondary | ICD-10-CM | POA: Diagnosis not present

## 2021-03-12 DIAGNOSIS — I10 Essential (primary) hypertension: Secondary | ICD-10-CM | POA: Diagnosis not present

## 2021-03-12 NOTE — Telephone Encounter (Signed)
Just an fyi

## 2021-03-12 NOTE — Telephone Encounter (Signed)
Noted, will follow-up as needed if ongoing.

## 2021-03-12 NOTE — Telephone Encounter (Signed)
Please see below and advise.

## 2021-03-14 ENCOUNTER — Encounter: Payer: Self-pay | Admitting: Internal Medicine

## 2021-03-14 DIAGNOSIS — E785 Hyperlipidemia, unspecified: Secondary | ICD-10-CM | POA: Diagnosis not present

## 2021-03-14 DIAGNOSIS — Z9181 History of falling: Secondary | ICD-10-CM | POA: Diagnosis not present

## 2021-03-14 DIAGNOSIS — M5136 Other intervertebral disc degeneration, lumbar region: Secondary | ICD-10-CM | POA: Diagnosis not present

## 2021-03-14 DIAGNOSIS — Z87891 Personal history of nicotine dependence: Secondary | ICD-10-CM | POA: Diagnosis not present

## 2021-03-14 DIAGNOSIS — I10 Essential (primary) hypertension: Secondary | ICD-10-CM | POA: Diagnosis not present

## 2021-03-14 DIAGNOSIS — Z7984 Long term (current) use of oral hypoglycemic drugs: Secondary | ICD-10-CM | POA: Diagnosis not present

## 2021-03-14 DIAGNOSIS — E1169 Type 2 diabetes mellitus with other specified complication: Secondary | ICD-10-CM | POA: Diagnosis not present

## 2021-03-14 DIAGNOSIS — R293 Abnormal posture: Secondary | ICD-10-CM | POA: Diagnosis not present

## 2021-03-14 DIAGNOSIS — Z89201 Acquired absence of right upper limb, unspecified level: Secondary | ICD-10-CM | POA: Diagnosis not present

## 2021-03-14 DIAGNOSIS — E041 Nontoxic single thyroid nodule: Secondary | ICD-10-CM | POA: Diagnosis not present

## 2021-03-14 DIAGNOSIS — G8929 Other chronic pain: Secondary | ICD-10-CM | POA: Diagnosis not present

## 2021-03-14 DIAGNOSIS — Z791 Long term (current) use of non-steroidal anti-inflammatories (NSAID): Secondary | ICD-10-CM | POA: Diagnosis not present

## 2021-03-14 DIAGNOSIS — M48061 Spinal stenosis, lumbar region without neurogenic claudication: Secondary | ICD-10-CM | POA: Diagnosis not present

## 2021-03-14 DIAGNOSIS — E1142 Type 2 diabetes mellitus with diabetic polyneuropathy: Secondary | ICD-10-CM | POA: Diagnosis not present

## 2021-03-14 LAB — CYTOLOGY - NON PAP

## 2021-03-19 DIAGNOSIS — Z7984 Long term (current) use of oral hypoglycemic drugs: Secondary | ICD-10-CM | POA: Diagnosis not present

## 2021-03-19 DIAGNOSIS — Z87891 Personal history of nicotine dependence: Secondary | ICD-10-CM | POA: Diagnosis not present

## 2021-03-19 DIAGNOSIS — E041 Nontoxic single thyroid nodule: Secondary | ICD-10-CM | POA: Diagnosis not present

## 2021-03-19 DIAGNOSIS — R293 Abnormal posture: Secondary | ICD-10-CM | POA: Diagnosis not present

## 2021-03-19 DIAGNOSIS — E1142 Type 2 diabetes mellitus with diabetic polyneuropathy: Secondary | ICD-10-CM | POA: Diagnosis not present

## 2021-03-19 DIAGNOSIS — M5136 Other intervertebral disc degeneration, lumbar region: Secondary | ICD-10-CM | POA: Diagnosis not present

## 2021-03-19 DIAGNOSIS — Z9181 History of falling: Secondary | ICD-10-CM | POA: Diagnosis not present

## 2021-03-19 DIAGNOSIS — I10 Essential (primary) hypertension: Secondary | ICD-10-CM | POA: Diagnosis not present

## 2021-03-19 DIAGNOSIS — E1169 Type 2 diabetes mellitus with other specified complication: Secondary | ICD-10-CM | POA: Diagnosis not present

## 2021-03-19 DIAGNOSIS — Z89201 Acquired absence of right upper limb, unspecified level: Secondary | ICD-10-CM | POA: Diagnosis not present

## 2021-03-19 DIAGNOSIS — G8929 Other chronic pain: Secondary | ICD-10-CM | POA: Diagnosis not present

## 2021-03-19 DIAGNOSIS — E785 Hyperlipidemia, unspecified: Secondary | ICD-10-CM | POA: Diagnosis not present

## 2021-03-19 DIAGNOSIS — M48061 Spinal stenosis, lumbar region without neurogenic claudication: Secondary | ICD-10-CM | POA: Diagnosis not present

## 2021-03-19 DIAGNOSIS — Z791 Long term (current) use of non-steroidal anti-inflammatories (NSAID): Secondary | ICD-10-CM | POA: Diagnosis not present

## 2021-03-21 DIAGNOSIS — I10 Essential (primary) hypertension: Secondary | ICD-10-CM | POA: Diagnosis not present

## 2021-03-21 DIAGNOSIS — E041 Nontoxic single thyroid nodule: Secondary | ICD-10-CM | POA: Diagnosis not present

## 2021-03-21 DIAGNOSIS — M48061 Spinal stenosis, lumbar region without neurogenic claudication: Secondary | ICD-10-CM | POA: Diagnosis not present

## 2021-03-21 DIAGNOSIS — Z89201 Acquired absence of right upper limb, unspecified level: Secondary | ICD-10-CM | POA: Diagnosis not present

## 2021-03-21 DIAGNOSIS — E1142 Type 2 diabetes mellitus with diabetic polyneuropathy: Secondary | ICD-10-CM | POA: Diagnosis not present

## 2021-03-21 DIAGNOSIS — Z9181 History of falling: Secondary | ICD-10-CM | POA: Diagnosis not present

## 2021-03-21 DIAGNOSIS — M5136 Other intervertebral disc degeneration, lumbar region: Secondary | ICD-10-CM | POA: Diagnosis not present

## 2021-03-21 DIAGNOSIS — Z87891 Personal history of nicotine dependence: Secondary | ICD-10-CM | POA: Diagnosis not present

## 2021-03-21 DIAGNOSIS — E785 Hyperlipidemia, unspecified: Secondary | ICD-10-CM | POA: Diagnosis not present

## 2021-03-21 DIAGNOSIS — Z7984 Long term (current) use of oral hypoglycemic drugs: Secondary | ICD-10-CM | POA: Diagnosis not present

## 2021-03-21 DIAGNOSIS — E1169 Type 2 diabetes mellitus with other specified complication: Secondary | ICD-10-CM | POA: Diagnosis not present

## 2021-03-21 DIAGNOSIS — R293 Abnormal posture: Secondary | ICD-10-CM | POA: Diagnosis not present

## 2021-03-21 DIAGNOSIS — G8929 Other chronic pain: Secondary | ICD-10-CM | POA: Diagnosis not present

## 2021-03-21 DIAGNOSIS — Z791 Long term (current) use of non-steroidal anti-inflammatories (NSAID): Secondary | ICD-10-CM | POA: Diagnosis not present

## 2021-03-22 DIAGNOSIS — Z791 Long term (current) use of non-steroidal anti-inflammatories (NSAID): Secondary | ICD-10-CM | POA: Diagnosis not present

## 2021-03-22 DIAGNOSIS — G8929 Other chronic pain: Secondary | ICD-10-CM | POA: Diagnosis not present

## 2021-03-22 DIAGNOSIS — Z9181 History of falling: Secondary | ICD-10-CM | POA: Diagnosis not present

## 2021-03-22 DIAGNOSIS — I10 Essential (primary) hypertension: Secondary | ICD-10-CM | POA: Diagnosis not present

## 2021-03-22 DIAGNOSIS — Z89201 Acquired absence of right upper limb, unspecified level: Secondary | ICD-10-CM | POA: Diagnosis not present

## 2021-03-22 DIAGNOSIS — M5136 Other intervertebral disc degeneration, lumbar region: Secondary | ICD-10-CM | POA: Diagnosis not present

## 2021-03-22 DIAGNOSIS — Z7984 Long term (current) use of oral hypoglycemic drugs: Secondary | ICD-10-CM | POA: Diagnosis not present

## 2021-03-22 DIAGNOSIS — E1169 Type 2 diabetes mellitus with other specified complication: Secondary | ICD-10-CM | POA: Diagnosis not present

## 2021-03-22 DIAGNOSIS — R293 Abnormal posture: Secondary | ICD-10-CM | POA: Diagnosis not present

## 2021-03-22 DIAGNOSIS — E041 Nontoxic single thyroid nodule: Secondary | ICD-10-CM | POA: Diagnosis not present

## 2021-03-22 DIAGNOSIS — E785 Hyperlipidemia, unspecified: Secondary | ICD-10-CM | POA: Diagnosis not present

## 2021-03-22 DIAGNOSIS — E1142 Type 2 diabetes mellitus with diabetic polyneuropathy: Secondary | ICD-10-CM | POA: Diagnosis not present

## 2021-03-22 DIAGNOSIS — M48061 Spinal stenosis, lumbar region without neurogenic claudication: Secondary | ICD-10-CM | POA: Diagnosis not present

## 2021-03-22 DIAGNOSIS — Z87891 Personal history of nicotine dependence: Secondary | ICD-10-CM | POA: Diagnosis not present

## 2021-03-26 ENCOUNTER — Other Ambulatory Visit: Payer: Self-pay | Admitting: Nurse Practitioner

## 2021-03-26 DIAGNOSIS — I10 Essential (primary) hypertension: Secondary | ICD-10-CM | POA: Diagnosis not present

## 2021-03-26 DIAGNOSIS — G8929 Other chronic pain: Secondary | ICD-10-CM | POA: Diagnosis not present

## 2021-03-26 DIAGNOSIS — Z7984 Long term (current) use of oral hypoglycemic drugs: Secondary | ICD-10-CM | POA: Diagnosis not present

## 2021-03-26 DIAGNOSIS — Z87891 Personal history of nicotine dependence: Secondary | ICD-10-CM | POA: Diagnosis not present

## 2021-03-26 DIAGNOSIS — Z791 Long term (current) use of non-steroidal anti-inflammatories (NSAID): Secondary | ICD-10-CM | POA: Diagnosis not present

## 2021-03-26 DIAGNOSIS — E785 Hyperlipidemia, unspecified: Secondary | ICD-10-CM | POA: Diagnosis not present

## 2021-03-26 DIAGNOSIS — M48061 Spinal stenosis, lumbar region without neurogenic claudication: Secondary | ICD-10-CM | POA: Diagnosis not present

## 2021-03-26 DIAGNOSIS — E041 Nontoxic single thyroid nodule: Secondary | ICD-10-CM | POA: Diagnosis not present

## 2021-03-26 DIAGNOSIS — R293 Abnormal posture: Secondary | ICD-10-CM | POA: Diagnosis not present

## 2021-03-26 DIAGNOSIS — M5136 Other intervertebral disc degeneration, lumbar region: Secondary | ICD-10-CM | POA: Diagnosis not present

## 2021-03-26 DIAGNOSIS — E1142 Type 2 diabetes mellitus with diabetic polyneuropathy: Secondary | ICD-10-CM | POA: Diagnosis not present

## 2021-03-26 DIAGNOSIS — E1169 Type 2 diabetes mellitus with other specified complication: Secondary | ICD-10-CM | POA: Diagnosis not present

## 2021-03-26 DIAGNOSIS — Z9181 History of falling: Secondary | ICD-10-CM | POA: Diagnosis not present

## 2021-03-26 DIAGNOSIS — Z89201 Acquired absence of right upper limb, unspecified level: Secondary | ICD-10-CM | POA: Diagnosis not present

## 2021-03-26 NOTE — Telephone Encounter (Signed)
Requested medication (s) are due for refill today: DUe 04/12/21  Requested medication (s) are on the active medication list: yes  Last refill: 03/12/21  #30  0 refills  Future visit scheduled yes  05/15/21  Notes to clinic:Not delegated  Requested Prescriptions  Pending Prescriptions Disp Refills   baclofen (LIORESAL) 10 MG tablet [Pharmacy Med Name: BACLOFEN 10 MG TABLET] 90 tablet 1    Sig: TAKE 0.5 TABLETS (5 MG TOTAL) BY MOUTH 2 (TWO) TIMES DAILY AS NEEDED FOR MUSCLE SPASMS.     Not Delegated - Analgesics:  Muscle Relaxants Failed - 03/26/2021  2:12 PM      Failed - This refill cannot be delegated      Passed - Valid encounter within last 6 months    Recent Outpatient Visits           1 month ago Type 2 diabetes mellitus with morbid obesity ([place])   [place], [place] T, NP   4 months ago Coughing up blood   [place], Santiago Glad, NP   5 months ago Type 2 diabetes mellitus with morbid obesity ([place])   [place], [place] T, NP   1 year ago Type 2 diabetes mellitus with morbid obesity ([place])   [place] Cannady, [name], NP   1 year ago Paranoid schizophrenia ([place])   [place], [name], NP       Future Appointments             In 1 month Cannady, [name], NP [facility]

## 2021-03-29 DIAGNOSIS — I10 Essential (primary) hypertension: Secondary | ICD-10-CM | POA: Diagnosis not present

## 2021-03-29 DIAGNOSIS — Z791 Long term (current) use of non-steroidal anti-inflammatories (NSAID): Secondary | ICD-10-CM | POA: Diagnosis not present

## 2021-03-29 DIAGNOSIS — Z9181 History of falling: Secondary | ICD-10-CM | POA: Diagnosis not present

## 2021-03-29 DIAGNOSIS — Z89201 Acquired absence of right upper limb, unspecified level: Secondary | ICD-10-CM | POA: Diagnosis not present

## 2021-03-29 DIAGNOSIS — E785 Hyperlipidemia, unspecified: Secondary | ICD-10-CM | POA: Diagnosis not present

## 2021-03-29 DIAGNOSIS — M48061 Spinal stenosis, lumbar region without neurogenic claudication: Secondary | ICD-10-CM | POA: Diagnosis not present

## 2021-03-29 DIAGNOSIS — Z87891 Personal history of nicotine dependence: Secondary | ICD-10-CM | POA: Diagnosis not present

## 2021-03-29 DIAGNOSIS — R293 Abnormal posture: Secondary | ICD-10-CM | POA: Diagnosis not present

## 2021-03-29 DIAGNOSIS — E1142 Type 2 diabetes mellitus with diabetic polyneuropathy: Secondary | ICD-10-CM | POA: Diagnosis not present

## 2021-03-29 DIAGNOSIS — G8929 Other chronic pain: Secondary | ICD-10-CM | POA: Diagnosis not present

## 2021-03-29 DIAGNOSIS — E1169 Type 2 diabetes mellitus with other specified complication: Secondary | ICD-10-CM | POA: Diagnosis not present

## 2021-03-29 DIAGNOSIS — M5136 Other intervertebral disc degeneration, lumbar region: Secondary | ICD-10-CM | POA: Diagnosis not present

## 2021-03-29 DIAGNOSIS — E041 Nontoxic single thyroid nodule: Secondary | ICD-10-CM | POA: Diagnosis not present

## 2021-03-29 DIAGNOSIS — Z7984 Long term (current) use of oral hypoglycemic drugs: Secondary | ICD-10-CM | POA: Diagnosis not present

## 2021-04-02 DIAGNOSIS — Z791 Long term (current) use of non-steroidal anti-inflammatories (NSAID): Secondary | ICD-10-CM | POA: Diagnosis not present

## 2021-04-02 DIAGNOSIS — E1142 Type 2 diabetes mellitus with diabetic polyneuropathy: Secondary | ICD-10-CM | POA: Diagnosis not present

## 2021-04-02 DIAGNOSIS — Z89201 Acquired absence of right upper limb, unspecified level: Secondary | ICD-10-CM | POA: Diagnosis not present

## 2021-04-02 DIAGNOSIS — Z7984 Long term (current) use of oral hypoglycemic drugs: Secondary | ICD-10-CM | POA: Diagnosis not present

## 2021-04-02 DIAGNOSIS — Z87891 Personal history of nicotine dependence: Secondary | ICD-10-CM | POA: Diagnosis not present

## 2021-04-02 DIAGNOSIS — E1169 Type 2 diabetes mellitus with other specified complication: Secondary | ICD-10-CM | POA: Diagnosis not present

## 2021-04-02 DIAGNOSIS — R293 Abnormal posture: Secondary | ICD-10-CM | POA: Diagnosis not present

## 2021-04-02 DIAGNOSIS — E041 Nontoxic single thyroid nodule: Secondary | ICD-10-CM | POA: Diagnosis not present

## 2021-04-02 DIAGNOSIS — M48061 Spinal stenosis, lumbar region without neurogenic claudication: Secondary | ICD-10-CM | POA: Diagnosis not present

## 2021-04-02 DIAGNOSIS — E785 Hyperlipidemia, unspecified: Secondary | ICD-10-CM | POA: Diagnosis not present

## 2021-04-02 DIAGNOSIS — G8929 Other chronic pain: Secondary | ICD-10-CM | POA: Diagnosis not present

## 2021-04-02 DIAGNOSIS — M5136 Other intervertebral disc degeneration, lumbar region: Secondary | ICD-10-CM | POA: Diagnosis not present

## 2021-04-02 DIAGNOSIS — I10 Essential (primary) hypertension: Secondary | ICD-10-CM | POA: Diagnosis not present

## 2021-04-02 DIAGNOSIS — Z9181 History of falling: Secondary | ICD-10-CM | POA: Diagnosis not present

## 2021-04-04 DIAGNOSIS — Z791 Long term (current) use of non-steroidal anti-inflammatories (NSAID): Secondary | ICD-10-CM | POA: Diagnosis not present

## 2021-04-04 DIAGNOSIS — R293 Abnormal posture: Secondary | ICD-10-CM | POA: Diagnosis not present

## 2021-04-04 DIAGNOSIS — E1142 Type 2 diabetes mellitus with diabetic polyneuropathy: Secondary | ICD-10-CM | POA: Diagnosis not present

## 2021-04-04 DIAGNOSIS — Z89201 Acquired absence of right upper limb, unspecified level: Secondary | ICD-10-CM | POA: Diagnosis not present

## 2021-04-04 DIAGNOSIS — M5136 Other intervertebral disc degeneration, lumbar region: Secondary | ICD-10-CM | POA: Diagnosis not present

## 2021-04-04 DIAGNOSIS — E041 Nontoxic single thyroid nodule: Secondary | ICD-10-CM | POA: Diagnosis not present

## 2021-04-04 DIAGNOSIS — Z9181 History of falling: Secondary | ICD-10-CM | POA: Diagnosis not present

## 2021-04-04 DIAGNOSIS — Z87891 Personal history of nicotine dependence: Secondary | ICD-10-CM | POA: Diagnosis not present

## 2021-04-04 DIAGNOSIS — Z7984 Long term (current) use of oral hypoglycemic drugs: Secondary | ICD-10-CM | POA: Diagnosis not present

## 2021-04-04 DIAGNOSIS — M48061 Spinal stenosis, lumbar region without neurogenic claudication: Secondary | ICD-10-CM | POA: Diagnosis not present

## 2021-04-04 DIAGNOSIS — I10 Essential (primary) hypertension: Secondary | ICD-10-CM | POA: Diagnosis not present

## 2021-04-04 DIAGNOSIS — G8929 Other chronic pain: Secondary | ICD-10-CM | POA: Diagnosis not present

## 2021-04-04 DIAGNOSIS — E785 Hyperlipidemia, unspecified: Secondary | ICD-10-CM | POA: Diagnosis not present

## 2021-04-04 DIAGNOSIS — E1169 Type 2 diabetes mellitus with other specified complication: Secondary | ICD-10-CM | POA: Diagnosis not present

## 2021-04-09 DIAGNOSIS — M48061 Spinal stenosis, lumbar region without neurogenic claudication: Secondary | ICD-10-CM | POA: Diagnosis not present

## 2021-04-09 DIAGNOSIS — E041 Nontoxic single thyroid nodule: Secondary | ICD-10-CM | POA: Diagnosis not present

## 2021-04-09 DIAGNOSIS — Z89201 Acquired absence of right upper limb, unspecified level: Secondary | ICD-10-CM | POA: Diagnosis not present

## 2021-04-09 DIAGNOSIS — I10 Essential (primary) hypertension: Secondary | ICD-10-CM | POA: Diagnosis not present

## 2021-04-09 DIAGNOSIS — Z9181 History of falling: Secondary | ICD-10-CM | POA: Diagnosis not present

## 2021-04-09 DIAGNOSIS — Z791 Long term (current) use of non-steroidal anti-inflammatories (NSAID): Secondary | ICD-10-CM | POA: Diagnosis not present

## 2021-04-09 DIAGNOSIS — M5136 Other intervertebral disc degeneration, lumbar region: Secondary | ICD-10-CM | POA: Diagnosis not present

## 2021-04-09 DIAGNOSIS — R293 Abnormal posture: Secondary | ICD-10-CM | POA: Diagnosis not present

## 2021-04-09 DIAGNOSIS — E785 Hyperlipidemia, unspecified: Secondary | ICD-10-CM | POA: Diagnosis not present

## 2021-04-09 DIAGNOSIS — E1169 Type 2 diabetes mellitus with other specified complication: Secondary | ICD-10-CM | POA: Diagnosis not present

## 2021-04-09 DIAGNOSIS — E1142 Type 2 diabetes mellitus with diabetic polyneuropathy: Secondary | ICD-10-CM | POA: Diagnosis not present

## 2021-04-09 DIAGNOSIS — Z87891 Personal history of nicotine dependence: Secondary | ICD-10-CM | POA: Diagnosis not present

## 2021-04-09 DIAGNOSIS — G8929 Other chronic pain: Secondary | ICD-10-CM | POA: Diagnosis not present

## 2021-04-09 DIAGNOSIS — Z7984 Long term (current) use of oral hypoglycemic drugs: Secondary | ICD-10-CM | POA: Diagnosis not present

## 2021-04-11 ENCOUNTER — Telehealth: Payer: Self-pay | Admitting: Nurse Practitioner

## 2021-04-11 DIAGNOSIS — Z87891 Personal history of nicotine dependence: Secondary | ICD-10-CM | POA: Diagnosis not present

## 2021-04-11 DIAGNOSIS — G8929 Other chronic pain: Secondary | ICD-10-CM | POA: Diagnosis not present

## 2021-04-11 DIAGNOSIS — Z89201 Acquired absence of right upper limb, unspecified level: Secondary | ICD-10-CM | POA: Diagnosis not present

## 2021-04-11 DIAGNOSIS — E1169 Type 2 diabetes mellitus with other specified complication: Secondary | ICD-10-CM | POA: Diagnosis not present

## 2021-04-11 DIAGNOSIS — R293 Abnormal posture: Secondary | ICD-10-CM | POA: Diagnosis not present

## 2021-04-11 DIAGNOSIS — I10 Essential (primary) hypertension: Secondary | ICD-10-CM | POA: Diagnosis not present

## 2021-04-11 DIAGNOSIS — Z9181 History of falling: Secondary | ICD-10-CM | POA: Diagnosis not present

## 2021-04-11 DIAGNOSIS — E1142 Type 2 diabetes mellitus with diabetic polyneuropathy: Secondary | ICD-10-CM | POA: Diagnosis not present

## 2021-04-11 DIAGNOSIS — M5136 Other intervertebral disc degeneration, lumbar region: Secondary | ICD-10-CM | POA: Diagnosis not present

## 2021-04-11 DIAGNOSIS — M48061 Spinal stenosis, lumbar region without neurogenic claudication: Secondary | ICD-10-CM | POA: Diagnosis not present

## 2021-04-11 DIAGNOSIS — E785 Hyperlipidemia, unspecified: Secondary | ICD-10-CM | POA: Diagnosis not present

## 2021-04-11 DIAGNOSIS — Z7984 Long term (current) use of oral hypoglycemic drugs: Secondary | ICD-10-CM | POA: Diagnosis not present

## 2021-04-11 DIAGNOSIS — Z791 Long term (current) use of non-steroidal anti-inflammatories (NSAID): Secondary | ICD-10-CM | POA: Diagnosis not present

## 2021-04-11 DIAGNOSIS — E041 Nontoxic single thyroid nodule: Secondary | ICD-10-CM | POA: Diagnosis not present

## 2021-04-11 NOTE — Telephone Encounter (Signed)
Marcello Moores calling from [facility] stated that pt fell yesterday and was uninjured. Stated he needed to document for PCP.   Please advice.

## 2021-04-11 NOTE — Telephone Encounter (Signed)
FYI to provider

## 2021-04-11 NOTE — Telephone Encounter (Signed)
Noted, will monitor.

## 2021-04-17 ENCOUNTER — Ambulatory Visit (INDEPENDENT_AMBULATORY_CARE_PROVIDER_SITE_OTHER): Payer: Medicare Other | Admitting: *Deleted

## 2021-04-17 ENCOUNTER — Telehealth: Payer: Self-pay | Admitting: Nurse Practitioner

## 2021-04-17 DIAGNOSIS — Z0000 Encounter for general adult medical examination without abnormal findings: Secondary | ICD-10-CM | POA: Diagnosis not present
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
## 2021-04-17 NOTE — Telephone Encounter (Signed)
Copied from [place] [id number]. Topic: General - Other >> Apr 17, 2021 12:26 PM Tessa Lerner A wrote: Reason for CRM: The patient would like to be contacted by [facility]. Cannady when possible to discuss their medications  The patient shares that they've reached out previously regarding their concerns and would like to discuss them further   Please contact when available

## 2021-04-18 NOTE — Telephone Encounter (Signed)
Spoke with pt and he stated he was fine with discussing his concerns at his appt in December.

## 2021-04-20 DIAGNOSIS — I10 Essential (primary) hypertension: Secondary | ICD-10-CM | POA: Diagnosis not present

## 2021-04-20 DIAGNOSIS — R293 Abnormal posture: Secondary | ICD-10-CM | POA: Diagnosis not present

## 2021-04-20 DIAGNOSIS — M5136 Other intervertebral disc degeneration, lumbar region: Secondary | ICD-10-CM | POA: Diagnosis not present

## 2021-04-20 DIAGNOSIS — Z791 Long term (current) use of non-steroidal anti-inflammatories (NSAID): Secondary | ICD-10-CM | POA: Diagnosis not present

## 2021-04-20 DIAGNOSIS — M48061 Spinal stenosis, lumbar region without neurogenic claudication: Secondary | ICD-10-CM | POA: Diagnosis not present

## 2021-04-20 DIAGNOSIS — Z7984 Long term (current) use of oral hypoglycemic drugs: Secondary | ICD-10-CM | POA: Diagnosis not present

## 2021-04-20 DIAGNOSIS — G8929 Other chronic pain: Secondary | ICD-10-CM | POA: Diagnosis not present

## 2021-04-20 DIAGNOSIS — E041 Nontoxic single thyroid nodule: Secondary | ICD-10-CM | POA: Diagnosis not present

## 2021-04-20 DIAGNOSIS — E1169 Type 2 diabetes mellitus with other specified complication: Secondary | ICD-10-CM | POA: Diagnosis not present

## 2021-04-20 DIAGNOSIS — Z87891 Personal history of nicotine dependence: Secondary | ICD-10-CM | POA: Diagnosis not present

## 2021-04-20 DIAGNOSIS — Z89201 Acquired absence of right upper limb, unspecified level: Secondary | ICD-10-CM | POA: Diagnosis not present

## 2021-04-20 DIAGNOSIS — E785 Hyperlipidemia, unspecified: Secondary | ICD-10-CM | POA: Diagnosis not present

## 2021-04-20 DIAGNOSIS — Z9181 History of falling: Secondary | ICD-10-CM | POA: Diagnosis not present

## 2021-04-20 DIAGNOSIS — E1142 Type 2 diabetes mellitus with diabetic polyneuropathy: Secondary | ICD-10-CM | POA: Diagnosis not present

## 2021-05-15 ENCOUNTER — Ambulatory Visit: Payer: Medicare Other | Admitting: Nurse Practitioner

## 2021-05-20 DEATH — deceased
